# Patient Record
Sex: Female | Born: 1975 | Race: White | Hispanic: No | Marital: Married | Smoking: Never smoker
Health system: Southern US, Community
[De-identification: ages and names within clinical notes are randomized; demographics above are authoritative.]

## PROBLEM LIST (undated history)

## (undated) DIAGNOSIS — C801 Malignant (primary) neoplasm, unspecified: Secondary | ICD-10-CM

## (undated) DIAGNOSIS — E669 Obesity, unspecified: Secondary | ICD-10-CM

## (undated) DIAGNOSIS — Z923 Personal history of irradiation: Secondary | ICD-10-CM

## (undated) DIAGNOSIS — E079 Disorder of thyroid, unspecified: Secondary | ICD-10-CM

## (undated) DIAGNOSIS — C73 Malignant neoplasm of thyroid gland: Secondary | ICD-10-CM

## (undated) DIAGNOSIS — R5383 Other fatigue: Secondary | ICD-10-CM

## (undated) DIAGNOSIS — N841 Polyp of cervix uteri: Secondary | ICD-10-CM

## (undated) DIAGNOSIS — R001 Bradycardia, unspecified: Secondary | ICD-10-CM

## (undated) DIAGNOSIS — E890 Postprocedural hypothyroidism: Secondary | ICD-10-CM

## (undated) HISTORY — DX: Disorder of thyroid, unspecified: E07.9

## (undated) HISTORY — PX: TOTAL THYROIDECTOMY: SHX2547

## (undated) HISTORY — DX: Malignant (primary) neoplasm, unspecified: C80.1

## (undated) HISTORY — DX: Obesity, unspecified: E66.9

## (undated) HISTORY — DX: Malignant neoplasm of thyroid gland: C73

## (undated) HISTORY — DX: Polyp of cervix uteri: N84.1

## (undated) HISTORY — DX: Bradycardia, unspecified: R00.1

## (undated) HISTORY — DX: Personal history of irradiation: Z92.3

## (undated) HISTORY — DX: Postprocedural hypothyroidism: E89.0

## (undated) HISTORY — DX: Other fatigue: R53.83

---

## 1999-12-15 ENCOUNTER — Other Ambulatory Visit: Admission: RE | Admit: 1999-12-15 | Discharge: 1999-12-15 | Payer: Self-pay | Admitting: Obstetrics & Gynecology

## 2001-01-08 ENCOUNTER — Other Ambulatory Visit: Admission: RE | Admit: 2001-01-08 | Discharge: 2001-01-08 | Payer: Self-pay | Admitting: Obstetrics & Gynecology

## 2002-02-20 ENCOUNTER — Other Ambulatory Visit: Admission: RE | Admit: 2002-02-20 | Discharge: 2002-02-20 | Payer: Self-pay | Admitting: Obstetrics & Gynecology

## 2003-02-24 ENCOUNTER — Other Ambulatory Visit: Admission: RE | Admit: 2003-02-24 | Discharge: 2003-02-24 | Payer: Self-pay | Admitting: Obstetrics & Gynecology

## 2008-01-17 ENCOUNTER — Ambulatory Visit: Payer: Self-pay | Admitting: Gastroenterology

## 2008-03-06 ENCOUNTER — Ambulatory Visit (HOSPITAL_COMMUNITY): Admission: RE | Admit: 2008-03-06 | Discharge: 2008-03-06 | Payer: Self-pay | Admitting: Gastroenterology

## 2008-03-06 ENCOUNTER — Ambulatory Visit: Payer: Self-pay | Admitting: Gastroenterology

## 2009-01-25 ENCOUNTER — Ambulatory Visit (HOSPITAL_COMMUNITY): Admission: RE | Admit: 2009-01-25 | Discharge: 2009-01-25 | Payer: Self-pay

## 2009-02-22 ENCOUNTER — Ambulatory Visit (HOSPITAL_COMMUNITY): Admission: RE | Admit: 2009-02-22 | Discharge: 2009-02-22 | Payer: Self-pay | Admitting: General Surgery

## 2009-02-22 ENCOUNTER — Encounter (INDEPENDENT_AMBULATORY_CARE_PROVIDER_SITE_OTHER): Payer: Self-pay | Admitting: Interventional Radiology

## 2010-01-26 ENCOUNTER — Ambulatory Visit (HOSPITAL_COMMUNITY): Admission: RE | Admit: 2010-01-26 | Discharge: 2010-01-26 | Payer: Self-pay | Admitting: General Surgery

## 2010-03-15 ENCOUNTER — Encounter: Admission: RE | Admit: 2010-03-15 | Discharge: 2010-03-15 | Payer: Self-pay | Admitting: General Surgery

## 2010-03-15 ENCOUNTER — Other Ambulatory Visit: Admission: RE | Admit: 2010-03-15 | Discharge: 2010-03-15 | Payer: Self-pay | Admitting: Interventional Radiology

## 2010-05-19 ENCOUNTER — Ambulatory Visit (HOSPITAL_COMMUNITY)
Admission: RE | Admit: 2010-05-19 | Discharge: 2010-05-20 | Payer: Self-pay | Source: Home / Self Care | Attending: General Surgery | Admitting: General Surgery

## 2010-05-19 ENCOUNTER — Encounter (INDEPENDENT_AMBULATORY_CARE_PROVIDER_SITE_OTHER): Payer: Self-pay | Admitting: General Surgery

## 2010-05-27 ENCOUNTER — Ambulatory Visit: Payer: Self-pay | Admitting: "Endocrinology

## 2010-06-03 ENCOUNTER — Ambulatory Visit (HOSPITAL_COMMUNITY)
Admission: RE | Admit: 2010-06-03 | Discharge: 2010-06-03 | Payer: Self-pay | Source: Home / Self Care | Attending: "Endocrinology | Admitting: "Endocrinology

## 2010-06-14 ENCOUNTER — Ambulatory Visit
Admission: RE | Admit: 2010-06-14 | Discharge: 2010-06-14 | Payer: Self-pay | Source: Home / Self Care | Attending: "Endocrinology | Admitting: "Endocrinology

## 2010-06-21 ENCOUNTER — Encounter (HOSPITAL_COMMUNITY)
Admission: RE | Admit: 2010-06-21 | Discharge: 2010-07-12 | Payer: Self-pay | Source: Home / Self Care | Attending: "Endocrinology | Admitting: "Endocrinology

## 2010-06-27 ENCOUNTER — Ambulatory Visit (HOSPITAL_COMMUNITY)
Admission: RE | Admit: 2010-06-27 | Discharge: 2010-06-27 | Payer: Self-pay | Source: Home / Self Care | Attending: "Endocrinology | Admitting: "Endocrinology

## 2010-06-27 LAB — HCG, SERUM, QUALITATIVE: Preg, Serum: NEGATIVE

## 2010-06-29 LAB — HCG, SERUM, QUALITATIVE: Preg, Serum: NEGATIVE

## 2010-07-06 ENCOUNTER — Encounter (HOSPITAL_COMMUNITY)
Admission: RE | Admit: 2010-07-06 | Discharge: 2010-07-12 | Payer: Self-pay | Source: Home / Self Care | Attending: "Endocrinology | Admitting: "Endocrinology

## 2010-07-13 ENCOUNTER — Encounter: Payer: Self-pay | Admitting: "Endocrinology

## 2010-07-20 ENCOUNTER — Ambulatory Visit (INDEPENDENT_AMBULATORY_CARE_PROVIDER_SITE_OTHER): Payer: Commercial Managed Care - PPO | Admitting: "Endocrinology

## 2010-07-20 DIAGNOSIS — R5381 Other malaise: Secondary | ICD-10-CM

## 2010-07-20 DIAGNOSIS — C73 Malignant neoplasm of thyroid gland: Secondary | ICD-10-CM

## 2010-07-20 DIAGNOSIS — I498 Other specified cardiac arrhythmias: Secondary | ICD-10-CM

## 2010-07-20 DIAGNOSIS — E890 Postprocedural hypothyroidism: Secondary | ICD-10-CM

## 2010-08-23 LAB — POCT I-STAT 4, (NA,K, GLUC, HGB,HCT)
Glucose, Bld: 90 mg/dL (ref 70–99)
HCT: 44 % (ref 36.0–46.0)
Hemoglobin: 15 g/dL (ref 12.0–15.0)
Potassium: 3.6 mEq/L (ref 3.5–5.1)
Sodium: 141 mEq/L (ref 135–145)

## 2010-08-23 LAB — SURGICAL PCR SCREEN
MRSA, PCR: NEGATIVE
Staphylococcus aureus: NEGATIVE

## 2010-08-23 LAB — COMPREHENSIVE METABOLIC PANEL
ALT: 12 U/L (ref 0–35)
AST: 18 U/L (ref 0–37)
Albumin: 4 g/dL (ref 3.5–5.2)
Alkaline Phosphatase: 75 U/L (ref 39–117)
BUN: 7 mg/dL (ref 6–23)
CO2: 27 mEq/L (ref 19–32)
Calcium: 9.3 mg/dL (ref 8.4–10.5)
Chloride: 105 mEq/L (ref 96–112)
Creatinine, Ser: 0.85 mg/dL (ref 0.4–1.2)
GFR calc Af Amer: >60 mL/min (ref >60)
GFR calc non Af Amer: >60 mL/min (ref >60)
Glucose, Bld: 80 mg/dL (ref 70–99)
Potassium: 3.9 mEq/L (ref 3.5–5.1)
Sodium: 138 mEq/L (ref 135–145)
Total Bilirubin: 0.7 mg/dL (ref 0.3–1.2)
Total Protein: 7.6 g/dL (ref 6.0–8.3)

## 2010-08-23 LAB — PREGNANCY, URINE: Preg Test, Ur: NEGATIVE

## 2010-08-23 LAB — CALCIUM: Calcium: 9.1 mg/dL (ref 8.4–10.5)

## 2010-10-03 ENCOUNTER — Encounter: Payer: Self-pay | Admitting: *Deleted

## 2010-10-03 ENCOUNTER — Other Ambulatory Visit: Payer: Self-pay | Admitting: *Deleted

## 2010-10-03 DIAGNOSIS — C73 Malignant neoplasm of thyroid gland: Secondary | ICD-10-CM

## 2010-10-03 DIAGNOSIS — E890 Postprocedural hypothyroidism: Secondary | ICD-10-CM

## 2010-10-19 ENCOUNTER — Encounter (INDEPENDENT_AMBULATORY_CARE_PROVIDER_SITE_OTHER): Payer: Self-pay | Admitting: General Surgery

## 2010-10-25 ENCOUNTER — Encounter: Payer: Self-pay | Admitting: "Endocrinology

## 2010-10-25 ENCOUNTER — Ambulatory Visit (INDEPENDENT_AMBULATORY_CARE_PROVIDER_SITE_OTHER): Payer: Commercial Managed Care - PPO | Admitting: "Endocrinology

## 2010-10-25 VITALS — BP 113/74 | HR 60 | Wt 188.1 lb

## 2010-10-25 DIAGNOSIS — R5383 Other fatigue: Secondary | ICD-10-CM | POA: Insufficient documentation

## 2010-10-25 DIAGNOSIS — E890 Postprocedural hypothyroidism: Secondary | ICD-10-CM | POA: Insufficient documentation

## 2010-10-25 DIAGNOSIS — R5381 Other malaise: Secondary | ICD-10-CM

## 2010-10-25 DIAGNOSIS — R001 Bradycardia, unspecified: Secondary | ICD-10-CM | POA: Insufficient documentation

## 2010-10-25 DIAGNOSIS — I498 Other specified cardiac arrhythmias: Secondary | ICD-10-CM

## 2010-10-25 DIAGNOSIS — C73 Malignant neoplasm of thyroid gland: Secondary | ICD-10-CM

## 2010-10-25 DIAGNOSIS — E669 Obesity, unspecified: Secondary | ICD-10-CM

## 2010-10-25 NOTE — Patient Instructions (Signed)
Please have thyroid tests and thyroglobulin panel done about two weeks prior to next visit. Please continue Synthroid at current dose.

## 2010-10-25 NOTE — Progress Notes (Signed)
CC: FU papillary thyroid cancer, post-operative hypothyroidism, iatrogenic hyperthyroidism, fatigue, bradycardia, overweight/obesity  A. HPI: Betty Reyes is a 35 y.o. white woman who is accompanied by her husband. 1. Betty Reyes was diagnosed with hypothyroidism, presumably secondary to Hashimoto's Disease, in the past. She was started on Synthroid, 50 mcg per day and remained on that dose through 2011. 2. In the Summer of 2010 the nurse practioner for her PCP, Dr. Konrad Felix, noted a goiter. A thyroid US showed multiple nodules bilaterally, with the largest being 2.6 cm in diameter. Betty Reyes was referred to Dr. Almond Lint, a general surgeon, for further evaluation and management. Dr. Donell Beers performed a fine needle aspiration, which reportedly showed Hashimoto's Disease. A follow-up thyroid US in the Summer of 2011 showed that the previousl aspirated nodule and two other nodules had enlarged during the year. Dr. Donell Beers performed a total thyroidectomy on 12.18.11. 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 The largest microfocus was in the superior right lobe and did extend through the capsule.   3. 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 I met with her and her husband, reviewed her pathology, Dr. Arita Miss notes, and Betty Reyes' [place] reports and fine needle aspirate reports, and examined her. We also reviewed the latest clinical guidelines from the American Thyroid Association. We discussed the options for therapy, including whether or not to give radioactive iodine therapy (RAI). Although microfocal cancers usually have a low risk of recurrence or death, her case had two factors that would increase her risks.  First, the fact that she had multiple foci in both lobes. Second, the fact that one microfocus did extend through the thyroid capsule and presumably had drectly spread locally into her neck. I asked her to take oral contraceptives from that point until she either decided not to take RAI or until at least one year after receiving RAI 4. I then contacted Dr. Fuller Plan, MD, the chief of Endocrinology at the [facility], in [place], [place]. Dr. Cherly Anderson was my former Chief of Endocrinology at the [facility] and is a Contractor on thyroid cancer.  When I reviewed the case with him, Dr. Cherly Anderson agreed that the two factors mentioned above did increase her risk of recurrence. He stated that while some experts would suggest not doing RAI, other experts would suggest giving RAI at a dose of 50 millicuries.  I then met with Betty Reyes and her husband and we had a full and open discussion about the advantages and disadvantages of using RAI. Since the Hennises do not yet have children, and since neither of them wanted to leave a cancer untreated when a potentially curable treatment such as RAI was available,they wanted to proceed with RAI as soon as possible. Since Betty Reyes had not been on thyroid hormone replacement since surgery, she chose to receive RAI while undergoing thyroid hormone withdrawal and while following a strict low iodine diet. On 01.16.12, Betty Reyes received 50 millicuries of I-131. Her post-RAI scan showed uptake in the thyroid bed, but no spread outside the thyroid bed. As I explained to the Hennises, the I-131 uptake could have been  due to the presence of some residual thyroid cells, of some thyroid cancer cells, or both.  5. Betty Reyes' last clinic visit was on 02.18.12. At that time she had no evidence on physical exam of any residual cancer. In the interim she has continued to take Synthroid at a dose of 175 mcg per day, a dose that was adequately  suppressing her TSH.  6. PROS: Constitutional. The patient feels fine, is healthy overall, and has no significant complaints that pertain to today's visit. Energy: Energy level is good overall. Sleep: The patient usually sleeps well. There are no significant complaints of insomnia, frequent awakening, unusual restlessness, or poor sleep quality.  Body temperature: The patient's body temperature seems to be normal overall.  Weight: Weight has remained stable. There are no significant problems with unusual weight gain or loss. Eyes: The patient's vision is good. She is not aware of any eye problems. Neck: The patient is not aware of any lumps or masses in her anterior neck and thyroid bed. She does note occasional soreness at her surgical scar. There have been no significant other problems with  pressure, discomfort, or difficulty swallowing. Heart: The patient feels the expected increase in heart rate during exercise or other physical activities. There have been no significant problems with palpitations, irregular heart beats, chest pain, or chest pressure. Gastrointestinal: Stomach and intestines seem to be working normally. Bbwel movements are normal. There are no significant complaints of excessive hunger, acid reflux, upset stomach, stomach aches or pains, diarrhea, or constipation. Musculoskeletal: Muscles and extremities appear to be working normally. There are no significant problems with hand tremor, sweaty palms, palmar erythema, or lower leg swelling. Psychological: Mood and psychological responses seem to be normal. There have been no significant problems with sadness, depression, irritability, anger, or inappropriate responses to the actions of others. Mental: The patient's abilities to think, to pay attention, to remember, and to make decisions have normalized since being on Synthroid therapy. GYN: She remains on OCPs. Her LMP was about April 25th.  PMFSH: 1. Has resumed working  full-time as a Orthoptist. 2. Walks her dog regularly and does house work for exercise. 3. Will take a vacation in late may.  3. ROS: Betty Reyes had no significant problems related to any of her other eleven body systems  PHYSICAL EXAM: BP 113/74  Pulse 60  Wt 188 lb 1.6 oz (85.322 kg)  LMP 10/05/2010 Constitutional: The patient looks healthy and appears physically and emotionally well.  Eyes: There is no arcus or proptosis.  Mouth: The oropharynx appears normal. The tongue appears normal. There is normal oral moisture. There is no obvious gingivitis. Neck: There are no bruits present. The thyroid gland is absent. Thee is some induration and scarring in the left side of her thyroid bed over the strap muscle, but no evidence of masses or nodes in the thyroid bed or supraclavicular areas. There is no tenderness to palpation. Lungs: The lungs are clear. Air movement is good. Heart: The heart rhythm and rate appear normal. Heart sounds S1 and S2 are normal. I do not appreciate any pathologic heart murmurs. Abdomen: The abdominal size is enlarged, consistent with obesity. Bowel sounds are normal. The abdomen is soft and non-tender. There is no obviously palpable hepatomegaly, splenomegaly, or other masses.  Arms: Muscle mass appears appropriate for age.  Hands: There is no obvious tremor. Phalangeal and metacarpophalangeal joints appear normal. Palms are normal. Legs: Muscle mass appears appropriate for age. There is no edema.  Neurologic: Muscle strength is normal for age and gender  in both the upper and the lower extremities. Muscle tone appears normal. Sensation to touch is normal in the legs and feet.  Labs 03.19.12: Thyroglobulin (Tg) is < 0.2 (unmeasurable). Thyroglobulin antibody (TgAb) is < 20 (normal). TFTs show that her TSH is appropriately suppressed.  ASSESSMENT: 1. Papillary thyroid cancer: The most recent thyroglobulin concentration is below the detection range for this  sensitive assay. The normal TgAb indicates that there is nothing to interfere with the accuracy of the Tg assay. At this point it appears that Betty Reyes' PTC is in remission, but it is still possible that some PTC cells remain.  She will need continuing follow-up of her PTC for at least the next five years. 2. Hypothyroid: She remains on suppressive doses of Synthroid, which makes her hyperthyroid. Over time, if her physical exam and labs indicate that she continues to be in remission, we will slowly reduce the Synthroid dose. 3. Fatigue: The fatigue that she felt while undergoing thyroid hormone withdrawal was profound. She is essentially back to normal. 4. Bradycardia: This problem was due to profound hypothyroidism and has resolved as the thyroid hormone levels increased. 5. Obesity: With the resolution of her profound hypothyroidism she is beginning to exercise again. She wants to lose weight prior to becoming pregnant, something she wishes to do in 2013.   PLAN: 1. Fllow-up appointment in six months. 2. Obtain Tg panel and TFTs prior to next visit. 3. Continue Synthroid, 175 mcg/day.

## 2010-11-07 ENCOUNTER — Encounter: Payer: Self-pay | Admitting: "Endocrinology

## 2010-11-07 DIAGNOSIS — E669 Obesity, unspecified: Secondary | ICD-10-CM | POA: Insufficient documentation

## 2010-11-21 ENCOUNTER — Other Ambulatory Visit: Payer: Self-pay | Admitting: Obstetrics & Gynecology

## 2010-12-30 ENCOUNTER — Encounter (INDEPENDENT_AMBULATORY_CARE_PROVIDER_SITE_OTHER): Payer: Self-pay | Admitting: General Surgery

## 2011-01-02 ENCOUNTER — Encounter (INDEPENDENT_AMBULATORY_CARE_PROVIDER_SITE_OTHER): Payer: Self-pay | Admitting: General Surgery

## 2011-01-25 ENCOUNTER — Ambulatory Visit (INDEPENDENT_AMBULATORY_CARE_PROVIDER_SITE_OTHER): Payer: Commercial Managed Care - PPO | Admitting: General Surgery

## 2011-01-25 ENCOUNTER — Encounter (INDEPENDENT_AMBULATORY_CARE_PROVIDER_SITE_OTHER): Payer: Self-pay | Admitting: General Surgery

## 2011-01-25 DIAGNOSIS — C73 Malignant neoplasm of thyroid gland: Secondary | ICD-10-CM

## 2011-01-25 NOTE — Assessment & Plan Note (Signed)
Pt to see Dr. Fransico Michael in November and have labs before that. Plans to have uptake scan in Jan 2013. Would like to pursue childbearing in 2013. I will not order labs  I will follow her up in 1 year.

## 2011-01-25 NOTE — Progress Notes (Signed)
Betty Reyes is a 35 y.o. female.    Chief Complaint  Patient presents with  . Other    PO reck thy    HPI HPI Pt is doing well 8 months after total thyroidectomy.  Her voice is good.  She has undergone radioactive ablation.  She is not experiencing symptoms of hyperthyroidism or hypothyroidism.  She is back to work on [employer] at [employer].  She has no complaints about her incision.  She denies other symptoms.    Past Medical History  Diagnosis Date  . Thyroid disease     HYPOTHYROIDISM  . Cancer   . Thyroid cancer   . Hypothyroidism, postop   . Fatigue   . Bradycardia   . Obesity   . History of radioactive iodine thyroid ablation     Past Surgical History  Procedure Date  . Total thyroidectomy     Family History  Problem Relation Age of Onset  . Cancer Mother   . Hypertension Father   . Hyperlipidemia Father   . Heart disease Father     HEART ATTACK  . Cancer Brother   . Diabetes Maternal Grandfather   . Hypothyroidism Paternal Grandmother     Social History History  Substance Use Topics  . Smoking status: Never Smoker   . Smokeless tobacco: Never Used  . Alcohol Use: No    No Known Allergies  Current Outpatient Prescriptions  Medication Sig Dispense Refill  . levothyroxine (SYNTHROID, LEVOTHROID) 175 MCG tablet Take 175 mcg by mouth daily. Brand Name Synthroid Only       . Multiple Vitamin (MULTIVITAMIN) capsule Take 1 capsule by mouth daily.        Lorita Officer Triphasic (ORTHO TRI-CYCLEN LO PO) Take by mouth daily.        . Omega-3 Fatty Acids (FISH OIL PO) Take 1 tablet by mouth daily.          Review of Systems Review of Systems  All other systems reviewed and are negative.    Physical Exam Physical Exam  Constitutional: She is oriented to person, place, and time. She appears well-developed and well-nourished. No distress.  HENT:  Head: Atraumatic.  Mouth/Throat: No oropharyngeal exudate.  Eyes: Conjunctivae are normal. Pupils are  equal, round, and reactive to light. No scleral icterus.  Neck: Normal range of motion. Neck supple. No tracheal deviation present.       Collar incision fading nicely  Cardiovascular: Normal rate and intact distal pulses.   Respiratory: Effort normal. No respiratory distress. She exhibits no tenderness.  GI: Soft. She exhibits no distension and no mass. There is no tenderness. There is no rebound and no guarding.  Musculoskeletal: Normal range of motion. She exhibits no edema and no tenderness.  Lymphadenopathy:    She has no cervical adenopathy.  Neurological: She is alert and oriented to person, place, and time. Coordination normal.  Skin: Skin is warm and dry. No rash noted. She is not diaphoretic. No erythema. No pallor.       Sunburned   Psychiatric: She has a normal mood and affect. Her behavior is normal. Judgment and thought content normal.     There were no vitals taken for this visit.  Assessment/Plan Multifocal papillary thyroid cancer, s/p total thyroidectomy 05/2010 and Radioactive Iodine Ablation 06/2010 Pt to see Dr. Fransico Michael in November and have labs before that. Plans to have uptake scan in Jan 2013. Would like to pursue childbearing in 2013. I will not order labs  I will follow her up in 1 year.        Betty Reyes 01/25/2011, 1:50 PM

## 2011-02-20 ENCOUNTER — Other Ambulatory Visit: Payer: Self-pay | Admitting: "Endocrinology

## 2011-02-23 ENCOUNTER — Other Ambulatory Visit: Payer: Self-pay | Admitting: *Deleted

## 2011-04-05 LAB — TSH: TSH: 0.154 u[IU]/mL — ABNORMAL LOW (ref 0.350–4.500)

## 2011-04-05 LAB — T3, FREE: T3, Free: 3.1 pg/mL (ref 2.3–4.2)

## 2011-04-05 LAB — ANTI-THYROGLOBULIN ANTIBODY: Thyroglobulin Ab: <20 U/mL (ref <40.0)

## 2011-04-05 LAB — T4, FREE: Free T4: 1.57 ng/dL (ref 0.80–1.80)

## 2011-04-20 ENCOUNTER — Ambulatory Visit (INDEPENDENT_AMBULATORY_CARE_PROVIDER_SITE_OTHER): Payer: Commercial Managed Care - PPO | Admitting: "Endocrinology

## 2011-04-20 ENCOUNTER — Encounter: Payer: Self-pay | Admitting: "Endocrinology

## 2011-04-20 VITALS — BP 128/68 | HR 62 | Wt 188.9 lb

## 2011-04-20 DIAGNOSIS — E890 Postprocedural hypothyroidism: Secondary | ICD-10-CM

## 2011-04-20 DIAGNOSIS — C73 Malignant neoplasm of thyroid gland: Secondary | ICD-10-CM

## 2011-04-20 DIAGNOSIS — I498 Other specified cardiac arrhythmias: Secondary | ICD-10-CM

## 2011-04-20 DIAGNOSIS — E0580 Other thyrotoxicosis without thyrotoxic crisis or storm: Secondary | ICD-10-CM

## 2011-04-20 DIAGNOSIS — R001 Bradycardia, unspecified: Secondary | ICD-10-CM

## 2011-04-20 DIAGNOSIS — R5383 Other fatigue: Secondary | ICD-10-CM

## 2011-04-20 NOTE — Progress Notes (Addendum)
CC: FU papillary thyroid cancer, post-operative hypothyroidism, iatrogenic hyperthyroidism, fatigue, bradycardia, overweight/obesity  A. HPI: [name] is a 35 y.o. Caucasian woman who is accompanied by her husband. 1. [name] was diagnosed with hypothyroidism, presumably secondary to Hashimoto's Disease, in the past. She was treated with Synthroid, 50 mcg per day. In the Summer of 2010 a goiter was noted.  A thyroid US showed multiple nodules bilaterally, with the largest being 2.6 cm in diameter. [name] was referred to Dr. Almond Lint, a general surgeon, for further evaluation and management. Dr. Donell Reyes performed a fine needle aspiration, which reportedly showed Hashimoto's Disease. A follow-up thyroid US in the Summer of 2011 showed that the previousl aspirated nodule and two other nodules had enlarged during the year. Dr. Donell Reyes performed a total thyroidectomy on 12.18.11. 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 The largest microfocus was in the superior right lobe and did extend through the capsule.   2. I saw the patient in consultation on 12.16.11. Clinically she showed no evidence of active thyroid cancer. I then reviewed her surgical notes and pathology reports and the latest clinical guidelines on the management of thyroid cancer from the American Thyroid Assoc. with the patient and her husband. We discussed the options for therapy, including whether or not to give radioactive iodine therapy (RAI). Although microfocal cancers usually have a low risk of recurrence or death, her case had two factors that would increase her risks. First, the fact that she had multiple foci in both lobes. Second, the fact that one microfocus did extend through the thyroid capsule and presumably had drectly spread locally into her neck. I asked her to take oral contraceptives from that point until she either decided not to take RAI  or until at least one year after receiving RAI 3.  On 01.03.12 I met with [name] and her husband again and we had another full and open discussion about the advantages and disadvantages of using RAI. Since the Hennises do not yet have children, and since neither of them wanted to leave a cancer untreated when a potentially curable treatment such as RAI was available, they wanted to proceed with RAI as soon as possible. Since [name] had not been on thyroid hormone replacement since surgery, she chose to receive RAI while undergoing thyroid hormone withdrawal and while following a strict low iodine diet. On 01.16.12, [name] received 50 millicuries of I-131. Her post-RAI scan showed uptake in the thyroid bed, but no spread outside the thyroid bed. As I explained to the Betty Reyes, the I-131 uptake could have been due to the presence of some residual thyroid cells, or some thyroid cancer cells, or both.  4. The patient has done well clinically since her I-131 therapy. She has had no evidence of recurrence on physical exam .Her thyroglobulin (Tg) levels have progressively declined. On 12.21.11 the Tg was 20.8. On 01.03.12 the Tg was 2.9. She was hypothyroid for both of these Tg values. On 3.29.12 the Tg was <0.2. At that point [name] was taking Synthroid, 175 mcg/day. [name]' last clinic visit was on 05.15.12. In the interim she has been healthy. She remains on Synthroid, 175 mcg/day and oral contraception. 5. PROS: Constitutional. The patient feels "good". She has occasional headaches. She has no significant complaints that pertain to today's visit. Energy: Energy level is good overall. Sleep: The patient usually sleeps well. There are  no significant complaints of insomnia, frequent awakening, unusual restlessness, or poor sleep quality.  Body temperature: The patient's body temperature seems to be normal overall.  Weight: Weight has remained stable. There are no significant problems with  unusual weight gain or loss. Eyes: The patient's vision is good. She is not aware of any eye problems. Neck: The patient is not aware of any lumps or masses in her anterior neck and thyroid bed.  There have been no significant other problems with  pressure, discomfort, or difficulty swallowing. Heart: The patient feels the expected increase in heart rate during exercise or other physical activities. There have been no significant problems with palpitations, irregular heart beats, chest pain, or chest pressure. Gastrointestinal: Stomach and intestines seem to be working normally. Bbwel movements are normal. There are no significant complaints of excessive hunger, acid reflux, upset stomach, stomach aches or pains, diarrhea, or constipation. Musculoskeletal: Muscles and extremities appear to be working normally. There are no significant problems with hand tremor, sweaty palms, palmar erythema, or lower leg swelling. Psychological: Mood and psychological responses seem to be normal. There have been no significant problems with sadness, depression, irritability, anger, or inappropriate responses to the actions of others. Mental: The patient's abilities to think, to pay attention, to remember, and to make decisions have normalized since being on Synthroid therapy. GYN: She remains on OCPs. Her LMP is now.  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 2. Walks a lot with her husband. Goes to the park a lot. Walks her dog regularly. 3. Will take a vacation later this week..  3. ROS: [name] had no significant problems related to any of her other body systems  PHYSICAL EXAM: BP 128/68  Pulse 62  Wt 188 lb 15 oz (85.7 kg) Constitutional: The patient looks healthy and appears physically and emotionally well.  Eyes: There is no arcus or proptosis.  Mouth: The oropharynx appears normal. The tongue appears normal. There is normal oral moisture. There is no obvious gingivitis. Neck: There are no  bruits present. The thyroid gland is absent. There is some induration and scarring on both sides of her thyroid bed over the strap muscles, but less so than before. There is no evidence of masses or nodes in the thyroid bed or supraclavicular areas. There is no tenderness to palpation. Lungs: The lungs are clear. Air movement is good. Heart: The heart rhythm and rate appear normal. Heart sounds S1 and S2 are normal. I do not appreciate any pathologic heart murmurs. Abdomen: The abdominal size is enlarged, consistent with obesity. Bowel sounds are normal. The abdomen is soft and non-tender. There is no obviously palpable hepatomegaly, splenomegaly, or other masses.  Arms: Muscle mass appears appropriate for age.  Hands: There is no obvious tremor. Phalangeal and metacarpophalangeal joints appear normal. Palms are normal. Legs: Muscle mass appears appropriate for age. There is no edema.  Neurologic: Muscle strength is normal for age and gender  in both the upper and the lower extremities. Muscle tone appears normal. Sensation to touch is normal in the legs and feet.  Labs 10.23.12: No Thyroglobulin (Tg) is reported. Thyroglobulin antibody (TgAb) is < 20 (normal). TSH is 0.154, free T4 is 1.57, and free T3 is 3.1.  ASSESSMENT: 1. Papillary thyroid cancer: There is no clinical evidence of recurrence. Unfortunately, the lab did not do the Thyroglobulin assay that was requested. The most recent thyroglobulin concentration we have is from March. That Tg was below the detection range for this  sensitive assay. The normal TgAb in March and again in October indicates that there is nothing to interfere with the accuracy of the Tg assay. We will need to repeat the Tg assay today. At this point it appears that [name]' PTC is in remission, but it is still possible that some PTC cells remain.  She will need continuing follow-up of her PTC for at least the next five years. 2. Hypothyroid/hyperthyroid: She remains on  suppressive doses of Synthroid, which make her hyperthyroid. Over time, if her physical exam and labs indicate that she continues to be in remission, we will slowly reduce the Synthroid dose. 3. Fatigue: The fatigue that she felt while undergoing thyroid hormone withdrawal was profound. She is essentially back to normal. 4. Bradycardia: This problem was due to profound hypothyroidism and has resolved as the thyroid hormone levels increased. 5. Obesity: With the resolution of her profound hypothyroidism she is beginning to exercise again. She wants to lose weight prior to becoming pregnant, something she wishes to do in 2013.   PLAN: 1. Diagnostic: Repeat Tg today. 2. Therapeutic: Continue current dose of Synthroid. 3. Patient education: Patient and husband want to try for pregnancy in 2013. We need to repeat Tg unstimulated now. If OK, we want to do a thyrogen-stimulated Tg measurement in January.If that is also OK, then I will give patient clearance to stop OCPs and get on with bearing children.  4. Follow-up: Follow-up appointment in six months.  Level of Service: This visit lasted in excess of 40 minutes. More than 50% of the visit was devoted to counseling.  ADDENDUM 11.10.12: Thyroglobulin performed on 04/20/11 was again <0.2 (unmeasuarable). Tg antibody was again <20 (normal). There is no evidence of active thyroid cancer.

## 2011-04-20 NOTE — Patient Instructions (Signed)
Followup visit in 6 months. If you have not heard from [place] reference today's lab tests in 2 weeks, please call. We'll plan for her thyrogen-stimulated Tg measurement in January.

## 2011-04-21 LAB — THYROGLOBULIN LEVEL: Thyroglobulin Ab: <20 U/mL (ref <40.0)

## 2011-08-11 ENCOUNTER — Telehealth: Payer: Self-pay | Admitting: "Endocrinology

## 2011-08-11 DIAGNOSIS — C73 Malignant neoplasm of thyroid gland: Secondary | ICD-10-CM

## 2011-08-11 MED ORDER — THYROTROPIN ALFA 1.1 MG IM SOLR: 0.9000 mg | INTRAMUSCULAR | Status: DC

## 2011-08-11 NOTE — Telephone Encounter (Signed)
The patient contacted her pharmacy benefit manager, [employer] and was told that [employer] could ship Thyrogen to a local [facility]. Her co-pay would only be $80.00, without any other costs.  2.  That sounded too good to be true, so I called [employer], [phone] and spoke with Elnita Maxwell. Elnita Maxwell confirmed that the patient will have an $80.00 co-pay, but no deductible or other out of pocket expense. Elnita Maxwell approved the order of two vials of Thyrogen to be given by IM injections. She changed the dispensing site to the [facility]. 3. I called the [facility] and spoke to the pharmacist, Brett Canales. He confirmed that he could order the Thyrogen and will probably receive it early next week. I put the order into EPIC. I then called the [facility]. They had not yet received the e-prescription. I talked with Brett Canales again and gave him the prescription verbally. He thinks that the Thyrogen might come in as early as Wednesday, but perhaps not until Friday. I asked him to call me on my cell when the medication arrives.  4. I called the patient and gave her the above information.David Stall

## 2011-08-15 ENCOUNTER — Telehealth: Payer: Self-pay | Admitting: "Endocrinology

## 2011-08-15 NOTE — Telephone Encounter (Signed)
Patient sent me an email with several questions. I called her and answered the questions. 1. She should continue to take her Synthroid as usual during the time she is receiving Thyrogen injections and getting her labs drawn. 2. She can have another nurse administer the Thyrogen injections IM on Thursday morning, 3/7 and Friday morning, 3/8, ideally between 7-9 AM.  3. I will call the lab tomorrow to ensure that we order the full Tg panel, since there is some confusion about how to order that in EP[facility].  4. She will call us tomorrow with the fax number for the Peds Ward. We can then fax the lab slip to her.  5. Labs will be drawn Monday morning, 3/11 at [facility] lab at [facility] between 7-9 AM.

## 2011-08-16 ENCOUNTER — Other Ambulatory Visit: Payer: Self-pay | Admitting: *Deleted

## 2011-08-16 DIAGNOSIS — C73 Malignant neoplasm of thyroid gland: Secondary | ICD-10-CM

## 2011-08-22 LAB — T4, FREE: Free T4: 1.73 ng/dL (ref 0.80–1.80)

## 2011-08-22 LAB — T3, FREE: T3, Free: 3.3 pg/mL (ref 2.3–4.2)

## 2011-08-22 LAB — THYROGLOBULIN ANTIBODY: Thyroglobulin Ab: <20 U/mL

## 2011-08-22 LAB — THYROGLOBULIN LEVEL: Thyroglobulin: <0.2 ng/mL (ref 0.0–55.0)

## 2011-08-22 LAB — TSH: TSH: 17.234 u[IU]/mL — ABNORMAL HIGH (ref 0.350–4.500)

## 2011-08-24 ENCOUNTER — Telehealth: Payer: Self-pay | Admitting: "Endocrinology

## 2011-08-24 NOTE — Telephone Encounter (Signed)
I contacted the patient with the results of her  Thyrogen-stimulated thyroglobulin panel and TFTs drawn on 08/21/11. Free T4 and free T3 were normal as expected. The TSH was elevated at 17.234, consistent with a Thyrogen effect. Her thyroglobulin was less than 0.2 ng/mL, which is below the lower limit of detection of the assay. Her thyroglobulin antibody was less than 20 U/mL, which is also below the lower level of detection of that assay. The patient shows no evidence of residual thyroid cancer based on her clinical exams and on this Thyrogen-stimulated thyroglobulin panel. She is cleared to move forward with trying to become pregnant. Her follow-up appointment will be in May as planned. David Stall

## 2011-10-23 ENCOUNTER — Encounter: Payer: Self-pay | Admitting: "Endocrinology

## 2011-10-23 ENCOUNTER — Ambulatory Visit (INDEPENDENT_AMBULATORY_CARE_PROVIDER_SITE_OTHER): Payer: Commercial Managed Care - PPO | Admitting: "Endocrinology

## 2011-10-23 VITALS — BP 123/74 | HR 59 | Wt 189.2 lb

## 2011-10-23 DIAGNOSIS — R5381 Other malaise: Secondary | ICD-10-CM

## 2011-10-23 DIAGNOSIS — R5383 Other fatigue: Secondary | ICD-10-CM

## 2011-10-23 DIAGNOSIS — C73 Malignant neoplasm of thyroid gland: Secondary | ICD-10-CM

## 2011-10-23 DIAGNOSIS — E669 Obesity, unspecified: Secondary | ICD-10-CM

## 2011-10-23 DIAGNOSIS — E0580 Other thyrotoxicosis without thyrotoxic crisis or storm: Secondary | ICD-10-CM

## 2011-10-23 DIAGNOSIS — E890 Postprocedural hypothyroidism: Secondary | ICD-10-CM

## 2011-10-23 LAB — T4, FREE: Free T4: 1.54 ng/dL (ref 0.80–1.80)

## 2011-10-23 NOTE — Progress Notes (Signed)
CC: FU papillary thyroid cancer, post-operative hypothyroidism, iatrogenic hyperthyroidism, fatigue, bradycardia, overweight/obesity  A. HPI: Betty Reyes is a 36 y.o. Caucasian woman who is unaccompanied today. 1. Betty Reyes was diagnosed some years ago with hypothyroidism, presumably secondary to Hashimoto's Disease. She was treated with Synthroid, 50 mcg per day. In the Summer of 2010 a goiter was noted.  A thyroid US showed multiple nodules bilaterally, with the largest being 2.6 cm in diameter. Betty Reyes was referred to Dr. Almond Lint, a general surgeon, for further evaluation and management. Dr. Donell Beers performed a fine needle aspiration, which reportedly showed Hashimoto's Disease. A follow-up thyroid US in the Summer of 2011 showed that the previousl aspirated nodule and two other nodules had enlarged during the year. Dr. Donell Beers performed a total thyroidectomy on 12.18.11. 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 The largest microfocus was in the superior right lobe, measured 4 mm in diameter, invaded the capsule, but did not extend extrathyroidally.    2. I saw the patient and her husband in consultation on 12.16.11. Clinically she showed no evidence of active thyroid cancer. I then reviewed her surgical notes and pathology reports and the latest clinical guidelines on the management of thyroid cancer from the [employer] with the patient and her husband. We discussed the options for therapy, including whether or not to give radioactive iodine therapy (RAI). Although microfocal cancers usually have a low risk of recurrence or death, her case had two factors that would potentially increase her risks. First, the fact that she had multiple foci in both lobes. Second, the fact that one microfocus did invade the thyroid capsule and might have spread extrathyroidally, although the pathologist did not see  evidence of extrathyroidal extension on the slides he reviewed.  I asked the patient to take oral contraceptives from that point until she either decided not to take RAI or until at least one year after receiving RAI. She agreed. 3.  On 01.03.12 I met with Betty Reyes and her husband again and we had another full and open discussion about the advantages and disadvantages of using RAI. Since the Hennises do not yet have children, and since neither of them wanted to leave a cancer untreated when a potentially curable treatment such as RAI was available, they wanted to proceed with RAI as soon as possible. Since [name] had not been on thyroid hormone replacement since surgery, she chose to receive RAI while undergoing thyroid hormone withdrawal and while following a strict low iodine diet. On 01.16.12, [name] received 50 millicuries of I-131. Her post-RAI scan showed uptake in the thyroid bed, but no spread outside the thyroid bed. As I explained to the Betty Reyes, the I-131 uptake could have been due to the presence of some residual thyroid cells, or some thyroid cancer cells, or both.  4. The patient has done well clinically since her I-131 therapy. She has had no evidence of recurrence on physical exam. Her thyroglobulin (Tg) levels have progressively declined. On 12.21.11 the Tg was 20.8. On 01.03.12 the Tg was 2.9. She was hypothyroid for both of these Tg values. On 3.29.12 the Tg was <0.2. At that point Betty Reyes was taking Synthroid, 175 mcg/day.  5. [name] last clinic visit with me was on 04/20/11. In the interim she has been healthy. Her Thyrogen-stimulated Tg level on 08/21/11 was < 0.2, with Tg antibody being normal at <20.  I gave her the medical clearance to move forward with becoming pregnant. She remains on Synthroid, 175 mcg/day. She is also taking a prenatal multivitamin. 5. Pertinent Review of Systems: Constitutional. The patient feels "good". She has headaches only sporadically now. She  has no significant complaints that pertain to today's visit. Energy: Energy level is good overall. Sleep: The patient usually sleeps well. There are no significant complaints of insomnia, frequent awakening, unusual restlessness, or poor sleep quality.  Body temperature: The patient's body temperature seems to be normal overall.  Weight: Weight has remained stable. There are no significant problems with unusual weight gain or loss. Eyes: The patient's vision is good. She is not aware of any eye problems. Neck: The patient is not aware of any lumps or masses in her anterior neck and thyroid bed.  There have been no significant other problems with  pressure, discomfort, or difficulty swallowing. Heart: The patient feels the expected increase in heart rate during exercise or other physical activities. There have been no significant problems with palpitations, irregular heart beats, chest pain, or chest pressure. Gastrointestinal: Stomach and intestines seem to be working normally. Bowel movements are normal. There are no significant complaints of excessive hunger, acid reflux, upset stomach, stomach aches or pains, diarrhea, or constipation. Musculoskeletal: Muscles and extremities appear to be working normally. There are no significant problems with hand tremor, sweaty palms, palmar erythema, or lower leg swelling. Psychological: Mood and psychological responses seem to be normal. There have been no significant problems with sadness, depression, irritability, anger, or inappropriate responses to the actions of others. Mental: The patient's abilities to think, to pay attention, to remember, and to make decisions have normalized since being on Synthroid therapy. GYN: Her LMP was 10/03/11. She is seeing her OB now.   PAST MEDICAL, FAMILY, AND SOCIAL HISTORY: 1. Work and family: She works full-time as a Orthoptist at [employer]. 2. Activities: She walks a lot with her husband, goes to the park a lot, and  walks her dog regularly. 3. Primary care provider: Dr.Brad Thomas in [place] 4. OB/GYN: Dr. Genia Del, [facility] 5. Surgeon: Dr. Almond Lint, [facility]  3. REVIEW OF SYSTEMS: Betty Reyes has no significant problems related to any of her other body systems  PHYSICAL EXAM: BP 123/74  Pulse 59  Wt 189 lb 3.2 oz (85.821 kg) Constitutional: The patient looks healthy and appears physically and emotionally well.  Eyes: There is no arcus or proptosis.  Mouth: The oropharynx appears normal. The tongue appears normal. There is normal oral moisture. There is no obvious gingivitis. Neck: There are no bruits present. The thyroid gland is absent. There is some induration and scarring on both sides of her thyroid bed over the strap muscles. There is no evidence of masses or nodes in the thyroid bed or supraclavicular areas. There is no tenderness to palpation. Lungs: The lungs are clear. Air movement is good. Heart: The heart rhythm and rate appear normal. Heart sounds S1 and S2 are normal. I do not appreciate any pathologic heart murmurs. Abdomen: The abdominal size is enlarged, consistent with obesity. Bowel sounds are normal. The abdomen is soft and non-tender. There is no obviously palpable hepatomegaly, splenomegaly, or other masses.  Arms: Muscle mass appears appropriate for age.  Hands: There is no obvious tremor. Phalangeal and metacarpophalangeal joints appear normal. Palms are normal. Legs: Muscle mass appears appropriate for age. There is no edema.  Neurologic: Muscle strength is normal for age and  gender  in both the upper and the lower extremities. Muscle tone appears normal. Sensation to touch is normal in the legs.  Labs: 08/21/11: TSH 17.234, free T4 1.73, free T3 3.3, Thyrogen-stimulated thyroglobulin (Tg) < 0.2 (undetectable). Anti-thyroglobulin antibody < 20 (normal).          04/20/11: Thyroglobulin (Tg) < 0.2 (undetectable), thyroglobulin  antibody (TgAb) < 20           04/04/11: TSH 0.154, free T4 1.57, free T3 3.1  ASSESSMENT: 1. Papillary thyroid cancer: There is no clinical evidence of recurrence. As noted above,, her Thyrogen-stimulated Tg level in March of this year was < 0.2. She has no chemical evidence for recurrence. At this point it appears that Betty Reyes' PTC is in remission. She may well be cured, but she will need continuing follow-up of her PTC for at least the next five years. 2. Hypothyroid/hyperthyroid: We need to repeat her TFTs now, two months after her Thyrogen dose. Given her very normal Tg level in March, we can safely reduce her Synthroid dose and level of TSH suppression.  Over time, if her physical exam and labs continue to indicate that she remains in remission, we will continue to taper the Synthroid dose. 3. Fatigue: The fatigue that she felt while undergoing thyroid hormone withdrawal was profound. She is essentially back to normal. 4. Bradycardia: This problem was due to profound hypothyroidism and has resolved as the thyroid hormone levels increased. 5. Obesity: With the resolution of her profound hypothyroidism she is beginning to exercise again. She wants to lose weight prior to becoming pregnant, something she wishes to do in 2013.   PLAN: 1. Diagnostic: Repeat TFTs and unstimulated Tg panel today. Repeat TFTs and Tg panel in 6 months. If she becomes pregnant earlier, we will start the bimonthly measurement of TFTs.  2. Therapeutic: Continue current dose of Synthroid, but adjust as needed. 3. Patient education: We discussed the need to progressively increase Synthroid doses during pregnancy to compensate for placental deiodinase activity. Call me immediately when she becomes pregnant.  4. Follow-up: Follow-up appointment in six months.  Level of Service: This visit lasted in excess of 40 minutes. More than 50% of the visit was devoted to counseling.  David Stall

## 2011-10-23 NOTE — Patient Instructions (Signed)
Follow-up visit in 6 months. Please have lab tests repeated 2 weeks prior to next visit. Please call me as soon as you learn that you are pregnant.

## 2011-10-24 LAB — THYROGLOBULIN ANTIBODY: Thyroglobulin Ab: <20 U/mL (ref <40.0)

## 2011-10-24 LAB — THYROGLOBULIN LEVEL: Thyroglobulin: <0.2 ng/mL (ref 0.0–55.0)

## 2011-12-05 ENCOUNTER — Encounter (INDEPENDENT_AMBULATORY_CARE_PROVIDER_SITE_OTHER): Payer: Commercial Managed Care - PPO | Admitting: General Surgery

## 2011-12-08 ENCOUNTER — Encounter (INDEPENDENT_AMBULATORY_CARE_PROVIDER_SITE_OTHER): Payer: Commercial Managed Care - PPO | Admitting: General Surgery

## 2012-04-24 ENCOUNTER — Ambulatory Visit (INDEPENDENT_AMBULATORY_CARE_PROVIDER_SITE_OTHER): Payer: Commercial Managed Care - PPO | Admitting: "Endocrinology

## 2012-04-24 ENCOUNTER — Encounter: Payer: Self-pay | Admitting: "Endocrinology

## 2012-04-24 VITALS — BP 121/76 | HR 65 | Wt 188.5 lb

## 2012-04-24 DIAGNOSIS — R5381 Other malaise: Secondary | ICD-10-CM

## 2012-04-24 DIAGNOSIS — E0580 Other thyrotoxicosis without thyrotoxic crisis or storm: Secondary | ICD-10-CM

## 2012-04-24 DIAGNOSIS — R5383 Other fatigue: Secondary | ICD-10-CM

## 2012-04-24 DIAGNOSIS — E890 Postprocedural hypothyroidism: Secondary | ICD-10-CM

## 2012-04-24 DIAGNOSIS — C73 Malignant neoplasm of thyroid gland: Secondary | ICD-10-CM

## 2012-04-24 DIAGNOSIS — E669 Obesity, unspecified: Secondary | ICD-10-CM

## 2012-04-24 LAB — T4, FREE: Free T4: 1.74 ng/dL (ref 0.80–1.80)

## 2012-04-24 LAB — T3, FREE: T3, Free: 3.4 pg/mL (ref 2.3–4.2)

## 2012-04-24 NOTE — Progress Notes (Signed)
CC: FU papillary thyroid cancer, post-operative hypothyroidism, iatrogenic hyperthyroidism, fatigue, bradycardia, overweight/obesity  A. HPI: Ms. Reif is a 36 y.o. Caucasian woman who is accompanied by her husband, Nida Boatman, today. 1. Ms. Mauss was diagnosed some years ago with hypothyroidism, presumably secondary to Hashimoto's Disease. She was treated with Synthroid, 50 mcg per day. In the Summer of 2010 a goiter was noted.  A thyroid US showed multiple nodules bilaterally, with the largest being 2.6 cm in diameter. Ms. Veasy was referred to Dr. Almond Lint, a local general surgeon, for further evaluation and management. Dr. Donell Beers performed a fine needle aspiration, which reportedly showed Hashimoto's Disease. A follow-up thyroid US in the Summer of 2011 showed that the previously aspirated nodule and two other nodules had enlarged during the year. Dr. Donell Beers performed a total thyroidectomy on 05/29/10. 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 The largest microfocus was in the superior right lobe, measured 4 mm in diameter, invaded the capsule, but did not extend extrathyroidally.    2. I saw the patient and her husband in consultation on 05/27/10. Clinically she showed no evidence of active thyroid cancer. I then reviewed her surgical notes and pathology reports and the latest clinical guidelines on the management of thyroid cancer from the American [employer] with the patient and her husband. We discussed the options for therapy, including whether or not to give radioactive iodine therapy (RAI). Although microfocal cancers usually have a low risk of recurrence or death, her case had two factors that would potentially increase her risks. First, the fact that she had multiple foci in both lobes. Second, the fact that one microfocus did invade the thyroid capsule and might have spread extrathyroidally, although the  pathologist did not see evidence of extrathyroidal extension on the slides he reviewed.  I asked the patient to take oral contraceptives from that point until she either decided not to take RAI or until at least one year after receiving RAI. She agreed. 3.  On 06/14/10 I met with Ms. Puzzo and her husband again and we had another full and open discussion about the advantages and disadvantages of using RAI. Since the Hennises do not yet have children, and since neither of them wanted to leave a cancer untreated when a potentially curable treatment such as RAI was available, they wanted to proceed with RAI as soon as possible. Since Ms. Bolds had not been on thyroid hormone replacement since surgery, she chose to receive RAI while undergoing thyroid hormone withdrawal and while following a strict low iodine diet. On 06/27/10, Ms. Fehring received 50 millicuries of I-131. Her post-RAI scan showed uptake in the thyroid bed, but no spread outside the thyroid bed. As I explained to the Hennises, the I-131 uptake could have been due to the presence of some residual thyroid cells, or some thyroid cancer cells, or both.  4. The patient has done well clinically since her I-131 therapy. She has had no evidence of recurrence on physical exam. Her thyroglobulin (Tg) levels have progressively declined. On 06/01/10 the post-operative Tg level was 20.8. On 06/310 the pre-I-131 Tg level as 2.9. She was hypothyroid for both of these Tg values. On 09/08/10 the post-I-131 Tg level was <0.2. At that point Ms. Salvatierra was taking Synthroid, 175 mcg/day.  5. Ms Stacy' last clinic visit with me was on 15/13/13. In the interim she has been healthy. She occasionally has allergy-mediated left  ear fullness and mild vertigo. She and her husband have been trying to get pregnant, thus far without success. He is being followed in urology and is undergoing treatment with Clomid. She remains on Synthroid, 175 mcg/day. She is also taking a  prenatal multivitamin. 5. Pertinent Review of Systems: Constitutional. The patient feels "good". She has headaches only infrequently now. She has no significant complaints that pertain to today's visit. Energy: Energy level is good overall. Sleep: The patient usually sleeps well. There are no significant complaints of insomnia, frequent awakening, unusual restlessness, or poor sleep quality.  Body temperature: The patient's body temperature seems to be normal overall.  Weight: Weight has remained stable. There are no significant problems with unusual weight gain or loss. Eyes: The patient's vision is good. She is not aware of any eye problems. Neck: The patient is not aware of any lumps or masses in her anterior neck and thyroid bed.  There have been no significant other problems with  pressure, discomfort, or difficulty swallowing. Heart: The patient feels the expected increase in heart rate during exercise or other physical activities. There have been no significant problems with palpitations, irregular heart beats, chest pain, or chest pressure. Gastrointestinal: Stomach and intestines seem to be working normally. Bowel movements are normal. There are no significant complaints of excessive hunger, acid reflux, upset stomach, stomach aches or pains, diarrhea, or constipation. Musculoskeletal: Muscles and extremities appear to be working normally. There are no significant problems with hand tremor, sweaty palms, palmar erythema, or lower leg swelling. Psychological: Mood and psychological responses seem to be normal. There have been no significant problems with sadness, depression, irritability, anger, or inappropriate responses to the actions of others. Mental: The patient's abilities to think, to pay attention, to remember, and to make decisions have normalized since being on Synthroid therapy. GYN: Her LMP was 04/02/12. Menses have been regular. She is seeing her OB now.   PAST MEDICAL, FAMILY,  AND SOCIAL HISTORY: 1. Work and family: She works full-time as a Orthoptist at [employer]. 2. Activities: She walks a lot with her husband, goes to the park a lot, and walks her dog regularly. 3. Primary care provider: Dr.Brad Thomas in [place] 4. OB/GYN: Dr. Genia Del, [facility] 5. Surgeon: Dr. Almond Lint, [facility]  3. REVIEW OF SYSTEMS: Ms. Boutelle has no significant problems related to any of her other body systems  PHYSICAL EXAM: BP 121/76  Pulse 65  Wt 188 lb 8 oz (85.503 kg) She has lost one pound in the past six months.  Constitutional: The patient looks healthy and appears physically and emotionally well.  Eyes: There is no arcus or proptosis.  Mouth: The oropharynx appears normal. The tongue appears normal. There is normal oral moisture. There is no obvious gingivitis. Neck: There are no bruits present. The thyroid gland is absent. There is some induration and scarring on both sides of her thyroid bed over the strap muscles. There is no evidence of masses or nodes in the thyroid bed or supraclavicular areas. There is no tenderness to palpation. Lungs: The lungs are clear. Air movement is good. Heart: The heart rhythm and rate appear normal. Heart sounds S1 and S2 are normal. I do not appreciate any pathologic heart murmurs. Abdomen: The abdominal size is enlarged, consistent with obesity. Bowel sounds are normal. The abdomen is soft and non-tender. There is no obviously palpable hepatomegaly, splenomegaly, or other masses.  Arms: Muscle mass appears appropriate for age.  Hands:  There is no obvious tremor. Phalangeal and metacarpophalangeal joints appear normal. Palms are normal. Legs: Muscle mass appears appropriate for age. There is no edema.  Neurologic: Muscle strength is normal for age and gender  in both the upper and the lower extremities. Muscle tone appears normal. Sensation to touch is normal in the legs.  Labs:  10/23/11: TSH  0.214, free T4 1.54, free T3 3.2, thyroglobulin < 0.2, anti-thyroglobulin antibody < 20 08/21/11: TSH 17.234, free T4 1.73, free T3 3.3, Thyrogen-stimulated thyroglobulin (Tg) < 0.2 (undetectable). Anti-thyroglobulin antibody < 20 (normal).          04/20/11: Thyroglobulin (Tg) < 0.2 (undetectable), anti-thyroglobulin antibody (Tg Ab) < 20           04/04/11: TSH 0.154, free T4 1.57, free T3 3.1  ASSESSMENT: 1. Papillary thyroid cancer: There is no clinical evidence of recurrence. As noted above, her Thyrogen-stimulated Tg level in March of this year was < 0.2. Her non-stimulated Tg level in May was again undetectable. She has no chemical evidence for recurrence. At this point it appears that Ms. Morozov' PTC is in remission. She may well be cured, but she will need continuing follow-up of her PTC for at least the next five years. 2. Hypothyroid/hyperthyroid: We need to repeat her TFTs serially so that we can safely reduce her Synthroid dose and level of TSH suppression.  Over time, if her physical exam and labs continue to indicate that she remains in remission, we will continue to taper the Synthroid dose. On the other hand, however, once she becomes pregnant we will need to re-check her TFTs every 2 months and increase her Synthroid doses in order to overcome the increasing amounts of placental deiodinase enzyme that will occur as pregnancy progresses. Once the pregnancy ends and the placenta is delivered, she should be able to resume her pre-pregnancy doses of Synthroid.  3. Fatigue: The fatigue that she felt while undergoing thyroid hormone withdrawal was profound. She is essentially back to normal. 4. Bradycardia: This problem was due to profound hypothyroidism and has resolved as the thyroid hormone levels increased. 5. Obesity: With the resolution of her profound hypothyroidism she is beginning to exercise again. She wants to lose weight prior to becoming pregnant, something she wishes to do in  2013-2014.   PLAN: 1. Diagnostic: Repeat TFTs and unstimulated Tg panel today. Repeat TFTs and unstimulated Tg panel in 6 months. If she becomes pregnant earlier, we will start the bimonthly measurement of TFTs.  2. Therapeutic: Continue current dose of Synthroid, but adjust as needed. 3. Patient education: We discussed the need to progressively increase Synthroid doses during pregnancy to compensate for placental deiodinase activity. Call me immediately when she becomes pregnant.  4. Follow-up: Follow-up appointment in six months.  Level of Service: This visit lasted in excess of 40 minutes. More than 50% of the visit was devoted to counseling.  David Stall

## 2012-04-24 NOTE — Patient Instructions (Signed)
Follow up visit in 6 months. 

## 2012-04-25 LAB — THYROGLOBULIN LEVEL: Thyroglobulin: <0.2 ng/mL (ref 0.0–55.0)

## 2012-04-25 LAB — THYROGLOBULIN ANTIBODY: Thyroglobulin Ab: <20 U/mL (ref <40.0)

## 2012-04-26 ENCOUNTER — Other Ambulatory Visit: Payer: Self-pay | Admitting: *Deleted

## 2012-04-26 DIAGNOSIS — E038 Other specified hypothyroidism: Secondary | ICD-10-CM

## 2012-04-26 MED ORDER — SYNTHROID 175 MCG PO TABS: 175.0000 ug | ORAL_TABLET | Freq: Every day | ORAL | Status: DC

## 2012-05-17 ENCOUNTER — Other Ambulatory Visit: Payer: Self-pay | Admitting: *Deleted

## 2012-05-17 DIAGNOSIS — C73 Malignant neoplasm of thyroid gland: Secondary | ICD-10-CM

## 2012-06-27 LAB — TSH: TSH: 0.138 u[IU]/mL — ABNORMAL LOW (ref 0.350–4.500)

## 2012-06-27 LAB — T3, FREE: T3, Free: 3.7 pg/mL (ref 2.3–4.2)

## 2012-10-01 ENCOUNTER — Other Ambulatory Visit: Payer: Self-pay | Admitting: *Deleted

## 2012-10-01 DIAGNOSIS — C73 Malignant neoplasm of thyroid gland: Secondary | ICD-10-CM

## 2012-10-09 ENCOUNTER — Other Ambulatory Visit (HOSPITAL_COMMUNITY): Payer: Self-pay | Admitting: Obstetrics & Gynecology

## 2012-10-09 DIAGNOSIS — N970 Female infertility associated with anovulation: Secondary | ICD-10-CM

## 2012-10-15 ENCOUNTER — Ambulatory Visit (HOSPITAL_COMMUNITY)
Admission: RE | Admit: 2012-10-15 | Discharge: 2012-10-15 | Disposition: A | Discharge status: Home / Self Care | Payer: 59 | Source: Ambulatory Visit | Attending: Obstetrics & Gynecology | Admitting: Obstetrics & Gynecology

## 2012-10-15 DIAGNOSIS — N970 Female infertility associated with anovulation: Secondary | ICD-10-CM

## 2012-10-15 DIAGNOSIS — N979 Female infertility, unspecified: Secondary | ICD-10-CM | POA: Insufficient documentation

## 2012-10-15 LAB — TSH: TSH: 0.312 u[IU]/mL — ABNORMAL LOW (ref 0.350–4.500)

## 2012-10-15 MED ORDER — IOHEXOL 300 MG/ML  SOLN
20.0000 mL | Freq: Once | INTRAMUSCULAR | Status: AC | PRN
  Administered 2012-10-15: 20 mL

## 2012-10-16 LAB — THYROGLOBULIN ANTIBODY PANEL: Thyroglobulin: <0.2 ng/mL (ref 0.0–55.0)

## 2012-10-17 ENCOUNTER — Telehealth: Payer: Self-pay | Admitting: "Endocrinology

## 2012-10-17 NOTE — Telephone Encounter (Signed)
I called the patient at work to notify her of her recent thyroid lab results. Her thyroglobulin and thyroglobulin antibody remain unmeasurable. Her TSH is still suppressed, but not as suppressed. Her TFTs overall are mildly hyperthyroid, just where they should be.  David Stall

## 2012-10-17 NOTE — Telephone Encounter (Signed)
I notified the patient tht her lab results from 10/15/12 look good. Her thyroglobulin remaind

## 2012-10-22 ENCOUNTER — Ambulatory Visit: Payer: Commercial Managed Care - PPO | Admitting: "Endocrinology

## 2012-10-31 ENCOUNTER — Encounter: Payer: Self-pay | Admitting: "Endocrinology

## 2012-10-31 ENCOUNTER — Ambulatory Visit (INDEPENDENT_AMBULATORY_CARE_PROVIDER_SITE_OTHER): Payer: Commercial Managed Care - PPO | Admitting: "Endocrinology

## 2012-10-31 VITALS — BP 127/79 | HR 73 | Wt 192.8 lb

## 2012-10-31 DIAGNOSIS — E890 Postprocedural hypothyroidism: Secondary | ICD-10-CM

## 2012-10-31 DIAGNOSIS — C73 Malignant neoplasm of thyroid gland: Secondary | ICD-10-CM

## 2012-10-31 DIAGNOSIS — E669 Obesity, unspecified: Secondary | ICD-10-CM | POA: Insufficient documentation

## 2012-10-31 DIAGNOSIS — E0580 Other thyrotoxicosis without thyrotoxic crisis or storm: Secondary | ICD-10-CM

## 2012-10-31 NOTE — Patient Instructions (Signed)
Follow up visit in 6 months. Call Dr. Fransico Yonael Tulloch when you become pregnant.

## 2012-10-31 NOTE — Progress Notes (Signed)
CC: FU papillary thyroid cancer, post-operative hypothyroidism, iatrogenic hyperthyroidism, fatigue, bradycardia, overweight/obesity  A. HPI: [name] is a 37 y.o. Caucasian woman who is unaccompanied today.  1. [name] was diagnosed some years ago with hypothyroidism, presumably secondary to Hashimoto's Disease. She was treated with Synthroid, 50 mcg per day. In the Summer of 2010 a goiter was noted.  A thyroid US showed multiple nodules bilaterally, with the largest being 2.6 cm in diameter. [name] was referred to Dr. Almond Lint, a local general surgeon, for further evaluation and management. Dr. Donell Beers performed a fine needle aspiration, which reportedly showed Hashimoto's Disease. A follow-up thyroid US in the Summer of 2011 showed that the previously aspirated nodule and two other nodules had enlarged during the year. Dr. Donell Beers performed a total thyroidectomy on 05/29/10. 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 The largest microfocus was in the superior right lobe, measured 4 mm in diameter, invaded the capsule, but did not extend extrathyroidally.     2. I saw the patient and her husband in consultation on 05/27/10. Clinically she showed no evidence of active thyroid cancer. I then reviewed her surgical notes and pathology reports and the latest clinical guidelines on the management of thyroid cancer from the [employer] Association with the patient and her husband. We discussed the options for therapy, including whether or not to give radioactive iodine therapy (RAI). Although microfocal cancers usually have a low risk of recurrence or death, her case had two factors that would potentially increase her risks. First, the fact that she had multiple foci in both lobes. Second, the fact that one microfocus did invade the thyroid capsule and might have spread extrathyroidally, although the pathologist did  not see evidence of extrathyroidal extension on the slides he reviewed.  I asked the patient to take oral contraceptives from that point until she either decided not to take RAI or until at least one year after receiving RAI. She agreed.  3.  On 06/14/10 I met with [name] and her husband again and we had another full and open discussion about the advantages and disadvantages of using RAI. Since the Hennises do not yet have children, and since neither of them wanted to leave a cancer untreated when a potentially curable treatment such as RAI was available, they wanted to proceed with RAI as soon as possible. Since [name] had not been on thyroid hormone replacement since surgery, she chose to receive RAI while undergoing thyroid hormone withdrawal and while following a strict low iodine diet. On 06/27/10, [name] received 50 millicuries of I-131. Her post-RAI scan showed uptake in the thyroid bed, but no spread outside the thyroid bed. As I explained to the Betty Reyes, the I-131 uptake could have been due to the presence of some residual thyroid cells, or some thyroid cancer cells, or both.   4. The patient has done well clinically since her I-131 therapy. She has had no evidence of recurrence on physical exam. Her thyroglobulin (Tg) levels have progressively declined. On 06/01/10 the post-operative Tg level was 20.8. On 06/310 the pre-I-131 Tg level as 2.9. She was hypothyroid for both of these Tg values. On 09/08/10 the post-I-131 Tg level was <0.2. At that point [name] was taking Synthroid, 175 mcg/day.   5. [name] last clinic visit with me was on 04/24/12. In the interim she has been healthy. Her allergies have not  been bothering her much this season. She and her husband have been trying to get pregnant, thus far without success. He is being followed in urology and is undergoing treatment with Clomid. She will begin Clomid therapy soon as well. She remains on Synthroid, 175 mcg/day for 5  days of the week and on 1.5 of the 175 mcg pills on Wednesdays and Sundays. She is also taking a prenatal multivitamin.  6. Pertinent Review of Systems: Constitutional. The patient feels "good". She has headaches very infrequently now, less than once per month. She has no significant complaints that pertain to today's visit. Energy: Energy level is good overall. Sleep: The patient usually sleeps well. There are no significant complaints of insomnia, frequent awakening, unusual restlessness, or poor sleep quality.  Body temperature: The patient's body temperature seems to be normal overall.  Weight: Weight has increased 4 pounds in the past six months.  Eyes: The patient's vision is good. She is not aware of any eye problems. Neck: The patient is not aware of any lumps or masses in her anterior neck and thyroid bed.  There have been no significant other problems with  pressure, discomfort, or difficulty swallowing. Heart: The patient feels the expected increase in heart rate during exercise or other physical activities. There have been no significant problems with palpitations, irregular heart beats, chest pain, or chest pressure. Gastrointestinal: Stomach and intestines seem to be working normally. Bowel movements are normal. There are no significant complaints of excessive hunger, acid reflux, upset stomach, stomach aches or pains, diarrhea, or constipation. Musculoskeletal: Muscles and extremities appear to be working normally. There are no significant problems with hand tremor, sweaty palms, palmar erythema, or lower leg swelling. Psychological: Mood and psychological responses seem to be normal. There have been no significant problems with sadness, depression, irritability, anger, or inappropriate responses to the actions of others. Mental: The patient's abilities to think, to pay attention, to remember, and to make decisions have normalized since being on Synthroid therapy. GYN: Her LMP was  10/09/12. Menses have been regular. She is seeing her OB now.   PAST MEDICAL, FAMILY, AND SOCIAL HISTORY: 1. Work and family: She works full-time as a Orthoptist at [employer]. 2. Activities: She walks a lot with her husband, goes to the park a lot, and walks her dog regularly. 3. Primary care provider: Dr.Brad Thomas in [place] 4. OB/GYN: Dr. Genia Del, [facility] 5. Surgeon: Dr. Almond Lint, [facility]  3. REVIEW OF SYSTEMS: Ms. Sitton has no significant problems related to any of her other body systems  PHYSICAL EXAM: BP 127/79  Pulse 73  Wt 192 lb 12.8 oz (87.454 kg)  BMI 31.13 kg/m2  LMP 10/09/2012 She has gained four pounds in the past six months.  Constitutional: The patient looks healthy and appears physically and emotionally well.  Eyes: There is no arcus or proptosis.  Mouth: The oropharynx appears normal. The tongue appears normal. There is normal oral moisture. There is no obvious gingivitis. Neck: There are no bruits present. The thyroid gland is absent. There is some induration and scarring on the left side of her thyroid bed over the strap muscles. The previous induration over the right strap muscles has resolved. There is no evidence of masses or nodes in the thyroid bed or supraclavicular areas. There is no tenderness to palpation. Lungs: The lungs are clear. Air movement is good. Heart: The heart rhythm and rate appear normal. Heart sounds S1 and S2 are normal.  I do not appreciate any pathologic heart murmurs. Abdomen: The abdominal size is enlarged, consistent with obesity. Bowel sounds are normal. The abdomen is soft and non-tender. There is no obviously palpable hepatomegaly, splenomegaly, or other masses.  Arms: Muscle mass appears appropriate for age.  Hands: There is no obvious tremor. Phalangeal and metacarpophalangeal joints appear normal. Palms are normal. Legs: Muscle mass appears appropriate for age. There is no edema.   Neurologic: Muscle strength is normal for age and gender  in both the upper and the lower extremities. Muscle tone appears normal. Sensation to touch is normal in the legs.  Labs:  10/01/12: TSH 0.312, free T4 1.66, free T3 3.0, TPO antibody 44.2, thyroglobulin < 0.2, anti-thyroglobulin antibody < 20. 10/23/11: TSH 0.214, free T4 1.54, free T3 3.2, thyroglobulin < 0.2, anti-thyroglobulin antibody < 20 08/21/11: TSH 17.234, free T4 1.73, free T3 3.3, Thyrogen-stimulated thyroglobulin (Tg) < 0.2 (undetectable). Anti-thyroglobulin antibody < 20 (normal).          04/20/11: Thyroglobulin (Tg) < 0.2 (undetectable), anti-thyroglobulin antibody (Tg Ab) < 20           04/04/11: TSH 0.154, free T4 1.57, free T3 3.1  ASSESSMENT: 1. Papillary thyroid cancer: There is no clinical evidence of recurrence. As noted above, her Thyrogen-stimulated thyroglobulin (Tg) level in March of 2013 was < 0.2. Her non-stimulated Tg level in May and November 2013 and again this April was again undetectable. She has no chemical evidence for recurrence. At this point it appears that Ms. Matuska' PTC is in remission. She may well be cured, but she will need continuing follow-up of her PTC for at least the next five years. 2. Hypothyroid/hyperthyroid: She is mildly hyperthyroid. Given her unmeasurable thyroglobulin levels, her current level of TSH suppression is good. She should remain on her current doses of Synthroid.  We need to repeat her TFTs serially so that we can safely reduce her Synthroid dose and level of TSH suppression.  Over time, if her physical exam and labs continue to indicate that she remains in remission, we will continue to taper the Synthroid dose. On the other hand, however, once she becomes pregnant we will need to re-check her TFTs every 2 months and increase her Synthroid doses in order to overcome the increasing amounts of placental deiodinase enzyme that will occur as pregnancy progresses. Once the pregnancy  ends and the placenta is delivered, she should be able to resume her pre-pregnancy doses of Synthroid.  3. Fatigue: The fatigue that she felt while undergoing thyroid hormone withdrawal was profound. She is essentially back to normal. 4. Bradycardia: This problem was due to profound hypothyroidism and has resolved as the thyroid hormone levels increased. 5. Obesity: With the resolution of her profound hypothyroidism she is exercising again, but perhaps not enough. She wants to lose weight prior to becoming pregnant, something she wishes to do in 2014.   PLAN: 1. Diagnostic: Repeat TFTs and unstimulated Tg panel in 6 months. If she becomes pregnant earlier, we will start the bimonthly measurement of TFTs.  2. Therapeutic: Continue current doses of Synthroid, but adjust as needed. 3. Patient education: We discussed the need to progressively increase Synthroid doses during pregnancy to compensate for placental deiodinase activity. Call me immediately when she becomes pregnant.  4. Follow-up: Follow-up appointment in six months.  Level of Service: This visit lasted in excess of 40 minutes. More than 50% of the visit was devoted to counseling.  David Stall

## 2012-11-06 ENCOUNTER — Ambulatory Visit: Payer: Commercial Managed Care - PPO | Admitting: "Endocrinology

## 2013-01-15 ENCOUNTER — Encounter: Payer: Self-pay | Admitting: Gastroenterology

## 2013-01-29 ENCOUNTER — Telehealth: Payer: Self-pay | Admitting: Gastroenterology

## 2013-01-29 NOTE — Telephone Encounter (Signed)
Pt will call when she is ready to schedule her colon she was advised to call the office if she has any problems or concerns before that time  FYI Dr Christella Hartigan pt has family history of colon cancer (brother).  Last colon 2009.

## 2013-01-30 NOTE — Telephone Encounter (Signed)
i agree, thanks 

## 2013-02-13 ENCOUNTER — Other Ambulatory Visit: Payer: Self-pay | Admitting: *Deleted

## 2013-02-13 DIAGNOSIS — C73 Malignant neoplasm of thyroid gland: Secondary | ICD-10-CM

## 2013-02-19 LAB — T4, FREE: Free T4: 1.35 ng/dL (ref 0.80–1.80)

## 2013-02-20 LAB — THYROGLOBULIN ANTIBODY PANEL
Thyroglobulin Ab: <20 U/mL (ref <40.0)
Thyroperoxidase Ab SerPl-aCnc: 40.5 IU/mL — ABNORMAL HIGH (ref <35.0)

## 2013-03-19 ENCOUNTER — Telehealth: Payer: Self-pay | Admitting: "Endocrinology

## 2013-03-19 NOTE — Telephone Encounter (Signed)
1. I contacted patient with her recent lab results. Her thyroglobulin and thyroglobulin antibody are still below the level of detection. Her TFTs are normal, however, no longer suppressed since after reducing her dose of Synthroid to 175 mcg/day. She needs to continue to take one 175 mcg pill per day for six days each week, but resume taking 1.5 of the 175 mcg pills one day of each week. Will re-check her TFTs and Tg in 2 months. 2. Thus far she and her husband have been unsuccessful in achieving pregnancy. She is ovulating well. 3. She will contact me as soon as she becomes pregnant. David Stall

## 2013-04-11 ENCOUNTER — Other Ambulatory Visit: Payer: Self-pay | Admitting: *Deleted

## 2013-04-11 DIAGNOSIS — C73 Malignant neoplasm of thyroid gland: Secondary | ICD-10-CM

## 2013-05-05 ENCOUNTER — Ambulatory Visit (INDEPENDENT_AMBULATORY_CARE_PROVIDER_SITE_OTHER): Payer: 59 | Admitting: "Endocrinology

## 2013-05-05 ENCOUNTER — Encounter: Payer: Self-pay | Admitting: "Endocrinology

## 2013-05-05 VITALS — BP 123/77 | HR 63 | Wt 190.0 lb

## 2013-05-05 DIAGNOSIS — C73 Malignant neoplasm of thyroid gland: Secondary | ICD-10-CM

## 2013-05-05 DIAGNOSIS — E890 Postprocedural hypothyroidism: Secondary | ICD-10-CM

## 2013-05-05 DIAGNOSIS — E669 Obesity, unspecified: Secondary | ICD-10-CM

## 2013-05-05 DIAGNOSIS — E0580 Other thyrotoxicosis without thyrotoxic crisis or storm: Secondary | ICD-10-CM

## 2013-05-05 NOTE — Progress Notes (Signed)
CC: FU papillary thyroid cancer, post-operative hypothyroidism, iatrogenic hyperthyroidism, fatigue, bradycardia, overweight/obesity  A. HPI: Betty Reyes is a 37 y.o. Caucasian woman who is accompanied today by her husband, Nida Boatman.  1. Betty Reyes was diagnosed some time prior to 2010 with hypothyroidism, presumably secondary to Hashimoto's Disease. She was treated with Synthroid, 50 mcg per day. In the Summer of 2010 a goiter was noted.  A thyroid US showed multiple nodules bilaterally, with the largest being 2.6 cm in diameter. Betty Reyes was referred to Dr. Almond Lint, a local general surgeon, for further evaluation and management. Dr. Donell Beers performed a fine needle aspiration, which reportedly showed Hashimoto's Disease. A follow-up thyroid US in the Summer of 2011 showed that the previously aspirated nodule and two other nodules had enlarged during the year. Dr. Donell Beers performed a total thyroidectomy on 05/19/10. 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 The largest microfocus was in the superior right lobe, measured 4 mm in diameter, invaded the capsule, but did not extend extrathyroidally.     2. I saw the patient and her husband in consultation on 05/27/10. Clinically she showed no evidence of active thyroid cancer. I then reviewed her surgical notes and pathology reports and the latest clinical guidelines on the management of thyroid cancer from the American Thyroid Association with the patient and her husband. We discussed the options for therapy, including whether or not to give radioactive iodine therapy (RAI). Although microfocal cancers usually have a low risk of recurrence or death, her case had two factors that would potentially increase her risks. First, the fact that she had multiple foci in both lobes. Second, the fact that one microfocus did invade the thyroid capsule and might have spread extrathyroidally,  although the pathologist did not see evidence of extrathyroidal extension on the slides he reviewed.  I asked the patient to take oral contraceptives from that point until she either decided not to take RAI or until at least one year after receiving RAI. She agreed.  3.  On 06/14/10 I met with Betty Reyes and her husband again and we had another full and open discussion about the advantages and disadvantages of using RAI. Since the Hennises do not yet have children, and since neither of them wanted to leave a cancer untreated when a potentially curable treatment such as RAI was available, they wanted to proceed with RAI as soon as possible. Since Ms. Duty had not been on thyroid hormone replacement since surgery, she chose to receive RAI while undergoing thyroid hormone withdrawal and while following a strict low iodine diet. On 06/27/10, Betty Reyes received 50 millicuries of I-131. Her post-RAI scan showed uptake in the thyroid bed, but no spread outside the thyroid bed. As I explained to the Hennises, the I-131 uptake could have been due to the presence of some residual thyroid cells, or some thyroid cancer cells, or both.   4. The patient has done well clinically since her I-131 therapy. She has had no evidence of recurrence on physical exam. Her thyroglobulin (Tg) levels have progressively declined. On 06/01/10 the post-operative Tg level was 20.8. On 06/310 the pre-I-131 Tg level as 2.9. She was hypothyroid for both of these Tg values. On 09/08/10 the post-I-131 Tg level was <0.2. At that point Ms. Bueche was taking Synthroid, 175 mcg/day. Her unstimulated thyroglobulin levels have remained < 0.2 ever since.  5. Ms Reyes' last clinic visit with  me was on 10/31/12. In the interim she has been healthy. Her allergies have not been bothering her much this season. She and her husband have been trying to get pregnant, thus far without success. She has had 4 intrauterine inseminations. She will find out this  week if the most recent insemination was successful. Her husband is being followed in urology and is undergoing treatment with Clomid. She remains on Synthroid, 175 mcg/day for 6 days of the week and on 1.5 of the 175 mcg pills on Sundays. She is also taking a prenatal multivitamin.  6. Pertinent Review of Systems: Constitutional. The patient feels "fine". She has headaches very infrequently now, less than once per month. She has no significant complaints that pertain to today's visit. Energy: Energy level is good overall. Sleep: The patient usually sleeps well. There are no significant complaints of insomnia, frequent awakening, unusual restlessness, or poor sleep quality.  Body temperature: The patient's body temperature seems to be normal overall, or perhaps a bit colder.  Weight: Weight has decreased 2 pounds in the past six months.  Eyes: The patient's vision is good. She is not aware of any eye problems. Neck: The patient is not aware of any lumps or masses in her anterior neck and thyroid bed.  There have been no significant other problems with  pressure, discomfort, or difficulty swallowing. Heart: The patient feels the expected increase in heart rate during exercise or other physical activities. There have been no significant problems with palpitations, irregular heart beats, chest pain, or chest pressure. Gastrointestinal: Stomach and intestines seem to be working normally. Bowel movements are normal. There are no significant complaints of excessive hunger, acid reflux, upset stomach, stomach aches or pains, diarrhea, or constipation. Musculoskeletal: Muscles and extremities appear to be working normally. There are no significant problems with hand tremor, sweaty palms, palmar erythema, or lower leg swelling. Psychological: Mood and psychological responses seem to be normal. There have been no significant problems with sadness, depression, irritability, anger, or inappropriate responses to the  actions of others. Mental: The patient's abilities to think, to pay attention, to remember, and to make decisions have normalized since being on Synthroid therapy. GYN: Her LMP was 04/12/13. Menses have been regular. She is seeing her OB now.   PAST MEDICAL, FAMILY, AND SOCIAL HISTORY: 1. Work and family: She works full-time as a Orthoptist at [employer]. 2. Activities: She walks a lot with her husband, goes to the park a lot, and walks her dog regularly. 3. Primary care provider: Dr.Brad Thomas in [place] 4. OB/GYN: Dr. Genia Del, [facility] 5. Surgeon: Dr. Almond Lint, [facility]  3. REVIEW OF SYSTEMS: Ms. Zachman has no significant problems related to any of her other body systems  PHYSICAL EXAM: BP 123/77  Pulse 63  Wt 190 lb (86.183 kg) She has lost 2 pounds in the past six months.  Constitutional: The patient looks healthy and appears physically and emotionally well.  Eyes: There is no arcus or proptosis.  Mouth: The oropharynx appears normal. The tongue appears normal. There is normal oral moisture. There is no obvious gingivitis. Neck: There are no bruits present. The thyroid gland is absent. There is some induration and scarring on the left side of her thyroid bed over the strap muscles. The previous induration over the right strap muscles has resolved. There is no evidence of masses or nodes in the thyroid bed or supraclavicular areas. There is no tenderness to palpation. Lungs: The lungs  are clear. Air movement is good. Heart: The heart rhythm and rate appear normal. Heart sounds S1 and S2 are normal. I do not appreciate any pathologic heart murmurs. Abdomen: The abdominal size is enlarged, consistent with obesity. Bowel sounds are normal. The abdomen is soft and non-tender. There is no obviously palpable hepatomegaly, splenomegaly, or other masses.  Arms: Muscle mass appears appropriate for age.  Hands: There is no obvious tremor.  Phalangeal and metacarpophalangeal joints appear normal. Palms are normal. Legs: Muscle mass appears appropriate for age. There is no edema.  Neurologic: Muscle strength is normal for age and gender  in both the upper and the lower extremities. Muscle tone appears normal. Sensation to touch is normal in the legs.  Labs:  02/19/13: TSH 1.734, free T4 1.35, free T3 3.2, TPO antibody 40.5, Tg antibody < 20, thyroglobulin < 0.2 10/01/12: TSH 0.312, free T4 1.66, free T3 3.0, TPO antibody 44.2, thyroglobulin < 0.2, anti-thyroglobulin antibody < 20. 10/23/11: TSH 0.214, free T4 1.54, free T3 3.2, thyroglobulin < 0.2, anti-thyroglobulin antibody < 20 08/21/11: TSH 17.234, free T4 1.73, free T3 3.3, Thyrogen-stimulated thyroglobulin (Tg) < 0.2 (undetectable). Anti-thyroglobulin antibody < 20 (normal). 04/20/11: Thyroglobulin (Tg) < 0.2 (undetectable), anti-thyroglobulin antibody (Tg Ab) < 20  04/04/11: TSH 0.154, free T4 1.57, free T3 3.1  ASSESSMENT: 1. Papillary thyroid cancer: There is no clinical evidence of recurrence. As noted above, her Thyrogen-stimulated thyroglobulin (Tg) level in March of 2013 was < 0.2. Her non-stimulated Tg level in May and November 2013 and again this April and September was again undetectable. She has no chemical evidence for recurrence. At this point it appears that Ms. Kawano' PTC is in remission. She may well be cured, but she will need continuing follow-up of her PTC for at least the next five years. 2. Hypothyroid/hyperthyroid: She was euthyroid. For some reason, perhaps because she was taking her MVI at the same time as her Synthroid, her TSH rose to the mid-normal range in September and her TSH suppression was lost. On 03/19/13 I called her, discussed the lab results from September, and asked her to take an extra 1/2 of a Synthroid 175 mcg pill per week. She is due to have her TFTs repeated in two weeks. We need to repeat her TFTs serially so that we can safely reduce her  Synthroid dose and level of TSH suppression.  Over time, if her physical exam and labs continue to indicate that she remains in remission, we will taper the Synthroid dose. On the other hand, however, once she becomes pregnant we will need to re-check her TFTs every 2 months and increase her Synthroid doses in order to overcome the increasing amounts of placental deiodinase enzyme that will occur as pregnancy progresses. Once the pregnancy ends and the placenta is delivered, she should be able to resume her pre-pregnancy doses of Synthroid.  3. Fatigue: The fatigue that she felt while undergoing thyroid hormone withdrawal was profound. She is essentially back to normal. 4. Bradycardia: This problem was due to profound hypothyroidism and has resolved as the thyroid hormone levels increased. 5. Obesity: With the resolution of her profound hypothyroidism she is exercising again. She has lost 2 pounds in the past 6 months. She wants to lose more weight prior to becoming pregnant, something she wishes to do in 2014-2015   PLAN: 1. Diagnostic: Repeat TFTs and unstimulated Tg panel in 6 months. If she becomes pregnant earlier, we will start the bimonthly measurement of TFTs.  2. Therapeutic:  Continue current doses of Synthroid, but adjust as needed. 3. Patient education: We discussed the need to progressively increase Synthroid doses during pregnancy to compensate for placental deiodinase activity. Call me immediately when she becomes pregnant.  4. Follow-up: Follow-up appointment in six months.  Level of Service: This visit lasted in excess of 30 minutes. More than 50% of the visit was devoted to counseling.  David Stall

## 2013-05-05 NOTE — Patient Instructions (Signed)
Follow up visit in 6 months. 

## 2013-05-20 LAB — T4, FREE: Free T4: 1.51 ng/dL (ref 0.80–1.80)

## 2013-05-20 LAB — T3, FREE: T3, Free: 3.1 pg/mL (ref 2.3–4.2)

## 2013-05-21 LAB — THYROGLOBULIN ANTIBODY PANEL: Thyroglobulin: <0.2 ng/mL (ref 0.0–55.0)

## 2013-06-17 ENCOUNTER — Other Ambulatory Visit: Payer: Self-pay | Admitting: "Endocrinology

## 2013-06-30 ENCOUNTER — Telehealth: Payer: Self-pay | Admitting: "Endocrinology

## 2013-06-30 ENCOUNTER — Telehealth: Payer: Self-pay | Admitting: *Deleted

## 2013-06-30 NOTE — Telephone Encounter (Signed)
1. Patient called earlier. Her infertility doctor, Dr. Marzetta Board,  checked her TSH on 06/27/13. The value was 0.085. She called to ask what she should do. 2. In retrospect, her TFTs on 05/30/13 were TSH 1.422, free T4 1.51, and free T3 3.8. Dr. Baldo Ash then increased her Synthroid from 1.5 of the 175 mcg pills one day per week and one pill per day on the other 6 days per week to 1.5 pills 3 days per week and one pill 4 days per week. Betty Reyes has also split her MVI and Synthroid, taking MVI in the AM and the Synthroid in PM. 3. Assessment: The TSH is too low now. The drop in TSH was probably partly due to splitting the timing of the MVI and Synthroid and partly due to increasing the Synthroid dose. We'd like to see a TSH of about 0.4-0.5.  4. Plan: For next 7 days, take only one 175 mcg Synthroid pill per day. After that week, take 1.5 of the 175 mcg pills one day per week and take one of the 175 mcg pills per day on the other 6 days of the week. Repeat TFTs in 2 months.  Sherrlyn Hock

## 2013-06-30 NOTE — Telephone Encounter (Signed)
Betty Reyes called and stated she saw fertility specialist who did a TSH and the level was 0.0085, she advises she has changed the way she takes her synthroid. She now takes it at night and her vitamin am. Please call her at [place], then at home. KW

## 2013-09-15 ENCOUNTER — Other Ambulatory Visit: Payer: Self-pay | Admitting: *Deleted

## 2013-09-15 DIAGNOSIS — E890 Postprocedural hypothyroidism: Secondary | ICD-10-CM

## 2013-09-19 LAB — TSH: TSH: 0.137 u[IU]/mL — ABNORMAL LOW (ref 0.350–4.500)

## 2013-09-19 LAB — T4, FREE: FREE T4: 1.7 ng/dL (ref 0.80–1.80)

## 2013-09-19 LAB — T3, FREE: T3 FREE: 3.4 pg/mL (ref 2.3–4.2)

## 2013-09-24 ENCOUNTER — Telehealth: Payer: Self-pay | Admitting: "Endocrinology

## 2013-09-24 NOTE — Telephone Encounter (Signed)
I called the patient to discuss her recent TFTs.

## 2013-10-01 ENCOUNTER — Telehealth: Payer: Self-pay | Admitting: "Endocrinology

## 2013-10-07 ENCOUNTER — Other Ambulatory Visit: Payer: Self-pay | Admitting: *Deleted

## 2013-10-07 DIAGNOSIS — E038 Other specified hypothyroidism: Secondary | ICD-10-CM

## 2013-10-13 NOTE — Telephone Encounter (Signed)
Handled by nurse. Emily M Hull °

## 2013-11-04 ENCOUNTER — Ambulatory Visit: Payer: 59 | Admitting: "Endocrinology

## 2013-11-09 ENCOUNTER — Encounter: Payer: Self-pay | Admitting: Gastroenterology

## 2013-11-12 ENCOUNTER — Encounter: Payer: Self-pay | Admitting: *Deleted

## 2013-11-19 LAB — TSH: TSH: 0.217 u[IU]/mL — ABNORMAL LOW (ref 0.350–4.500)

## 2013-11-19 LAB — T4, FREE: FREE T4: 1.73 ng/dL (ref 0.80–1.80)

## 2013-11-19 LAB — T3, FREE: T3 FREE: 3.4 pg/mL (ref 2.3–4.2)

## 2013-11-20 LAB — THYROGLOBULIN ANTIBODY PANEL: Thyroperoxidase Ab SerPl-aCnc: 31.9 IU/mL (ref <35.0)

## 2013-12-25 ENCOUNTER — Ambulatory Visit: Payer: 59 | Admitting: "Endocrinology

## 2014-02-04 LAB — HM PAP SMEAR: HM Pap smear: NEGATIVE

## 2014-03-10 ENCOUNTER — Other Ambulatory Visit: Payer: Self-pay | Admitting: *Deleted

## 2014-03-10 ENCOUNTER — Ambulatory Visit (INDEPENDENT_AMBULATORY_CARE_PROVIDER_SITE_OTHER): Payer: 59 | Admitting: "Endocrinology

## 2014-03-10 ENCOUNTER — Encounter: Payer: Self-pay | Admitting: "Endocrinology

## 2014-03-10 VITALS — BP 107/70 | HR 66 | Wt 195.0 lb

## 2014-03-10 DIAGNOSIS — E669 Obesity, unspecified: Secondary | ICD-10-CM

## 2014-03-10 DIAGNOSIS — C73 Malignant neoplasm of thyroid gland: Secondary | ICD-10-CM

## 2014-03-10 DIAGNOSIS — E0580 Other thyrotoxicosis without thyrotoxic crisis or storm: Secondary | ICD-10-CM

## 2014-03-10 DIAGNOSIS — R5381 Other malaise: Secondary | ICD-10-CM

## 2014-03-10 DIAGNOSIS — N979 Female infertility, unspecified: Secondary | ICD-10-CM

## 2014-03-10 DIAGNOSIS — R5383 Other fatigue: Secondary | ICD-10-CM

## 2014-03-10 DIAGNOSIS — E890 Postprocedural hypothyroidism: Secondary | ICD-10-CM

## 2014-03-10 DIAGNOSIS — L680 Hirsutism: Secondary | ICD-10-CM

## 2014-03-10 NOTE — Progress Notes (Signed)
CC: FU papillary thyroid cancer, post-operative hypothyroidism, iatrogenic hyperthyroidism, fatigue, bradycardia, overweight/obesity  A. HPI: Betty Reyes is a 38 y.o. Caucasian woman who is accompanied today by her husband, Leroy Sea.  1. Betty Reyes was diagnosed some time prior to 2010 with hypothyroidism, presumably secondary to Hashimoto's Disease. She was treated with Synthroid, 50 mcg per day. In the Summer of 2010 a goiter was noted.  A thyroid US showed multiple nodules bilaterally, with the largest being 2.6 cm in diameter. Ms. Muldrow was referred to Dr. Stark Klein, a local general surgeon, for further evaluation and management. Dr. Barry Dienes performed a fine needle aspiration, which reportedly showed Hashimoto's Disease. A follow-up thyroid US in the Summer of 2011 showed that the previously aspirated nodule and two other nodules had enlarged during the year. Dr. Barry Dienes performed a total thyroidectomy on 05/19/10. 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 The largest microfocus was in the superior right lobe, measured 4 mm in diameter, invaded the capsule, but did not extend extrathyroidally.     2. I saw the patient and her husband in consultation on 05/27/10. Clinically she showed no evidence of active thyroid cancer. I then reviewed her surgical notes and pathology reports and the latest clinical guidelines on the management of thyroid cancer from the [employer] with the patient and her husband. We discussed the options for therapy, including whether or not to give radioactive iodine therapy (RAI). Although microfocal cancers usually have a low risk of recurrence or death, her case had two factors that would potentially increase her risks. First, the fact that she had multiple foci in both lobes. Second, the fact that one microfocus did invade the thyroid capsule and might have spread extrathyroidally,  although the pathologist did not see evidence of extrathyroidal extension on the slides he reviewed.  I asked the patient to take oral contraceptives from that point until she either decided not to take RAI or until at least one year after receiving RAI. She agreed.  3.  On 06/14/10 I met with Betty Reyes and her husband again and we had another full and open discussion about the advantages and disadvantages of using RAI. Since the Hennises do not yet have children, and since neither of them wanted to leave a cancer untreated when a potentially curable treatment such as RAI was available, they wanted to proceed with RAI as soon as possible. Since Betty Reyes had not been on thyroid hormone replacement since surgery, she chose to receive RAI while undergoing thyroid hormone withdrawal and while following a strict low iodine diet. On 06/27/10, Betty Reyes received 50 millicuries of V-400. Her post-RAI scan showed uptake in the thyroid bed, but no spread outside the thyroid bed. As I explained to the Hennises, the I-131 uptake could have been due to the presence of some residual thyroid cells, or some thyroid cancer cells, or both.   4. The patient has done well clinically since her I-131 therapy. She has had no evidence of recurrence on physical exam. Her thyroglobulin (Tg) levels have progressively declined. On 06/01/10 the post-operative Tg level was 20.8. On 06/310 the pre-I-131 Tg level was 2.9. She was hypothyroid for both of these Tg values. On 09/08/10 the post-I-131 Tg level was <0.2. At that point Betty Reyes was taking Synthroid, 175 mcg/day. Her unstimulated thyroglobulin levels have remained < 0.2 ever since.  5. Ms Reyes' last clinic visit with  me was on 05/05/13. In the interim she has been healthy. Her allergies have not been bothering her much this season. She and her husband have been trying to get pregnant, thus far without success. She has had 4 intrauterine inseminations. Her last attempt at IVF  was unsuccessful. Ironically that attempt was performed with 2 embryos that formed when her husband's semen was mixed with her ova. Unfortunately, neither of these embryos successfully implanted in her uterus. She has two remaining embyros that are being cryogenically preserved. A repeat IVF procedure will be performed in October. Her husband has been followed in urology and is undergoing treatment with Clomid. She remains on Synthroid, 175 mcg/day, except for 1.5 of the 175 mcg pills every other Sunday. She is also taking a prenatal multivitamin.  6. Pertinent Review of Systems: Constitutional. The patient feels "good". Her headaches are now infrequent. She is emotionally stressed pending the outcome of her next IVF procedure. She has no significant complaints that pertain to today's visit. Energy: Energy level is good overall. Sleep: The patient usually sleeps well. There are no significant complaints of insomnia, frequent awakening, unusual restlessness, or poor sleep quality.  Body temperature: The patient's body temperature seems to be normal overall.  Weight: Weight has decreased 2 pounds in the past six months.  Eyes: The patient's vision is good. She is not aware of any eye problems. Neck: The patient is not aware of any lumps or masses in her anterior neck and thyroid bed.  There have been no significant other problems with  pressure, discomfort, or difficulty swallowing. Heart: The patient feels the expected increase in heart rate during exercise or other physical activities. There have been no significant problems with palpitations, irregular heart beats, chest pain, or chest pressure. Gastrointestinal: Stomach and intestines seem to be working normally. Bowel movements are normal. There are no significant complaints of excessive hunger, acid reflux, upset stomach, stomach aches or pains, diarrhea, or constipation. Musculoskeletal: Muscles and extremities appear to be working normally. There  are no significant problems with hand tremor, sweaty palms, palmar erythema, or lower leg swelling. Psychological: Mood and psychological responses seem to be otherwise normal.  Mental: The patient's abilities to think, to pay attention, to remember, and to make decisions have normalized since being on Synthroid therapy. GYN: Her LMP was 02/16/14. Menses have been regular.    PAST MEDICAL, FAMILY, AND SOCIAL HISTORY: 1. 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 2. Activities: She walks a lot with her husband, goes to the park a lot, and walks her dog regularly. 3. Primary care provider: Dr. Brad Thomas in [place] 4. OB/GYN: Dr. Marie-Lyne Lavoie, [facility] 5. Surgeon: Dr. Faera Byerly, [facility]  3. REVIEW OF SYSTEMS: Ms. Vahey has no significant problems related to any of her other body systems  PHYSICAL EXAM: BP 107/70  Pulse 66  Wt 195 lb (88.451 kg) She has gained 5 pounds in the past eight months.  Constitutional: The patient looks healthy and appears physically and emotionally well, but did get teary-eyed when we discussed the issue of embryo implantation. .  Eyes: There is no arcus or proptosis.  Mouth: The oropharynx appears normal. The tongue appears normal. There is normal oral moisture. There is no obvious gingivitis. She has a 1-2+ blond mustache of the upper lip. Neck: There are no bruits present. The thyroid gland is absent. There is some residual induration on the left side of her thyroid   bed over the strap muscles. The previous induration over the right strap muscles has resolved. There is no evidence of masses or nodes in the thyroid bed or supraclavicular areas. There is no tenderness to palpation. Lungs: The lungs are clear. Air movement is good. Heart: The heart rhythm and rate appear normal. Heart sounds S1 and S2 are normal. I do not appreciate any pathologic heart murmurs. Abdomen: The abdominal size is  enlarged, consistent with obesity. Bowel sounds are normal. The abdomen is soft and non-tender. There is no obviously palpable hepatomegaly, splenomegaly, or other masses.  Arms: Muscle mass appears appropriate for age.  Hands: There is no obvious tremor. Phalangeal and metacarpophalangeal joints appear normal. Palms are normal. Legs: Muscle mass appears appropriate for age. There is no edema.  Neurologic: Muscle strength is normal for age and gender  in both the upper and the lower extremities. Muscle tone appears normal. Sensation to touch is normal in the legs.  Labs:   11/19/13: TSH 0.217, free T4 1.73, free T3 3.4, thyroglobulin <0.2, thyroglobulin antibody < 20 09/18/13: TSH 0.137, free T4 1.70, free T3 3.4 05/30/13: TSH 1.422, free T4 1.51, free T3 3.1, Thyroglobulin <0.2, thyroglobulin antibody <20 02/19/13: TSH 1.734, free T4 1.35, free T3 3.2, TPO antibody 40.5, Tg antibody < 20, thyroglobulin < 0.2 10/01/12: TSH 0.312, free T4 1.66, free T3 3.0, TPO antibody 44.2, thyroglobulin < 0.2, anti-thyroglobulin antibody < 20. 10/23/11: TSH 0.214, free T4 1.54, free T3 3.2, thyroglobulin < 0.2, anti-thyroglobulin antibody < 20 08/21/11: TSH 17.234, free T4 1.73, free T3 3.3, Thyrogen-stimulated thyroglobulin (Tg) < 0.2 (undetectable). Anti-thyroglobulin antibody < 20 (normal). 04/20/11: Thyroglobulin (Tg) < 0.2 (undetectable), anti-thyroglobulin antibody (Tg Ab) < 20  04/04/11: TSH 0.154, free T4 1.57, free T3 3.1  ASSESSMENT: 1. Papillary thyroid cancer: There is no clinical evidence of recurrence. As noted above, her Thyrogen-stimulated thyroglobulin (Tg) levels have been unmeasurable since March 2012. She has no chemical evidence for recurrence. At this point it appears that Ms. Scherger' PTC is in remission. She may well be cured, but she will need continuing follow-up of her PTC for at least the next five years. 2. Hypothyroid/hyperthyroid: Her TSH hs been suppressed iatrogenically since April  2015. Over time, if her physical exam and labs continue to indicate that she remains in remission, we will taper the Synthroid dose. On the other hand, however, once she becomes pregnant we will need to re-check her TFTs every 2 months and increase her Synthroid doses in order to overcome the increasing amounts of placental deiodinase enzyme that will occur as pregnancy progresses. Once the pregnancy ends and the placenta is delivered, she should be able to resume her pre-pregnancy doses of Synthroid.  3. Fatigue: The fatigue that she felt while undergoing thyroid hormone withdrawal was profound. She is essentially back to normal. 4. Bradycardia: This problem was due to profound hypothyroidism and has resolved as the thyroid hormone levels increased. 5. Obesity: With the resolution of her profound hypothyroidism she was exercising more, but has since reduced her level of physical activity. As a result her weight has increased again. She has gained 5 pounds in the past 8 months. She wants to lose more weight prior to becoming pregnant, something she wishes to do in 2014-2015  6. Hirsutism: Her increase in fat cell weight has caused some increase in insulin resistance and a compensatory increase in insulin production. The hyperinsulinemia, in turn, has caused more production of testosterone from her ovaries and androstenedione from her   adrenal glands, resulting in more upper lip hair than before.  7. Infertility: I do not know of any reason that her embryos have not implanted. She will have her next IVF attempts in October.   PLAN: 1. Diagnostic: Repeat TFTs and unstimulated Tg panel now and in 6 months. If she becomes pregnant earlier, we will start the bimonthly measurement of TFTs.  2. Therapeutic: Continue current doses of Synthroid, but adjust as needed. 3. Patient education: We discussed the need to progressively increase Synthroid doses during pregnancy to compensate for placental deiodinase activity.  Call me immediately when she becomes pregnant.  4. Follow-up: Follow-up appointment in six months.  Level of Service: This visit lasted in excess of 30 minutes. More than 50% of the visit was devoted to counseling.  Sherrlyn Hock

## 2014-03-10 NOTE — Patient Instructions (Signed)
Follow up visit in 6 months. Please repeat lab tests one week prior to the next appointment.

## 2014-04-04 ENCOUNTER — Telehealth: Payer: Self-pay | Admitting: "Endocrinology

## 2014-04-04 NOTE — Telephone Encounter (Addendum)
1. Patient called last week with a question about lab tests.  2. Before we could complete the call we lost the connection between the server and my laptop. I had to re-boot and initiate another telephone call. Betty Reyes

## 2014-04-04 NOTE — Telephone Encounter (Signed)
1. The patient called earlier this week to find out the results of her labs drawn on 03/10/14. 2. I returned her call and tried to find the results. The EPIC system shows that we wrote lab orders that day, but no results are available. She says that she did have the labs drawn that day.  I told her that we will contact [facility] on Monday to try to find the results.  Sherrlyn Hock

## 2014-05-05 ENCOUNTER — Other Ambulatory Visit: Payer: Self-pay | Admitting: *Deleted

## 2014-05-05 DIAGNOSIS — C73 Malignant neoplasm of thyroid gland: Secondary | ICD-10-CM

## 2014-05-29 ENCOUNTER — Other Ambulatory Visit (HOSPITAL_BASED_OUTPATIENT_CLINIC_OR_DEPARTMENT_OTHER): Payer: Self-pay | Admitting: Otolaryngology

## 2014-05-29 DIAGNOSIS — H8103 Meniere's disease, bilateral: Secondary | ICD-10-CM

## 2014-05-29 DIAGNOSIS — H9042 Sensorineural hearing loss, unilateral, left ear, with unrestricted hearing on the contralateral side: Secondary | ICD-10-CM

## 2014-05-30 ENCOUNTER — Ambulatory Visit (HOSPITAL_BASED_OUTPATIENT_CLINIC_OR_DEPARTMENT_OTHER)
Admission: RE | Admit: 2014-05-30 | Discharge: 2014-05-30 | Disposition: A | Discharge status: Home / Self Care | Payer: 59 | Source: Ambulatory Visit | Attending: Otolaryngology | Admitting: Otolaryngology

## 2014-05-30 ENCOUNTER — Other Ambulatory Visit: Payer: Self-pay | Admitting: "Endocrinology

## 2014-05-30 DIAGNOSIS — H9312 Tinnitus, left ear: Secondary | ICD-10-CM | POA: Insufficient documentation

## 2014-05-30 DIAGNOSIS — H8103 Meniere's disease, bilateral: Secondary | ICD-10-CM

## 2014-05-30 DIAGNOSIS — R110 Nausea: Secondary | ICD-10-CM | POA: Diagnosis not present

## 2014-05-30 DIAGNOSIS — R51 Headache: Secondary | ICD-10-CM | POA: Insufficient documentation

## 2014-05-30 DIAGNOSIS — R42 Dizziness and giddiness: Secondary | ICD-10-CM | POA: Insufficient documentation

## 2014-05-30 DIAGNOSIS — H9042 Sensorineural hearing loss, unilateral, left ear, with unrestricted hearing on the contralateral side: Secondary | ICD-10-CM

## 2014-05-30 DIAGNOSIS — H9192 Unspecified hearing loss, left ear: Secondary | ICD-10-CM | POA: Diagnosis not present

## 2014-05-30 MED ORDER — GADOBENATE DIMEGLUMINE 529 MG/ML IV SOLN: 18.0000 mL | Freq: Once | INTRAVENOUS | Status: AC | PRN

## 2014-05-31 LAB — TSH: TSH: 0.209 u[IU]/mL — AB (ref 0.350–4.500)

## 2014-05-31 LAB — T4, FREE: Free T4: 1.29 ng/dL (ref 0.80–1.80)

## 2014-05-31 LAB — T3, FREE: T3, Free: 3.5 pg/mL (ref 2.3–4.2)

## 2014-06-02 LAB — THYROGLOBULIN ANTIBODY PANEL
THYROID PEROXIDASE ANTIBODY: 37 [IU]/mL — AB (ref <9)
Thyroglobulin Ab: <1 IU/mL (ref <2)

## 2014-06-08 ENCOUNTER — Telehealth: Payer: Self-pay | Admitting: "Endocrinology

## 2014-06-09 ENCOUNTER — Encounter: Payer: Self-pay | Admitting: *Deleted

## 2014-06-09 NOTE — Telephone Encounter (Signed)
Spoke to patient this am and relayed the lab info to her and advised we had also sent a letter with the results. KW

## 2014-06-12 NOTE — L&D Delivery Note (Signed)
Delivery Note At 12:56 AM a viable female was delivered via Vaginal, Spontaneous Delivery (Presentation: Left Occiput Anterior).  APGAR: 8, 9; weight  .   Placenta status: Intact, Spontaneous.  Cord: 3 vessels with the following complications: tight nuchal, clamped and cut on perineum after delivery of head.  Cord pH: not indicated  Anesthesia: Epidural  Episiotomy: None Lacerations: 2nd degree;Perineal Suture Repair: 3.0 vicryl rapide Est. Blood Loss (mL): 250  Mom to postpartum.  Baby to Couplet care / Skin to Skin.  Kamiyah Kindel A. 02/16/2015, 1:57 AM

## 2014-06-22 ENCOUNTER — Encounter: Payer: Self-pay | Admitting: "Endocrinology

## 2014-06-22 ENCOUNTER — Ambulatory Visit (INDEPENDENT_AMBULATORY_CARE_PROVIDER_SITE_OTHER): Payer: 59 | Admitting: "Endocrinology

## 2014-06-22 VITALS — BP 116/72 | HR 66 | Wt 195.0 lb

## 2014-06-22 DIAGNOSIS — E669 Obesity, unspecified: Secondary | ICD-10-CM

## 2014-06-22 DIAGNOSIS — E890 Postprocedural hypothyroidism: Secondary | ICD-10-CM

## 2014-06-22 DIAGNOSIS — E0580 Other thyrotoxicosis without thyrotoxic crisis or storm: Secondary | ICD-10-CM

## 2014-06-22 DIAGNOSIS — L680 Hirsutism: Secondary | ICD-10-CM

## 2014-06-22 DIAGNOSIS — Z3401 Encounter for supervision of normal first pregnancy, first trimester: Secondary | ICD-10-CM

## 2014-06-22 DIAGNOSIS — C73 Malignant neoplasm of thyroid gland: Secondary | ICD-10-CM

## 2014-06-22 DIAGNOSIS — Z3480 Encounter for supervision of other normal pregnancy, unspecified trimester: Secondary | ICD-10-CM | POA: Insufficient documentation

## 2014-06-22 NOTE — Progress Notes (Signed)
CC: FU papillary thyroid cancer, post-operative hypothyroidism, iatrogenic hyperthyroidism, fatigue, bradycardia, overweight/obesity,and infertility  A. HPI: [name] is a 39 y.o. Caucasian woman who is accompanied today by her husband, Betty Reyes.  1. [name] was diagnosed some time prior to 2010 with hypothyroidism, presumably secondary to Hashimoto's Disease. She was treated with Synthroid, 50 mcg per day. In the Summer of 2010 a goiter was noted.  A thyroid US showed multiple nodules bilaterally, with the largest being 2.6 cm in diameter. [name] was referred to Dr. Stark Betty Reyes, a local general surgeon, for further evaluation and management. Dr. Barry Betty Reyes performed a fine needle aspiration, which reportedly showed Hashimoto's Disease. A follow-up thyroid US in the Summer of 2011 showed that the previously aspirated nodule and two other nodules had enlarged during the year. Dr. Barry Betty Reyes performed a total thyroidectomy on 05/19/10. 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 The largest microfocus was in the superior right lobe, measured 4 mm in diameter, invaded the capsule, but did not extend extrathyroidally.     2. I saw the patient and her husband in consultation on 05/27/10. Clinically she showed no evidence of active thyroid cancer. I then reviewed her surgical notes and pathology reports and the latest clinical guidelines on the management of thyroid cancer from the [employer] with the patient and her husband. We discussed the options for therapy, including whether or not to give radioactive iodine therapy (RAI). Although microfocal cancers usually have a low risk of recurrence or death, her case had two factors that would potentially increase her risks. First, the fact that she had multiple foci in both lobes. Second, the fact that one microfocus did invade the thyroid capsule and might have spread  extrathyroidally, although the pathologist did not see evidence of extrathyroidal extension on the slides he reviewed.  I asked the patient to take oral contraceptives from that point until she either decided not to take RAI or until at least one year after receiving RAI. She agreed.  3.  On 06/14/10 I met with [name] and her husband again and we had another full and open discussion about the advantages and disadvantages of using RAI. Since the Hennises do not yet have children, and since neither of them wanted to leave a cancer untreated when a potentially curable treatment such as RAI was available, they wanted to proceed with RAI as soon as possible. Since [name] had not been on thyroid hormone replacement since surgery, she chose to receive RAI while undergoing thyroid hormone withdrawal and while following a strict low iodine diet. On 06/27/10, [name] received 50 millicuries of D-782. Her post-RAI scan showed uptake in the thyroid bed, but no spread outside the thyroid bed. As I explained to the Hennises, the I-131 uptake could have been due to the presence of some residual thyroid cells, or some thyroid cancer cells, or both.   4. The patient has done well clinically since her I-131 therapy. She has had no evidence of recurrence on physical exam. Her thyroglobulin (Tg) levels have progressively declined. On 06/01/10 the post-operative Tg level was 20.8. On 06/310 the pre-I-131 Tg level was 2.9. She was hypothyroid for both of these Tg values. On 09/08/10 the post-I-131 Tg level was <0.2. At that point [name] was taking Synthroid, 175 mcg/day. Her unstimulated thyroglobulin levels have remained < 0.2 ever since.  5. [name] last clinic visit  with me was on 03/10/14. In the interim she has been healthy.   A. Her allergies bother her at times. At times she has vertigo coming from her left ear. She has seen ENT where the hearing was somewhat less on her left side. She has been diagnoses  with Menieres' disease.   B. She is now pregnant after natural conception. She missed a period this month. A home pregnancy test and a beta HCG were positive for conception. She is probably about 5 weeks into gestation. She will see OB soon. Her husband has been followed in urology and is undergoing treatment with Clomid. She remains on Synthroid, 175 mcg/day, except for 1.5 of the 175 mcg pills every other Sunday. She is also taking a prenatal multivitamin. She no longer takes fish oil.   6. Pertinent Review of Systems: Constitutional. The patient feels "fine and happy". Her headaches are quite infrequent. She has no significant complaints that pertain to today's visit. Energy level is good overall. Sleep: The patient usually sleeps well. There are no significant complaints of insomnia, frequent awakening, unusual restlessness, or poor sleep quality.  Body temperature: The patient's body temperature seems to be normal overall. She is always colder than her husband. Weight: Weight has remained stable at 195 pounds.   Eyes: The patient's vision is good. She is not aware of any eye problems. Neck: The patient is not aware of any lumps or masses in her anterior neck and thyroid bed.  There have been no significant other problems with  pressure, discomfort, or difficulty swallowing. Heart: The patient feels the expected increase in heart rate during exercise or other physical activities. There have been no significant problems with palpitations, irregular heart beats, chest pain, or chest pressure. Gastrointestinal: Stomach and intestines seem to be working normally. Bowel movements are normal. There are no significant complaints of excessive hunger, acid reflux, upset stomach, stomach aches or pains, diarrhea, or constipation. Musculoskeletal: Muscles and extremities appear to be working normally. There are no significant problems with hand tremor, sweaty palms, palmar erythema, or lower leg  swelling. Psychological: Mood and psychological responses seem to be otherwise normal.  Mental: The patient's abilities to think, to pay attention, to remember, and to make decisions have normalized since being on Synthroid therapy. GYN: Her LMP was 05/11/14.     PAST MEDICAL, FAMILY, AND SOCIAL HISTORY: 1. Work and family: She works full-time as a Writer at [employer]. 2. Activities: She walks a lot with her husband, goes to the park a lot, and walks her dog regularly. 3. Primary care provider: Dr. Charletta Cousin in [place] 4. OB/GYN: Dr. Princess Bruins, [facility] 5. Surgeon: Dr. Stark Betty Reyes, [facility]  3. REVIEW OF SYSTEMS: [name] has no significant problems related to any of her other body systems  PHYSICAL EXAM: BP 116/72 mmHg  Pulse 66  Wt 195 lb (88.451 kg)  LMP 05/11/2014 She has not had any weight change since last visit  Constitutional: The patient looks healthy and appears physically and emotionally well. Thi is the happiest I have seen her in many months.   Eyes: There is no arcus or proptosis.  Mouth: The oropharynx appears normal. The tongue appears normal. There is normal oral moisture. There is no obvious gingivitis. She does not have her mild mustache today.  Neck: There are no bruits present. The thyroid gland is absent. There is some residual induration of the thyroid bed over the strap muscles. There is no  evidence of masses or nodes in the thyroid bed or supraclavicular areas. There is no tenderness to palpation. Lungs: The lungs are clear. Air movement is good. Heart: The heart rhythm and rate appear normal. Heart sounds S1 and S2 are normal. I do not appreciate any pathologic heart murmurs. Abdomen: The abdomen is enlarged, consistent with obesity. Bowel sounds are normal. The abdomen is soft and non-tender. There is no obviously palpable hepatomegaly, splenomegaly, or other masses.  Arms: Muscle mass appears appropriate  for age.  Hands: There is no obvious tremor. Phalangeal and metacarpophalangeal joints appear normal. Palms are normal. Legs: Muscle mass appears appropriate for age. There is no edema.  Neurologic: Muscle strength is normal for age and gender  in both the upper and the lower extremities. Muscle tone appears normal. Sensation to touch is normal in the legs.  Labs:   Labs 05/30/14: Thyroglobulin < 0.1, anti-thyroglobulin antibody <1, TPO antibody 37, TSH 0.209, free T4 1.29, free T3 3.5  Labs 11/19/13: TSH 0.217, free T4 1.73, free T3 3.4, thyroglobulin <0.2, thyroglobulin antibody < 20  Labs 09/18/13: TSH 0.137, free T4 1.70, free T3 3.4  Labs 05/30/13: TSH 1.422, free T4 1.51, free T3 3.1, Thyroglobulin <0.2, thyroglobulin antibody <20  Labs 02/19/13: TSH 1.734, free T4 1.35, free T3 3.2, TPO antibody 40.5, Tg antibody < 20, thyroglobulin < 0.2  Labs 10/01/12: TSH 0.312, free T4 1.66, free T3 3.0, TPO antibody 44.2, thyroglobulin < 0.2, anti-thyroglobulin antibody < 20.  Labs 10/23/11: TSH 0.214, free T4 1.54, free T3 3.2, thyroglobulin < 0.2, anti-thyroglobulin antibody < 20  Labs 08/21/11: TSH 17.234, free T4 1.73, free T3 3.3, Thyrogen-stimulated thyroglobulin (Tg) < 0.2 (undetectable). Anti-thyroglobulin antibody < 20 (normal).  Labs 04/20/11: Thyroglobulin (Tg) < 0.2 (undetectable), anti-thyroglobulin antibody (Tg Ab) < 20   Labs 04/04/11: TSH 0.154, free T4 1.57, free T3 3.1  ASSESSMENT: 1. Papillary thyroid cancer: There is still no clinical evidence of recurrence. As noted above, her Thyrogen-stimulated thyroglobulin (Tg) levels and unstimulated Tg levels have been unmeasurable since March 2012. She has no chemical evidence for recurrence. At this point it appears that [name]' PTC is in remission. She may well be cured, but she will need fairly intensive continuing follow-up of her PTC for at least the next two years. 2. Hypothyroid/hyperthyroid: Her TSH has been suppressed  iatrogenically since April 2015. Since she is now pregnant we will reduce the degree of TSH suppression mildly. During the pregnancy, her placenta will synthesize more deiodinase enzyme, so she will probably need more Synthroid in the 2nd and 3rd trimesters. We will need to re-check her TFTs every 2 months and increase her Synthroid doses as needed. Once the pregnancy ends successfully and the placenta is delivered, she should be able to resume her pre-pregnancy doses of Synthroid.  3. Fatigue: The fatigue that she felt while undergoing thyroid hormone withdrawal was profound. She is essentially back to normal. 4. Bradycardia: This problem has resolved.  5. Obesity: During the pregnancy her OB will follow her weight and the fetus' size to ensure that mom and fetus remain healthy. It's possible that she may develop gestational diabetes mellitus. Continuing daily physical activity will be important for mom and baby. 6. Hirsutism: Her increase in fat cell weight has caused some increase in insulin resistance and a compensatory increase in insulin production. The hyperinsulinemia, in turn, has caused more production of testosterone from her ovaries and androstenedione from her adrenal glands. Now, however, she will make more estrogen  that will overcome the female hormone she makes.   PLAN: 1. Diagnostic: I ordered repeat TFTs in two months. We will repeat TFTs every two months and see the patient every two months.   2. Therapeutic: Reduce the Synthroid to 175 mcg/day, skipping the extra half-doses on every other Sunday. Adjust the Synthroid doses as needed.  3. Patient education: We discussed the physiology of thyroid hormone balance during gestation.  We will need to progressively increase Synthroid doses during pregnancy to compensate for placental deiodinase activity.   4. Follow-up: Follow-up appointment in two months.  Level of Service: This visit lasted in excess of 50 minutes. More than 50% of the visit  was devoted to counseling.  Sherrlyn Hock

## 2014-06-22 NOTE — Patient Instructions (Signed)
Follow up visit in two months. Please have TFTs repeated one week prior to next appointment.

## 2014-06-29 ENCOUNTER — Other Ambulatory Visit: Payer: Self-pay | Admitting: *Deleted

## 2014-06-29 DIAGNOSIS — C73 Malignant neoplasm of thyroid gland: Secondary | ICD-10-CM

## 2014-07-02 ENCOUNTER — Telehealth: Payer: Self-pay | Admitting: "Endocrinology

## 2014-07-02 NOTE — Telephone Encounter (Signed)
Routed to provider. KW 

## 2014-07-06 ENCOUNTER — Other Ambulatory Visit: Payer: Self-pay | Admitting: "Endocrinology

## 2014-07-07 ENCOUNTER — Telehealth: Payer: Self-pay | Admitting: "Endocrinology

## 2014-07-07 DIAGNOSIS — E890 Postprocedural hypothyroidism: Secondary | ICD-10-CM

## 2014-07-07 LAB — OB RESULTS CONSOLE ANTIBODY SCREEN: Antibody Screen: NEGATIVE

## 2014-07-07 LAB — OB RESULTS CONSOLE RUBELLA ANTIBODY, IGM: Rubella: IMMUNE

## 2014-07-07 LAB — OB RESULTS CONSOLE RPR: RPR: NONREACTIVE

## 2014-07-07 LAB — OB RESULTS CONSOLE HEPATITIS B SURFACE ANTIGEN: Hepatitis B Surface Ag: NEGATIVE

## 2014-07-07 LAB — OB RESULTS CONSOLE ABO/RH: RH Type: NEGATIVE

## 2014-07-07 LAB — OB RESULTS CONSOLE HIV ANTIBODY (ROUTINE TESTING): HIV: NONREACTIVE

## 2014-07-07 NOTE — Telephone Encounter (Signed)
Dr Tobe Sos called and spoke to pt.  Nothing further needed. Betty Reyes

## 2014-07-07 NOTE — Telephone Encounter (Signed)
1. She called earlier. She is more fatigued and has lower energy. She had an appointment with her OB NP today. The NP told Betty Reyes that it is very normal for women at her stage in the first trimester to feel tired and fatigued. Betty Reyes feels better now.  2. Since we had just reduced her Synthroid dose to 175 mcg/day on 06/22/14, it is very unlikely that she could be hypothyroid now. We head planned to re-check her TFTs after two months. However, in order to put Betty Reyes's mind at ease, we will repeat the TFTs on/about 07/23/14.  3. I ordered a set of TFTs to be done. Betty Reyes

## 2014-07-13 LAB — OB RESULTS CONSOLE GC/CHLAMYDIA
Chlamydia: NEGATIVE
Gonorrhea: NEGATIVE

## 2014-08-08 LAB — T4, FREE: Free T4: 1.08 ng/dL (ref 0.80–1.80)

## 2014-08-08 LAB — TSH: TSH: 2.325 u[IU]/mL (ref 0.350–4.500)

## 2014-08-08 LAB — T3, FREE: T3, Free: 2.9 pg/mL (ref 2.3–4.2)

## 2014-08-17 ENCOUNTER — Telehealth: Payer: Self-pay | Admitting: *Deleted

## 2014-08-17 NOTE — Telephone Encounter (Signed)
LVM, advised that per Dr. Tobe Sos: TFTs are normal, but on the lower end of the normal range. Please resume taking 1.5 of the 175 mcg Synthroid tablets every other Sunday. Please continue the 175 mcg dose of Synthroid daily on the other days of the month. Call if any questions.

## 2014-08-27 ENCOUNTER — Ambulatory Visit (INDEPENDENT_AMBULATORY_CARE_PROVIDER_SITE_OTHER): Payer: 59 | Admitting: "Endocrinology

## 2014-08-27 ENCOUNTER — Encounter: Payer: Self-pay | Admitting: "Endocrinology

## 2014-08-27 VITALS — BP 123/82 | HR 69 | Wt 195.0 lb

## 2014-08-27 DIAGNOSIS — C73 Malignant neoplasm of thyroid gland: Secondary | ICD-10-CM | POA: Diagnosis not present

## 2014-08-27 DIAGNOSIS — R5382 Chronic fatigue, unspecified: Secondary | ICD-10-CM | POA: Diagnosis not present

## 2014-08-27 DIAGNOSIS — E669 Obesity, unspecified: Secondary | ICD-10-CM | POA: Diagnosis not present

## 2014-08-27 DIAGNOSIS — O2692 Pregnancy related conditions, unspecified, second trimester: Secondary | ICD-10-CM

## 2014-08-27 DIAGNOSIS — L680 Hirsutism: Secondary | ICD-10-CM

## 2014-08-27 DIAGNOSIS — E890 Postprocedural hypothyroidism: Secondary | ICD-10-CM

## 2014-08-27 NOTE — Progress Notes (Signed)
CC: FU papillary thyroid cancer, post-operative hypothyroidism, iatrogenic hyperthyroidism, fatigue, bradycardia, overweight/obesity,and infertility  A. HPI: Betty Reyes is a 39 y.o. Caucasian woman who is accompanied today by her husband, Betty Reyes.  1. Betty Reyes was diagnosed some time prior to 2010 with hypothyroidism, presumably secondary to Hashimoto's Disease. She was treated with Synthroid, 50 mcg per day. In the Summer of 2010 a goiter was noted.  A thyroid US showed multiple nodules bilaterally, with the largest being 2.6 cm in diameter. Betty Reyes was referred to Dr. Stark Klein, a local general surgeon, for further evaluation and management. Dr. Barry Dienes performed a fine needle aspiration, which reportedly showed Hashimoto's Disease. A follow-up thyroid US in the Summer of 2011 showed that the previously aspirated nodule and two other nodules had enlarged during the year. Dr. Barry Dienes performed a total thyroidectomy on 05/19/10. 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 The largest microfocus was in the superior right lobe, measured 4 mm in diameter, invaded the capsule, but did not extend extrathyroidally.     2. I saw the patient and her husband in consultation on 05/27/10. Clinically she showed no evidence of active thyroid cancer. I then reviewed her surgical notes and pathology reports and the latest clinical guidelines on the management of thyroid cancer from the [employer] with the patient and her husband. We discussed the options for therapy, including whether or not to give radioactive iodine therapy (RAI). Although microfocal cancers usually have a low risk of recurrence or death, her case had two factors that would potentially increase her risks. First, the fact that she had multiple foci in both lobes. Second, the fact that one microfocus did invade the thyroid capsule and might have spread  extrathyroidally, although the pathologist did not see evidence of extrathyroidal extension on the slides he reviewed.  I asked the patient to take oral contraceptives from that point until she either decided not to take RAI or until at least one year after receiving RAI. She agreed.  3.  On 06/14/10 I met with Betty Reyes and her husband again and we had another full and open discussion about the advantages and disadvantages of using RAI. Since the Hennises do not yet have children, and since neither of them wanted to leave a cancer untreated when a potentially curable treatment such as RAI was available, they wanted to proceed with RAI as soon as possible. Since Betty Reyes had not been on thyroid hormone replacement since surgery, she chose to receive RAI while undergoing thyroid hormone withdrawal and while following a strict low iodine diet. On 06/27/10, Betty Reyes received 50 millicuries of D-782. Her post-RAI scan showed uptake in the thyroid bed, but no spread outside the thyroid bed. As I explained to the Hennises, the I-131 uptake could have been due to the presence of some residual thyroid cells, or some thyroid cancer cells, or both.   4. The patient has done well clinically since her I-131 therapy. She has had no evidence of recurrence on physical exam. Her thyroglobulin (Tg) levels have progressively declined. On 06/01/10 the post-operative Tg level was 20.8. On 06/310 the pre-I-131 Tg level was 2.9. She was hypothyroid for both of these Tg values. On 09/08/10 the post-I-131 Tg level was <0.2. At that point Betty Reyes was taking Synthroid, 175 mcg/day. Her unstimulated thyroglobulin levels have remained < 0.2 ever since.  5. Betty Reyes' last clinic visit  with me was on 06/22/14. In the interim she has been healthy. She did have some stomach upset a week ago, but that has resolved. Her fatigue is also improving.   A. Her allergies bother her at times. Fortunately, however, she has not had any vertigo  for several months. She has been diagnoses with Menieres' disease.   B. She is now pregnant after natural conception. Her LMP was November 30th. Her EDC is 02/15/15.  Her husband has been followed in urology and is undergoing treatment with Clomid. She remains on Synthroid, 175 mcg/day, except for 1.5 of the 175 mcg pills every other Sunday. She takes her synthroid in the mornings and her prenatal multivitamins in the evenings. She no longer takes fish oil.   6. Pertinent Review of Systems: Constitutional. The patient feels "good". Her headaches have not bothered her recently. She has no significant complaints that pertain to today's visit. Energy level is good overall. Sleep: The patient usually sleeps well. There are no significant complaints of insomnia, frequent awakening, unusual restlessness, or poor sleep quality.  Body temperature: The patient's body temperature seems to be normal overall. She is always colder than her husband. Weight: Weight has remained stable at 195 pounds.   Eyes: The patient's vision is good. She is not aware of any eye problems. Neck: The patient is not aware of any lumps or masses in her anterior neck and thyroid bed.  There have been no significant other problems with  pressure, discomfort, or difficulty swallowing. Heart: The patient feels the expected increase in heart rate during exercise or other physical activities. There have been no significant problems with palpitations, irregular heart beats, chest pain, or chest pressure. Gastrointestinal: Stomach and intestines seem to be working normally now. Bowel movements are normal. There are no significant complaints of excessive hunger, acid reflux, upset stomach, stomach aches or pains, diarrhea, or constipation. Musculoskeletal: Muscles and extremities appear to be working normally. There are no significant problems with hand tremor, sweaty palms, palmar erythema, or lower leg swelling. Psychological: Mood and  psychological responses seem to be otherwise normal.  Mental: The patient's abilities to think, to pay attention, to remember, and to make decisions have normalized since being on Synthroid therapy. GYN: Her LMP was 05/11/14.     PAST MEDICAL, FAMILY, AND SOCIAL HISTORY: 1. Work and family: She works full-time as a Writer at [employer]. 2. Activities: She walks a lot with her husband, goes to the park a lot, and walks her dog regularly. 3. Primary care provider: Dr. Charletta Cousin in [place] 4. OB/GYN: Dr. Princess Bruins, [facility] 5. Surgeon: Dr. Stark Klein, [facility]  3. REVIEW OF SYSTEMS: Betty Reyes has no significant problems related to any of her other body systems  PHYSICAL EXAM: BP 123/82 mmHg  Pulse 69  Wt 195 lb (88.451 kg) She has not had any weight change since last visit  Constitutional: The patient looks healthy and appears physically and emotionally well. She looks abit tired, but otherwise guite good.    Eyes: There is no arcus or proptosis.  Mouth: The oropharynx appears normal. The tongue appears normal. There is normal oral moisture. There is no obvious gingivitis. She does not have her mild mustache today.  Neck: There are no bruits present. The thyroid gland is absent. There is some residual induration of the thyroid bed over the strap muscles. There is no evidence of masses or nodes in the thyroid bed or supraclavicular areas. There  is no tenderness to palpation. Lungs: The lungs are clear. Air movement is good. Heart: The heart rhythm and rate appear normal. Heart sounds S1 and S2 are normal. I do not appreciate any pathologic heart murmurs. Abdomen: The abdomen is enlarged, consistent with obesity. Bowel sounds are normal. The abdomen is soft and non-tender. There is no obviously palpable hepatomegaly, splenomegaly, or other masses.  Arms: Muscle mass appears appropriate for age.  Hands: There is no obvious tremor. Phalangeal  and metacarpophalangeal joints appear normal. Palms are normal. Legs: Muscle mass appears appropriate for age. There is no edema.  Neurologic: Muscle strength is normal for age and gender  in both the upper and the lower extremities. Muscle tone appears normal. Sensation to touch is normal in the legs.  Labs:   Labs 08/08/13: TSH 2.325, free T4 1.08, free T3 2.9  Labs 05/30/14: Thyroglobulin < 0.1, anti-thyroglobulin antibody <1, TPO antibody 37, TSH 0.209, free T4 1.29, free T3 3.5  Labs 11/19/13: TSH 0.217, free T4 1.73, free T3 3.4, thyroglobulin <0.2, thyroglobulin antibody < 20  Labs 09/18/13: TSH 0.137, free T4 1.70, free T3 3.4  Labs 05/30/13: TSH 1.422, free T4 1.51, free T3 3.1, Thyroglobulin <0.2, thyroglobulin antibody <20  Labs 02/19/13: TSH 1.734, free T4 1.35, free T3 3.2, TPO antibody 40.5, Tg antibody < 20, thyroglobulin < 0.2  Labs 10/01/12: TSH 0.312, free T4 1.66, free T3 3.0, TPO antibody 44.2, thyroglobulin < 0.2, anti-thyroglobulin antibody < 20.  Labs 10/23/11: TSH 0.214, free T4 1.54, free T3 3.2, thyroglobulin < 0.2, anti-thyroglobulin antibody < 20  Labs 08/21/11: TSH 17.234, free T4 1.73, free T3 3.3, Thyrogen-stimulated thyroglobulin (Tg) < 0.2 (undetectable). Anti-thyroglobulin antibody < 20 (normal).  Labs 04/20/11: Thyroglobulin (Tg) < 0.2 (undetectable), anti-thyroglobulin antibody (Tg Ab) < 20   Labs 04/04/11: TSH 0.154, free T4 1.57, free T3 3.1  ASSESSMENT: 1. Papillary thyroid cancer: There is still no clinical evidence of recurrence. As noted above, her Thyrogen-stimulated thyroglobulin (Tg) levels and unstimulated Tg levels have been unmeasurable since March 2012. She has no chemical evidence for recurrence. At this point it appears that Betty Reyes' PTC is in remission. She may well be cured, but she will need fairly intensive continuing follow-up of her PTC for at least the next two years. 2. Hypothyroid/hyperthyroid: Her TSH has been suppressed  iatrogenically since April 2015. Since she is now pregnant we will reduce the degree of TSH suppression mildly. During the pregnancy, her placenta will synthesize more deiodinase enzyme, so she will probably need more Synthroid in the 2nd and 3rd trimesters. We will need to re-check her TFTs every 2 months and increase her Synthroid doses as needed. Once the pregnancy ends successfully and the placenta is delivered, she should be able to resume her pre-pregnancy doses of Synthroid.  3. Fatigue: The fatigue that she felt while undergoing thyroid hormone withdrawal was profound. She is essentially back to normal. 4. Bradycardia: This problem has resolved.  5. Obesity: During the pregnancy her OB will follow her weight and the fetus' size to ensure that mom and fetus remain healthy. It's possible that she may develop gestational diabetes mellitus. Continuing daily physical activity will be important for mom and baby. 6. Hirsutism: Her increase in fat cell weight has caused some increase in insulin resistance and a compensatory increase in insulin production. The hyperinsulinemia, in turn, has caused more production of testosterone from her ovaries and androstenedione from her adrenal glands. Now, however, she will make more estrogen that will  overcome the female hormone she makes. 7. Pregnancy complicated, second trimester:  We will need to repeat her TFTs every two months and adjust Synthroid doses accordingly.   PLAN: 1. Diagnostic: I ordered repeat TFTs in two months. We will repeat TFTs every two months and see the patient every two months.   2. Therapeutic: Increase the Synthroid to 175 mcg daily, except 1.5 of the 175 mcg pills on alternating Sundays.  Adjust the Synthroid doses as needed.  3. Patient education: We discussed the physiology of thyroid hormone balance during gestation.  We will need to progressively increase Synthroid doses during pregnancy to compensate for placental deiodinase activity.    4. Follow-up: Follow-up appointment in two months.  Level of Service: This visit lasted in excess of 50 minutes. More than 50% of the visit was devoted to counseling.  Sherrlyn Hock

## 2014-08-27 NOTE — Patient Instructions (Signed)
Follow up visit in 2 months. Please repeat lab tests one week prior to next appointment.

## 2014-11-06 LAB — TSH: TSH: 0.207 u[IU]/mL — ABNORMAL LOW (ref 0.350–4.500)

## 2014-11-06 LAB — T3, FREE: T3 FREE: 2.7 pg/mL (ref 2.3–4.2)

## 2014-11-06 LAB — T4, FREE: FREE T4: 1.17 ng/dL (ref 0.80–1.80)

## 2014-11-17 ENCOUNTER — Ambulatory Visit (INDEPENDENT_AMBULATORY_CARE_PROVIDER_SITE_OTHER): Payer: 59 | Admitting: "Endocrinology

## 2014-11-17 ENCOUNTER — Encounter: Payer: Self-pay | Admitting: "Endocrinology

## 2014-11-17 VITALS — BP 130/68 | HR 74 | Wt 203.0 lb

## 2014-11-17 DIAGNOSIS — E669 Obesity, unspecified: Secondary | ICD-10-CM | POA: Diagnosis not present

## 2014-11-17 DIAGNOSIS — L680 Hirsutism: Secondary | ICD-10-CM

## 2014-11-17 DIAGNOSIS — R5383 Other fatigue: Secondary | ICD-10-CM

## 2014-11-17 DIAGNOSIS — E0580 Other thyrotoxicosis without thyrotoxic crisis or storm: Secondary | ICD-10-CM | POA: Diagnosis not present

## 2014-11-17 DIAGNOSIS — C73 Malignant neoplasm of thyroid gland: Secondary | ICD-10-CM | POA: Diagnosis not present

## 2014-11-17 DIAGNOSIS — O09293 Supervision of pregnancy with other poor reproductive or obstetric history, third trimester: Secondary | ICD-10-CM

## 2014-11-17 NOTE — Progress Notes (Signed)
CC: FU papillary thyroid cancer, post-operative hypothyroidism, iatrogenic hyperthyroidism, fatigue, bradycardia, overweight/obesity, previous infertility, and current pregnancy  A. HPI: Ms. Mckimmy is a 39 y.o. Caucasian woman who is unaccompanied today.  1. Ms. Godino was diagnosed some time prior to 2010 with hypothyroidism, presumably secondary to Hashimoto's Disease. She was treated with Synthroid, 50 mcg per day. In the Summer of 2010 a goiter was noted.  A thyroid US showed multiple nodules bilaterally, with the largest being 2.6 cm in diameter. Ms. Varin was referred to Dr. Stark Klein, a local general surgeon, for further evaluation and management. Dr. Barry Dienes performed a fine needle aspiration, which reportedly showed Hashimoto's Disease. A follow-up thyroid US in the Summer of 2011 showed that the previously aspirated nodule and two other nodules had enlarged during the year. Dr. Barry Dienes performed a total thyroidectomy on 05/19/10. 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 The largest microfocus was in the superior right lobe, measured 4 mm in diameter, invaded the capsule, but did not extend extrathyroidally.     2. I saw the patient and her husband in consultation on 05/27/10. Clinically she showed no evidence of active thyroid cancer. I then reviewed her surgical notes and pathology reports and the latest clinical guidelines on the management of thyroid cancer from the [employer] with the patient and her husband. We discussed the options for therapy, including whether or not to give radioactive iodine therapy (RAI). Although microfocal cancers usually have a low risk of recurrence or death, her case had two factors that would potentially increase her risks. First, the fact that she had multiple foci in both lobes. Second, the fact that one microfocus did invade the thyroid capsule and might have  spread extrathyroidally, although the pathologist did not see evidence of extrathyroidal extension on the slides he reviewed.  I asked the patient to take oral contraceptives from that point until she either decided not to take RAI or until at least one year after receiving RAI. She agreed.  3.  On 06/14/10 I met with Ms. Breaker and her husband again and we had another full and open discussion about the advantages and disadvantages of using RAI. Since the Hennises do not yet have children, and since neither of them wanted to leave a cancer untreated when a potentially curable treatment such as RAI was available, they wanted to proceed with RAI as soon as possible. Since Ms. Hargreaves had not been on thyroid hormone replacement since surgery, she chose to receive RAI while undergoing thyroid hormone withdrawal and while following a strict low iodine diet. On 06/27/10, Ms. Betton received 50 millicuries of Z-610. Her post-RAI scan showed uptake in the thyroid bed, but no spread outside the thyroid bed. As I explained to the Hennises, the I-131 uptake could have been due to the presence of some residual thyroid cells, or some thyroid cancer cells, or both.   4. The patient has done well clinically since her I-131 therapy. She has had no evidence of recurrence on serial physical exams. Her thyroglobulin (Tg) levels have progressively declined. On 06/01/10 the post-operative Tg level was 20.8. On 06/310 the pre-I-131 Tg level was 2.9. She was hypothyroid for both of these Tg values. On 09/08/10 the post-I-131 Tg level was <0.2. At that point Ms. Abed was taking Synthroid, 175 mcg/day. Her unstimulated thyroglobulin levels have remained < 0.2 ever since.  5. Ms Majette' last clinic  visit with me was on 08/27/14. In the interim she has been healthy. She is not having much problem with fatigue at this point.    A. Her pregnancy is progressing without any problems. Her LMP was November 30th. Her EDC is 02/15/15.  Her  husband has been followed in urology and is undergoing treatment with Clomid.   B. She remains on Synthroid, 175 mcg/day, except for 1.5 of the 175 mcg pills every other Sunday. She takes her Synthroid in the mornings and her prenatal multivitamins in the evenings. She no longer takes fish oil.   C. Her allergies have been quiescent. She has not had any vertigo. 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   6. Pertinent Review of Systems: Constitutional. The patient feels "good". Her headaches have not bothered her recently. She has no significant complaints that pertain to today's visit. Energy level is good overall. Sleep: The patient usually sleeps well. There are no significant complaints of insomnia, frequent awakening, unusual restlessness, or poor sleep quality. However, she has nocturia 2-3 times each night.   Body temperature: The patient's body temperature seems to be normal overall. She is about the same body temperature as her husband now. Weight: Weight has increased from 195 pounds to 203 pounds.   Eyes: The patient's vision is good. She is not aware of any eye problems. Neck: The patient is not aware of any lumps or masses in her anterior neck and thyroid bed.  There have been no significant other problems with  pressure, discomfort, or difficulty swallowing. Heart: The patient feels the expected increase in heart rate during exercise or other physical activities. There have been no significant problems with palpitations, irregular heart beats, chest pain, or chest pressure. Gastrointestinal: Stomach and intestines seem to be working normally now. Bowel movements are normal. There are no significant complaints of excessive hunger, acid reflux, upset stomach, stomach aches or pains, diarrhea, or constipation. Musculoskeletal: Muscles and extremities appear to be working normally. There are no significant problems with hand tremor, sweaty palms, palmar erythema, or lower leg  swelling. Psychological: Mood and psychological responses seem to be otherwise normal.  Mental: The patient's abilities to think, to pay attention, to remember, and to make decisions have normalized since being on Synthroid therapy. GYN: Her LMP was 05/11/14.     PAST MEDICAL, FAMILY, AND SOCIAL HISTORY: 1. Work and family: She works full-time as a Writer at [employer]. 2. Activities: She walks a lot with her husband, goes to the park a lot, and walks her dog regularly. 3. Primary care provider: Dr. Charletta Cousin in [place] 4. OB/GYN: Dr. Princess Bruins, [facility] 5. Surgeon: Dr. Stark Klein, [facility]  3. REVIEW OF SYSTEMS: Ms. Nevils has no significant problems related to any of her other body systems  PHYSICAL EXAM: BP 130/68 mmHg  Pulse 74  Wt 203 lb (92.08 kg) She has gained 8 pounds since her last visit.   Constitutional: The patient looks healthy and appears physically and emotionally well. She looks quite good.  She is definitely pregnant.  Eyes: There is no arcus or proptosis.  Mouth: The oropharynx appears normal. The tongue appears normal. There is normal oral moisture. There is no obvious gingivitis. She does have her mild grade I mustache today.  Neck: There are no bruits present. The thyroid gland is absent. There is some residual induration of the thyroid bed over the strap muscles, more on the left side than  on the right. There is no evidence of masses or nodes in the thyroid bed or supraclavicular areas. There is no tenderness to palpation. Lungs: The lungs are clear. Air movement is good. Heart: The heart rhythm and rate appear normal. Heart sounds S1 and S2 are normal. I do not appreciate any pathologic heart murmurs. Abdomen: The abdomen is enlarged, consistent with pregnancy. Bowel sounds are normal. The abdomen is soft and non-tender. There is no obviously palpable hepatomegaly, splenomegaly, or other masses.  Arms: Muscle mass  appears appropriate for age.  Hands: There is no obvious tremor. Phalangeal and metacarpophalangeal joints appear normal. Palms are normal. Legs: Muscle mass appears appropriate for age. There is no edema.  Neurologic: Muscle strength is normal for age and gender  in both the upper and the lower extremities. Muscle tone appears normal. Sensation to touch is normal in the legs.  Labs:   Labs 11/05/14: TSH 0.207, free T4 1.17, free T3 2.7  Labs 08/08/13: TSH 2.325, free T4 1.08, free T3 2.9  Labs 05/30/14: Thyroglobulin < 0.1, anti-thyroglobulin antibody <1, TPO antibody 37, TSH 0.209, free T4 1.29, free T3 3.5  Labs 11/19/13: TSH 0.217, free T4 1.73, free T3 3.4, thyroglobulin <0.2, thyroglobulin antibody < 20  Labs 09/18/13: TSH 0.137, free T4 1.70, free T3 3.4  Labs 05/30/13: TSH 1.422, free T4 1.51, free T3 3.1, Thyroglobulin <0.2, thyroglobulin antibody <20  Labs 02/19/13: TSH 1.734, free T4 1.35, free T3 3.2, TPO antibody 40.5, Tg antibody < 20, thyroglobulin < 0.2  Labs 10/01/12: TSH 0.312, free T4 1.66, free T3 3.0, TPO antibody 44.2, thyroglobulin < 0.2, anti-thyroglobulin antibody < 20.  Labs 10/23/11: TSH 0.214, free T4 1.54, free T3 3.2, thyroglobulin < 0.2, anti-thyroglobulin antibody < 20  Labs 08/21/11: TSH 17.234, free T4 1.73, free T3 3.3, Thyrogen-stimulated thyroglobulin (Tg) < 0.2 (undetectable). Anti-thyroglobulin antibody < 20 (normal).  Labs 04/20/11: Thyroglobulin (Tg) < 0.2 (undetectable), anti-thyroglobulin antibody (Tg Ab) < 20   Labs 04/04/11: TSH 0.154, free T4 1.57, free T3 3.1  ASSESSMENT: 1. Papillary thyroid cancer:   A. Her exam is normal today. There is still no clinical evidence of recurrence.   B. As noted above, her Thyrogen-stimulated thyroglobulin (Tg) levels and unstimulated Tg levels have been unmeasurable since March 2012. She has no chemical evidence for recurrence.   C. At this point it appears that Ms. Spoelstra' PTC is in remission. She may well be  cured, but she will need continuing follow-up of her PTC for at least the next two years. 2. Hypothyroid/hyperthyroid:   A. Her TSH had been suppressed iatrogenically since 2012. With the onset of this pregnancy, I reduced her Synthroid dose to keep her in the euthyroid range. In March 2016 she was euthyroid.   B. Her recent set of TFTs is confusing. By TSH she is hyperthyroid, by free T4 she is euthyroid, and by free T3 her hormone levels are lower, not higher. Given these inconsistencies, I would like to reduce her Synthroid dose slightly to keep all three TFTs in the normal range.   C. During the pregnancy, we expect her placenta to synthesize more deiodinase enzyme, so she will probably need more Synthroid in the 3rd trimester, but everyone is an individual.  We will need to re-check her TFTs every 2 months and adjust her Synthroid doses as needed. Once the pregnancy ends successfully and the placenta is delivered, she should be able to resume her pre-pregnancy doses of Synthroid.  3. Fatigue: She does  not seem to be any more fatigued than the average woman who is six-months pregnant. 4. Bradycardia: This problem has resolved.  5. Obesity: During the pregnancy her OB will follow her weight and the fetus' size to ensure that mom and fetus remain healthy. It's possible that she may develop gestational diabetes mellitus. Continuing daily physical activity will be important for mom and baby. She will have her GDM screening next week.  6. Hirsutism: Her increase in fat cell weight has caused some increase in insulin resistance and a compensatory increase in insulin production. The hyperinsulinemia, in turn, has caused more production of testosterone from her ovaries and androstenedione from her adrenal glands. Now, however, she will make more estrogen that will overcome the female hormone she makes. 7. Pregnancy complicated, third trimester:  We will need to repeat her TFTs every two months and adjust  Synthroid doses accordingly.   PLAN: 1. Diagnostic: I ordered repeat TFTs in one month and in two months. We will repeat TFTs every 1-2 months and see the patient every two months.   2. Therapeutic: Take Synthroid 175 mcg daily. Stop the extra half-pills on every other Sunday.  Adjust the Synthroid doses as needed.  3. Patient education: We discussed the physiology of thyroid hormone balance during gestation.  We will probably need to progressively increase Synthroid doses during pregnancy to compensate for placental deiodinase activity.   4. Follow-up: Follow-up appointment in two months.  Level of Service: This visit lasted in excess of 50 minutes. More than 50% of the visit was devoted to counseling.  Sherrlyn Hock

## 2014-11-17 NOTE — Patient Instructions (Signed)
Follow up visit in 2 months. Please have lab tests done one week prior.

## 2014-11-24 LAB — OB RESULTS CONSOLE RPR: RPR: NONREACTIVE

## 2014-12-16 LAB — T4, FREE: FREE T4: 1 ng/dL (ref 0.80–1.80)

## 2014-12-16 LAB — TSH: TSH: 0.811 u[IU]/mL (ref 0.350–4.500)

## 2014-12-16 LAB — T3, FREE: T3, Free: 2.4 pg/mL (ref 2.3–4.2)

## 2014-12-21 ENCOUNTER — Telehealth: Payer: Self-pay | Admitting: "Endocrinology

## 2014-12-21 DIAGNOSIS — E890 Postprocedural hypothyroidism: Secondary | ICD-10-CM

## 2014-12-21 NOTE — Telephone Encounter (Signed)
1. I talked with the patient about her recent TFTs from 12/16/14. [name] Her TFTS are at about the 80% of the normal range. I asked her to continue to take her current dose of Synthroid.  2. She will deliver her baby on or about 01/15/15. She will repeat her TFTS in mid-August. I will see her in follow up on 02/02/15.  Betty Reyes

## 2015-01-22 LAB — OB RESULTS CONSOLE GBS: GBS: NEGATIVE

## 2015-01-23 LAB — TSH: TSH: 0.529 u[IU]/mL (ref 0.350–4.500)

## 2015-01-23 LAB — T3, FREE: T3, Free: 2.8 pg/mL (ref 2.3–4.2)

## 2015-01-23 LAB — T4, FREE: Free T4: 1.29 ng/dL (ref 0.80–1.80)

## 2015-02-02 ENCOUNTER — Ambulatory Visit (INDEPENDENT_AMBULATORY_CARE_PROVIDER_SITE_OTHER): Payer: 59 | Admitting: "Endocrinology

## 2015-02-02 ENCOUNTER — Encounter: Payer: Self-pay | Admitting: "Endocrinology

## 2015-02-02 VITALS — BP 120/69 | HR 73 | Wt 214.2 lb

## 2015-02-02 DIAGNOSIS — Z3403 Encounter for supervision of normal first pregnancy, third trimester: Secondary | ICD-10-CM

## 2015-02-02 DIAGNOSIS — C73 Malignant neoplasm of thyroid gland: Secondary | ICD-10-CM

## 2015-02-02 DIAGNOSIS — E0580 Other thyrotoxicosis without thyrotoxic crisis or storm: Secondary | ICD-10-CM | POA: Diagnosis not present

## 2015-02-02 DIAGNOSIS — E890 Postprocedural hypothyroidism: Secondary | ICD-10-CM | POA: Diagnosis not present

## 2015-02-02 DIAGNOSIS — E669 Obesity, unspecified: Secondary | ICD-10-CM

## 2015-02-02 NOTE — Patient Instructions (Addendum)
Follow up visit in 3 months. Please repeat thyroid blood tests 1-2 weeks prior to next visit.

## 2015-02-02 NOTE — Progress Notes (Signed)
CC: FU papillary thyroid cancer, post-operative hypothyroidism, iatrogenic hyperthyroidism, fatigue, bradycardia, overweight/obesity, previous infertility, and current pregnancy  A. HPI: Betty Reyes is a 39 y.o. Caucasian woman who is unaccompanied today.  1. Betty Reyes was diagnosed some time prior to 2010 with hypothyroidism, presumably secondary to Hashimoto's Disease. She was treated with Synthroid, 50 mcg per day. In the Summer of 2010 a goiter was noted.  A thyroid US showed multiple nodules bilaterally, with the largest being 2.6 cm in diameter. Betty Reyes was referred to Dr. Stark Klein, a local general surgeon, for further evaluation and management. Dr. Barry Dienes performed a fine needle aspiration, which reportedly showed Hashimoto's Disease. A follow-up thyroid US in the Summer of 2011 showed that the previously aspirated nodule and two other nodules had enlarged during the year. Dr. Barry Dienes performed a total thyroidectomy on 05/19/10. 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 The largest microfocus was in the superior right lobe, measured 4 mm in diameter, invaded the capsule, but did not extend extrathyroidally.     2. I saw the patient and her husband in consultation on 05/27/10. Clinically she showed no evidence of active thyroid cancer. I then reviewed her surgical notes and pathology reports and the latest clinical guidelines on the management of thyroid cancer from the [employer] with the patient and her husband. We discussed the options for therapy, including whether or not to give radioactive iodine therapy (RAI). Although microfocal cancers usually have a low risk of recurrence or death, her case had two factors that would potentially increase her risks. First, the fact that she had multiple foci in both lobes. Second, the fact that one microfocus did invade the thyroid capsule and might have  spread extrathyroidally, although the pathologist did not see evidence of extrathyroidal extension on the slides he reviewed.  I asked the patient to take oral contraceptives from that point until she either decided not to take RAI or until at least one year after receiving RAI. She agreed.  3.  On 06/14/10 I met with Betty Reyes and her husband again and we had another full and open discussion about the advantages and disadvantages of using RAI. Since the Hennises do not yet have children, and since neither of them wanted to leave a cancer untreated when a potentially curative treatment such as RAI was available, they wanted to proceed with RAI as soon as possible. Since Betty Reyes had not been on thyroid hormone replacement since surgery, she chose to receive RAI while undergoing thyroid hormone withdrawal and while following a strict low iodine diet. On 06/27/10, Betty Reyes received 50 millicuries of L-373. Her post-RAI scan showed uptake in the thyroid bed, but no spread outside the thyroid bed. As I explained to the Hennises, the I-131 uptake could have been due to the presence of some residual thyroid cells, or some thyroid cancer cells, or both.   4. The patient has done well clinically since her I-131 therapy. She has had no evidence of recurrence on serial physical exams. Her thyroglobulin (Tg) levels have progressively declined. On 06/01/10 the post-operative Tg level was 20.8. On 06/14/10 the pre-I-131 Tg level was 2.9. She was hypothyroid for both of these Tg values. On 09/08/10 the post-I-131 Tg level was <0.2. At that point Betty Reyes was taking Synthroid, 175 mcg/day. Her unstimulated thyroglobulin levels have remained < 0.2 ever since.  5. Betty Reyes' last clinic  visit with me was on 11/17/14. In the interim she has been healthy. She is not having much problem with fatigue at this point.    A. Her pregnancy is progressing without any problems. Her LMP was November 30th. Her EDC is 02/15/15.  Her  husband has been followed in urology and is undergoing treatment with Clomid.   B. She remains on Synthroid, 175 mcg/day. She takes her Synthroid in the mornings and her prenatal multivitamins in the evenings. She no longer takes fish oil.   C. Her allergies have been quiescent. She has not had any vertigo. 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   6. Pertinent Review of Systems: Constitutional. The patient feels "good". Her headaches have not bothered her recently. She has no significant complaints that pertain to today's visit. Energy level is good overall. Sleep: The patient usually sleeps well for being almost 9 months pregnant. There are no significant complaints of insomnia, frequent awakening, unusual restlessness, or poor sleep quality. However, she has nocturia 2-3 times each night.   Body temperature: The patient's body temperature seems to be normal overall. She is about the same body temperature as her husband now. Weight: Weight has increased from 195 pounds to 214 pounds.   Eyes: The patient's vision is good. She is not aware of any eye problems. Neck: The patient is not aware of any lumps or masses in her anterior neck and thyroid bed.  There have been no significant other problems with  pressure, discomfort, or difficulty swallowing. Heart: The patient feels the expected increase in heart rate during exercise or other physical activities. There have been no significant problems with palpitations, irregular heart beats, chest pain, or chest pressure. Gastrointestinal: She has a little heartburn. Stomach and intestines seem to be working normally now. Bowel movements are normal. There are no significant complaints of excessive hunger, upset stomach, stomach aches or pains, diarrhea, or constipation. Musculoskeletal: Muscles and extremities appear to be working normally. There are no significant problems with hand tremor, sweaty palms, palmar erythema, or lower leg  swelling. Psychological: Mood and psychological responses seem to be normal for a woman in her 9th month of pregnancy.  Mental: The patient's abilities to think, to pay attention, to remember, and to make decisions have normalized since being on Synthroid therapy. GYN: Her LMP was 05/11/14.     PAST MEDICAL, FAMILY, AND SOCIAL HISTORY: 1. Work and family: She works full-time as a Writer at [employer]. 2. Activities: She walks a lot with her husband, goes to the park a lot, and walks her dog regularly. 3. Primary care provider: Dr. Charletta Cousin in [place] 4. OB/GYN: Dr. Princess Bruins, [facility] 5. Surgeon: Dr. Stark Klein, [facility]  3. REVIEW OF SYSTEMS: Betty Reyes has no significant problems related to any of her other body systems  PHYSICAL EXAM: BP 120/69 mmHg  Pulse 73  Wt 214 lb 3.2 oz (97.16 kg) She has gained 11 pounds since her last visit.   Constitutional: The patient looks healthy and appears physically and emotionally well. She looks quite good.  She is definitely pregnant.  Eyes: There is no arcus or proptosis.  Mouth: The oropharynx appears normal. The tongue appears normal. There is normal oral moisture. There is no obvious gingivitis. She does have her mild grade I mustache today.  Neck: There are no bruits present. The thyroid gland is absent. There continues to be some residual induration of the thyroid bed  over the strap muscles, more on the left side than on the right. There is no evidence of masses or nodes in the thyroid bed or supraclavicular areas. There is no tenderness to palpation. Lungs: The lungs are clear. Air movement is good. Heart: The heart rhythm and rate appear normal. Heart sounds S1 and S2 are normal. I do not appreciate any pathologic heart murmurs. Abdomen: The abdomen is enlarged, consistent with her stage of pregnancy. Bowel sounds are normal. The abdomen is soft and non-tender. There is no obviously  palpable hepatomegaly, splenomegaly, or other masses.  Arms: Muscle mass appears appropriate for age.  Hands: There is no obvious tremor. Phalangeal and metacarpophalangeal joints appear normal. Palms are normal. Legs: Muscle mass appears appropriate for age. There is no edema.  Neurologic: Muscle strength is normal for age and gender  in both the upper and the lower extremities. Muscle tone appears normal. Sensation to touch is normal in the legs.  Labs:   Labs 01/22/15: TSH 0.529, free T4 1.29, free T3 2.8  Labs 12/16/14: TSH 0.811, free T4 1.00, free T3 2.4  Labs 11/05/14: TSH 0.207, free T4 1.17, free T3 2.7  Labs 08/08/13: TSH 2.325, free T4 1.08, free T3 2.9  Labs 05/30/14: Thyroglobulin < 0.1, anti-thyroglobulin antibody <1, TPO antibody 37, TSH 0.209, free T4 1.29, free T3 3.5  Labs 11/19/13: TSH 0.217, free T4 1.73, free T3 3.4, thyroglobulin <0.2, thyroglobulin antibody < 20  Labs 09/18/13: TSH 0.137, free T4 1.70, free T3 3.4  Labs 05/30/13: TSH 1.422, free T4 1.51, free T3 3.1, Thyroglobulin <0.2, thyroglobulin antibody <20  Labs 02/19/13: TSH 1.734, free T4 1.35, free T3 3.2, TPO antibody 40.5, Tg antibody < 20, thyroglobulin < 0.2  Labs 10/01/12: TSH 0.312, free T4 1.66, free T3 3.0, TPO antibody 44.2, thyroglobulin < 0.2, anti-thyroglobulin antibody < 20.  Labs 10/23/11: TSH 0.214, free T4 1.54, free T3 3.2, thyroglobulin < 0.2, anti-thyroglobulin antibody < 20  Labs 08/21/11: TSH 17.234, free T4 1.73, free T3 3.3, Thyrogen-stimulated thyroglobulin (Tg) < 0.2 (undetectable). Anti-thyroglobulin antibody < 20 (normal).  Labs 04/20/11: Thyroglobulin (Tg) < 0.2 (undetectable), anti-thyroglobulin antibody (Tg Ab) < 20   Labs 04/04/11: TSH 0.154, free T4 1.57, free T3 3.1  ASSESSMENT: 1. Papillary thyroid cancer:   A. Her exam is normal today. There is still no clinical evidence of recurrence.   B. As noted above, her Thyrogen-stimulated thyroglobulin (Tg) levels and  unstimulated Tg levels have been unmeasurable since March 2012. She has no chemical evidence for recurrence.   C. At this point it appears that Betty Reyes' PTC is in remission. She may well be cured, but she will need continuing follow-up of her PTC for at least the next two years. 2. Hypothyroid/hyperthyroid:   A. Her TSH had been suppressed iatrogenically since 2012.   B. Her current TFTs indicate that her thyroid hormones are at the upper end of the normal range. Once the pregnancy ends successfully and the placenta is delivered, she should be able to continue her current Synthroid dose, but might need to reduce the dose to 150 mcg/day. Time will tell.  3. Fatigue: She does not seem to be any more fatigued than the average woman who is nine-months pregnant. 4. Bradycardia: This problem has resolved.  5. Obesity: She has become more obese during the pregnancy. After delivery she will need to work harder to reduce her excess weight.  6. Hirsutism: Her increase in fat cell weight has caused some increase in  insulin resistance and a compensatory increase in insulin production. The hyperinsulinemia, in turn, has caused more production of testosterone from her ovaries and androstenedione from her adrenal glands. Reducing her fat volume should result in reducing her androgen levels.  7. Pregnancy complicated, third trimester:  She has only 1-2 weeks to go.     PLAN: 1. Diagnostic: I ordered repeat TFTs, Tg, and anti-Tg antibody two weeks prior to next visit.  2. Therapeutic: Take Synthroid 175 mcg daily. Adjust the Synthroid doses as needed.  3. Patient education: We discussed the physiology of thyroid hormone balance during gestation and in the post-partum period.   4. Follow-up: Follow-up appointment in three months.  Level of Service: This visit lasted in excess of 50 minutes. More than 50% of the visit was devoted to counseling.  Sherrlyn Hock

## 2015-02-15 ENCOUNTER — Encounter (HOSPITAL_COMMUNITY): Payer: Self-pay | Admitting: *Deleted

## 2015-02-15 ENCOUNTER — Inpatient Hospital Stay (HOSPITAL_COMMUNITY)
Admission: AD | Admit: 2015-02-15 | Discharge: 2015-02-18 | DRG: 775 | Disposition: A | Discharge status: Home / Self Care | Payer: 59 | Source: Ambulatory Visit | Attending: Obstetrics | Admitting: Obstetrics

## 2015-02-15 ENCOUNTER — Inpatient Hospital Stay (HOSPITAL_COMMUNITY): Payer: 59 | Admitting: Anesthesiology

## 2015-02-15 DIAGNOSIS — E890 Postprocedural hypothyroidism: Secondary | ICD-10-CM | POA: Diagnosis present

## 2015-02-15 DIAGNOSIS — O4290 Premature rupture of membranes, unspecified as to length of time between rupture and onset of labor, unspecified weeks of gestation: Secondary | ICD-10-CM | POA: Diagnosis present

## 2015-02-15 DIAGNOSIS — Z833 Family history of diabetes mellitus: Secondary | ICD-10-CM

## 2015-02-15 DIAGNOSIS — Z809 Family history of malignant neoplasm, unspecified: Secondary | ICD-10-CM

## 2015-02-15 DIAGNOSIS — E039 Hypothyroidism, unspecified: Secondary | ICD-10-CM | POA: Diagnosis present

## 2015-02-15 DIAGNOSIS — Z923 Personal history of irradiation: Secondary | ICD-10-CM

## 2015-02-15 DIAGNOSIS — C73 Malignant neoplasm of thyroid gland: Secondary | ICD-10-CM | POA: Diagnosis present

## 2015-02-15 DIAGNOSIS — R5383 Other fatigue: Secondary | ICD-10-CM | POA: Diagnosis present

## 2015-02-15 DIAGNOSIS — O4292 Full-term premature rupture of membranes, unspecified as to length of time between rupture and onset of labor: Secondary | ICD-10-CM | POA: Diagnosis present

## 2015-02-15 DIAGNOSIS — O480 Post-term pregnancy: Principal | ICD-10-CM | POA: Diagnosis present

## 2015-02-15 DIAGNOSIS — Z3A40 40 weeks gestation of pregnancy: Secondary | ICD-10-CM | POA: Diagnosis present

## 2015-02-15 DIAGNOSIS — O09519 Supervision of elderly primigravida, unspecified trimester: Secondary | ICD-10-CM

## 2015-02-15 DIAGNOSIS — E669 Obesity, unspecified: Secondary | ICD-10-CM | POA: Diagnosis present

## 2015-02-15 DIAGNOSIS — O99284 Endocrine, nutritional and metabolic diseases complicating childbirth: Secondary | ICD-10-CM | POA: Diagnosis present

## 2015-02-15 DIAGNOSIS — O26899 Other specified pregnancy related conditions, unspecified trimester: Secondary | ICD-10-CM | POA: Diagnosis present

## 2015-02-15 DIAGNOSIS — O9A12 Malignant neoplasm complicating childbirth: Secondary | ICD-10-CM | POA: Diagnosis present

## 2015-02-15 DIAGNOSIS — Z6832 Body mass index (BMI) 32.0-32.9, adult: Secondary | ICD-10-CM | POA: Diagnosis not present

## 2015-02-15 DIAGNOSIS — O99214 Obesity complicating childbirth: Secondary | ICD-10-CM | POA: Diagnosis present

## 2015-02-15 DIAGNOSIS — R001 Bradycardia, unspecified: Secondary | ICD-10-CM | POA: Diagnosis present

## 2015-02-15 DIAGNOSIS — O43013 Fetomaternal placental transfusion syndrome, third trimester: Secondary | ICD-10-CM | POA: Diagnosis present

## 2015-02-15 DIAGNOSIS — Z8249 Family history of ischemic heart disease and other diseases of the circulatory system: Secondary | ICD-10-CM

## 2015-02-15 DIAGNOSIS — Z6791 Unspecified blood type, Rh negative: Secondary | ICD-10-CM

## 2015-02-15 LAB — CBC
HCT: 38.4 % (ref 36.0–46.0)
HEMOGLOBIN: 13.1 g/dL (ref 12.0–15.0)
MCH: 29.7 pg (ref 26.0–34.0)
MCHC: 34.1 g/dL (ref 30.0–36.0)
MCV: 87.1 fL (ref 78.0–100.0)
Platelets: 185 10*3/uL (ref 150–400)
RBC: 4.41 MIL/uL (ref 3.87–5.11)
RDW: 15.2 % (ref 11.5–15.5)
WBC: 11.1 10*3/uL — ABNORMAL HIGH (ref 4.0–10.5)

## 2015-02-15 LAB — AMNISURE RUPTURE OF MEMBRANE (ROM) NOT AT ARMC: Amnisure ROM: POSITIVE

## 2015-02-15 MED ORDER — FENTANYL 2.5 MCG/ML BUPIVACAINE 1/10 % EPIDURAL INFUSION (WH - ANES)
14.0000 mL/h | INTRAMUSCULAR | Status: DC | PRN
Start: 2015-02-15 — End: 2015-02-16
  Administered 2015-02-15 (×2): 14 mL/h via EPIDURAL
  Filled 2015-02-15: qty 125

## 2015-02-15 MED ORDER — LIDOCAINE HCL (PF) 1 % IJ SOLN
30.0000 mL | INTRAMUSCULAR | Status: DC | PRN
  Filled 2015-02-15: qty 30

## 2015-02-15 MED ORDER — OXYTOCIN 40 UNITS IN LACTATED RINGERS INFUSION - SIMPLE MED: 62.5000 mL/h | INTRAVENOUS | Status: DC

## 2015-02-15 MED ORDER — CITRIC ACID-SODIUM CITRATE 334-500 MG/5ML PO SOLN: 30.0000 mL | ORAL | Status: DC | PRN

## 2015-02-15 MED ORDER — OXYCODONE-ACETAMINOPHEN 5-325 MG PO TABS: 1.0000 | ORAL_TABLET | ORAL | Status: DC | PRN

## 2015-02-15 MED ORDER — LIDOCAINE HCL (PF) 1 % IJ SOLN
INTRAMUSCULAR | Status: DC | PRN
  Administered 2015-02-15 (×2): 9 mL via EPIDURAL

## 2015-02-15 MED ORDER — TERBUTALINE SULFATE 1 MG/ML IJ SOLN
0.2500 mg | Freq: Once | INTRAMUSCULAR | Status: DC | PRN
  Filled 2015-02-15: qty 1

## 2015-02-15 MED ORDER — LACTATED RINGERS IV SOLN: 500.0000 mL | INTRAVENOUS | Status: DC | PRN

## 2015-02-15 MED ORDER — OXYTOCIN BOLUS FROM INFUSION: 500.0000 mL | INTRAVENOUS | Status: DC

## 2015-02-15 MED ORDER — PHENYLEPHRINE 40 MCG/ML (10ML) SYRINGE FOR IV PUSH (FOR BLOOD PRESSURE SUPPORT)
80.0000 ug | PREFILLED_SYRINGE | INTRAVENOUS | Status: DC | PRN
  Filled 2015-02-15: qty 20
  Filled 2015-02-15: qty 2

## 2015-02-15 MED ORDER — LACTATED RINGERS IV SOLN
INTRAVENOUS | Status: DC
  Administered 2015-02-15 (×2): via INTRAVENOUS

## 2015-02-15 MED ORDER — OXYTOCIN 40 UNITS IN LACTATED RINGERS INFUSION - SIMPLE MED
1.0000 m[IU]/min | INTRAVENOUS | Status: DC
  Administered 2015-02-15: 2 m[IU]/min via INTRAVENOUS
  Filled 2015-02-15: qty 1000

## 2015-02-15 MED ORDER — FLEET ENEMA 7-19 GM/118ML RE ENEM: 1.0000 | ENEMA | RECTAL | Status: DC | PRN

## 2015-02-15 MED ORDER — ONDANSETRON HCL 4 MG/2ML IJ SOLN: 4.0000 mg | Freq: Four times a day (QID) | INTRAMUSCULAR | Status: DC | PRN

## 2015-02-15 MED ORDER — SODIUM BICARBONATE 8.4 % IV SOLN
INTRAVENOUS | Status: DC | PRN
  Administered 2015-02-15 (×2): 5 mL via EPIDURAL

## 2015-02-15 MED ORDER — ACETAMINOPHEN 325 MG PO TABS: 650.0000 mg | ORAL_TABLET | ORAL | Status: DC | PRN

## 2015-02-15 MED ORDER — MISOPROSTOL 25 MCG QUARTER TABLET
25.0000 ug | ORAL_TABLET | ORAL | Status: AC
  Administered 2015-02-15 (×2): 25 ug via ORAL
  Filled 2015-02-15 (×2): qty 0.25

## 2015-02-15 MED ORDER — DIPHENHYDRAMINE HCL 50 MG/ML IJ SOLN: 12.5000 mg | INTRAMUSCULAR | Status: DC | PRN

## 2015-02-15 MED ORDER — EPHEDRINE 5 MG/ML INJ
10.0000 mg | INTRAVENOUS | Status: DC | PRN
  Filled 2015-02-15: qty 2

## 2015-02-15 MED ORDER — OXYCODONE-ACETAMINOPHEN 5-325 MG PO TABS: 2.0000 | ORAL_TABLET | ORAL | Status: DC | PRN

## 2015-02-15 NOTE — Progress Notes (Signed)
S: Doing well, no complaints, pain well controlled, feeling mild cramping after 1st cytotec. Still leaking small amounts blood tinged fluid. Good FM  O: BP 128/66 mmHg  Pulse 66  Temp(Src) 98.3 F (36.8 C) (Oral)  Resp 20  Ht 5\' 8"  (1.727 m)  Wt 97.523 kg (215 lb)  BMI 32.70 kg/m2  LMP 05/11/2014   FHT:  FHR: 140s bpm, variability: moderate,  accelerations:  Present,  decelerations:  Absent UC:   Mild irritibility q 5-8 min SVE:   Dilation: 1.5 Effacement (%): 30 Station: -3 Exam by:: Dr. Pamala Hurry   A / P:  39 y.o.  Obstetric History   G1   P0   T0   P0   A0   TAB0   SAB0   E0   M0   L0    at [redacted]w[redacted]d IOL due to PROM. cytotec, pitocin planned after cytotec x 2  Fetal Wellbeing:  Category I Pain Control:  Labor support without medications  Anticipated MOD:  NSVD  Rayen Dafoe A. 02/15/2015, 1:10 PM

## 2015-02-15 NOTE — Anesthesia Procedure Notes (Signed)
Epidural Patient location during procedure: OB Start time: 02/15/2015 9:02 PM End time: 02/15/2015 9:06 PM  Staffing Anesthesiologist: Lyn Hollingshead Performed by: anesthesiologist   Preanesthetic Checklist Completed: patient identified, surgical consent, pre-op evaluation, timeout performed, IV checked, risks and benefits discussed and monitors and equipment checked  Epidural Patient position: sitting Prep: site prepped and draped and DuraPrep Patient monitoring: continuous pulse ox and blood pressure Approach: midline Location: L3-L4 Injection technique: LOR air  Needle:  Needle type: Tuohy  Needle gauge: 17 G Needle length: 9 cm and 9 Needle insertion depth: 5 cm cm Catheter type: closed end flexible Catheter size: 19 Gauge Catheter at skin depth: 10 cm Test dose: negative and Other  Assessment Sensory level: T9 Events: blood not aspirated, injection not painful, no injection resistance, negative IV test and no paresthesia  Additional Notes Reason for block:procedure for pain

## 2015-02-15 NOTE — H&P (Signed)
Betty Reyes is a 39 y.o. G1P0 at [redacted]w[redacted]d presenting for ROM. Pt notes no contractions. Good fetal movement. Started leaking fluid after rising to void at 4 am. Notes slight blood tinge to fluid.[name]  [facility] [place] since 8 wks - Dated by LMP c/w 8 wk u/s - B neg, plan Rhogam PP - AMA, nl Informaseq, XY, isolated  CPC - low lying placenta, resolved - hypothyroid after thyroidectomy for thyroid CA, on synthroid, followed by endocrine - fetal growth- 77% at 28 wks, increased AFI at 20 or 38 wks - GBS neg - Obesity, 15# wt gain.    Prenatal Transfer Tool  Maternal Diabetes: No Genetic Screening: Normal Maternal Ultrasounds/Referrals: Normal Fetal Ultrasounds or other Referrals:  None Maternal Substance Abuse:  No Significant Maternal Medications:  None Significant Maternal Lab Results: None     OB History    Gravida Para Term Preterm AB TAB SAB Ectopic Multiple Living   1              Past Medical History  Diagnosis Date  . Thyroid disease     HYPOTHYROIDISM  . Cancer   . Thyroid cancer   . Hypothyroidism, postop   . Fatigue   . Bradycardia   . Obesity   . History of radioactive iodine thyroid ablation    Past Surgical History  Procedure Laterality Date  . Total thyroidectomy     Family History: family history includes Cancer in her brother and mother; Diabetes in her maternal grandfather; Heart disease in her father; Hyperlipidemia in her father; Hypertension in her father; Hypothyroidism in her paternal grandmother. Social History:  reports that she has never smoked. She has never used smokeless tobacco. She reports that she does not drink alcohol or use illicit drugs.  Review of Systems - Negative except leaking blood tinged fluid   Dilation: 1.5 Effacement (%): 30 Station: -3 Exam by:: Dr. Pamala Hurry Blood pressure 140/74, pulse 69, temperature 97.6 F (36.4 C), temperature source Oral, resp. rate 20, height 5\' 8"  (1.727 m), weight 97.523 kg (215 lb),  last menstrual period 05/11/2014.  Physical Exam:  Gen: well appearing, no distress Abd: gravid, NT, no RUQ pain LE: no edema, equal bilaterally, non-tender Toco: rare FH: baseline 140s, accelerations present, no deceleratons, 10 beat variability SSE: scant, blood tinged mucous, membranes palpate intact, fern neg, pool neg.   Prenatal labs: ABO, Rh:  B neg Antibody:  neg Rubella:  immune RPR:   neg HBsAg:   neg HIV:   neg GBS:   neg 1 hr Glucola 118  Genetic screening nl Infomaseq Anatomy US normal  Amniosure- positive for ROM   Assessment/Plan: 39 y.o. G1P0 at [redacted]w[redacted]d ROM. Recc move to delivery, as cervix not ripe, plan cytotec then pitocin Reactive fetal testing   Betty Reyes A. 02/15/2015, 9:13 AM

## 2015-02-15 NOTE — Anesthesia Preprocedure Evaluation (Signed)
Anesthesia Evaluation  Patient identified by MRN, date of birth, ID band Patient awake    Reviewed: Allergy & Precautions, H&P , NPO status , Patient's Chart, lab work & pertinent test results  Airway Mallampati: I  TM Distance: >3 FB Neck ROM: full    Dental no notable dental hx.    Pulmonary neg pulmonary ROS,    Pulmonary exam normal       Cardiovascular negative cardio ROS Normal cardiovascular exam    Neuro/Psych negative neurological ROS  negative psych ROS   GI/Hepatic negative GI ROS, Neg liver ROS,   Endo/Other    Renal/GU negative Renal ROS     Musculoskeletal   Abdominal (+) + obese,   Peds  Hematology negative hematology ROS (+)   Anesthesia Other Findings   Reproductive/Obstetrics (+) Pregnancy                             Anesthesia Physical Anesthesia Plan  ASA: II  Anesthesia Plan: Epidural   Post-op Pain Management:    Induction:   Airway Management Planned:   Additional Equipment:   Intra-op Plan:   Post-operative Plan:   Informed Consent: I have reviewed the patients History and Physical, chart, labs and discussed the procedure including the risks, benefits and alternatives for the proposed anesthesia with the patient or authorized representative who has indicated his/her understanding and acceptance.     Plan Discussed with:   Anesthesia Plan Comments:         Anesthesia Quick Evaluation

## 2015-02-15 NOTE — H&P (Signed)
CC: LOF since 4 am. Pt notes small leaking of fluid, slight blood tinge since awaking to void at 4 am. Pt notes good FM, did kick count and no contractions.   PMH: thyroid CA s/p thyroidectomy, on synthroid, AMA,   All: NKDA  Meds: PNV, Synthroid  PE: Filed Vitals:   02/15/15 0637 02/15/15 0650 02/15/15 0655  BP:  130/79   Pulse: 72 71   Temp: 98.7 F (37.1 C) 98 F (36.7 C)   TempSrc: Oral Oral   Resp: 16 20   Height:   5\' 8"  (1.727 m)  Weight:   97.523 kg (215 lb)   Gen: well appearing, no distress Abd: soft, NT, ND LE: NT, no edema GU: blood tinged mucous from cvx, pool neg, fern neg. cvx 1-2cm/ 30%/ vtx -2. Membranes palpate intact  NST: rare ctx FH: 130s, + accels, no decels, 10 beat var  Wet prep: RBC present, nl lactobacilli, no clue, fern neg  A/P: 39 yo G1 at 40'0 with concern for ROM. Exam neg for ROM, neg for labor. Check amniosure and if neg d/c home. Reactive fetal status.  Betty Maxim A. 02/15/2015 7:25 AM

## 2015-02-15 NOTE — MAU Note (Signed)
Pt reports leaking fluid since 0345, fluid is pink. Also reports some lower back pain.

## 2015-02-15 NOTE — Progress Notes (Signed)
S: Doing well, no complaints, pain increasing since pitocin started, ready epidural. Continues to leak small amount red tinged fluid.   O: BP 127/73 mmHg  Pulse 67  Temp(Src) 97.9 F (36.6 C) (Oral)  Resp 18  Ht 5\' 8"  (1.727 m)  Wt 97.523 kg (215 lb)  BMI 32.70 kg/m2  LMP 05/11/2014   FHT:  FHR: 140s bpm, variability: moderate,  accelerations:  Present,  decelerations:  Present 2 variables with contractions since AROM forebag UC:   regular, every 2 minutes SVE:   Dilation: 3.5 Effacement (%): 90 Station: -2 Exam by:: Dr.Lajuane Leatham   A / P:  39 y.o.  Obstetric History   G1   P0   T0   P0   A0   TAB0   SAB0   E0   M0   L0    at [redacted]w[redacted]d IOL after SROM, cytotec x 2, now on pitocin  Fetal Wellbeing:  Category I Pain Control:  Epidural  Anticipated MOD:  NSVD  Betty Reyes A. 02/15/2015, 8:29 PM

## 2015-02-16 ENCOUNTER — Encounter (HOSPITAL_COMMUNITY): Payer: Self-pay | Admitting: Obstetrics and Gynecology

## 2015-02-16 LAB — RPR: RPR Ser Ql: NONREACTIVE

## 2015-02-16 MED ORDER — OXYCODONE-ACETAMINOPHEN 5-325 MG PO TABS: 2.0000 | ORAL_TABLET | ORAL | Status: DC | PRN

## 2015-02-16 MED ORDER — IBUPROFEN 600 MG PO TABS
600.0000 mg | ORAL_TABLET | Freq: Four times a day (QID) | ORAL | Status: DC
  Administered 2015-02-16 – 2015-02-18 (×10): 600 mg via ORAL
  Filled 2015-02-16 (×10): qty 1

## 2015-02-16 MED ORDER — SENNOSIDES-DOCUSATE SODIUM 8.6-50 MG PO TABS
2.0000 | ORAL_TABLET | ORAL | Status: DC
  Administered 2015-02-16 – 2015-02-18 (×2): 2 via ORAL
  Filled 2015-02-16 (×2): qty 2

## 2015-02-16 MED ORDER — ONDANSETRON HCL 4 MG PO TABS: 4.0000 mg | ORAL_TABLET | ORAL | Status: DC | PRN

## 2015-02-16 MED ORDER — OXYCODONE-ACETAMINOPHEN 5-325 MG PO TABS: 1.0000 | ORAL_TABLET | ORAL | Status: DC | PRN

## 2015-02-16 MED ORDER — DIPHENHYDRAMINE HCL 25 MG PO CAPS: 25.0000 mg | ORAL_CAPSULE | Freq: Four times a day (QID) | ORAL | Status: DC | PRN

## 2015-02-16 MED ORDER — LEVOTHYROXINE SODIUM 175 MCG PO TABS
262.5000 ug | ORAL_TABLET | Freq: Every day | ORAL | Status: DC
  Administered 2015-02-16 – 2015-02-17 (×2): 175 ug via ORAL
  Filled 2015-02-16 (×2): qty 1.5

## 2015-02-16 MED ORDER — ONDANSETRON HCL 4 MG/2ML IJ SOLN: 4.0000 mg | INTRAMUSCULAR | Status: DC | PRN

## 2015-02-16 MED ORDER — WITCH HAZEL-GLYCERIN EX PADS
1.0000 "application " | MEDICATED_PAD | CUTANEOUS | Status: DC | PRN
  Administered 2015-02-16: 1 via TOPICAL

## 2015-02-16 MED ORDER — RHO D IMMUNE GLOBULIN 1500 UNIT/2ML IJ SOSY
300.0000 ug | PREFILLED_SYRINGE | Freq: Once | INTRAMUSCULAR | Status: AC
  Administered 2015-02-16: 300 ug via INTRAVENOUS
  Filled 2015-02-16: qty 2

## 2015-02-16 MED ORDER — ZOLPIDEM TARTRATE 5 MG PO TABS: 5.0000 mg | ORAL_TABLET | Freq: Every evening | ORAL | Status: DC | PRN

## 2015-02-16 MED ORDER — SIMETHICONE 80 MG PO CHEW: 80.0000 mg | CHEWABLE_TABLET | ORAL | Status: DC | PRN

## 2015-02-16 MED ORDER — TETANUS-DIPHTH-ACELL PERTUSSIS 5-2.5-18.5 LF-MCG/0.5 IM SUSP: 0.5000 mL | Freq: Once | INTRAMUSCULAR | Status: DC

## 2015-02-16 MED ORDER — ACETAMINOPHEN 325 MG PO TABS: 650.0000 mg | ORAL_TABLET | ORAL | Status: DC | PRN

## 2015-02-16 MED ORDER — PRENATAL MULTIVITAMIN CH
1.0000 | ORAL_TABLET | Freq: Every day | ORAL | Status: DC
  Administered 2015-02-16 – 2015-02-18 (×3): 1 via ORAL
  Filled 2015-02-16 (×3): qty 1

## 2015-02-16 MED ORDER — BENZOCAINE-MENTHOL 20-0.5 % EX AERO
1.0000 "application " | INHALATION_SPRAY | CUTANEOUS | Status: DC | PRN
  Administered 2015-02-16: 1 via TOPICAL
  Filled 2015-02-16: qty 56

## 2015-02-16 MED ORDER — LANOLIN HYDROUS EX OINT: TOPICAL_OINTMENT | CUTANEOUS | Status: DC | PRN

## 2015-02-16 MED ORDER — DIBUCAINE 1 % RE OINT
1.0000 "application " | TOPICAL_OINTMENT | RECTAL | Status: DC | PRN
  Administered 2015-02-17: 1 via RECTAL
  Filled 2015-02-16: qty 28

## 2015-02-16 NOTE — Anesthesia Postprocedure Evaluation (Signed)
  Anesthesia Post-op Note  Patient: Betty Reyes  Procedure(s) Performed: * No procedures listed *  Patient Location: Mother/Baby  Anesthesia Type:Epidural  Level of Consciousness: awake, alert  and oriented  Airway and Oxygen Therapy: Patient Spontanous Breathing  Post-op Pain: none  Post-op Assessment: Post-op Vital signs reviewed and Patient's Cardiovascular Status Stable              Post-op Vital Signs: Reviewed and stable  Last Vitals:  Filed Vitals:   02/16/15 0435  BP: 118/59  Pulse: 77  Temp: 37.5 C  Resp: 18    Complications: No apparent anesthesia complications

## 2015-02-16 NOTE — Lactation Note (Signed)
This note was copied from the chart of [place]. Lactation Consultation Note Mom reports baby has just finished feeding Assisted by RN Roselyn Reef- LS 9 Baby asleep in bassinet at present,. Reports no pain with latch. Reviewed cluster feeding and encouraged to take a nap this afternoon. BF brochure given with resources for support after DC. Reviewed BFSG and OP appointments. Mom is [employer] employee and will get pump before DC. No questions at present,.To call for assist prn  Patient Name: Betty Reyes RCVEL'F Date: 02/16/2015 Reason for consult: Initial assessment   Maternal Data Formula Feeding for Exclusion: No Does the patient have breastfeeding experience prior to this delivery?: No  Feeding Feeding Type: Breast Fed  LATCH Score/Interventions Latch: Grasps breast easily, tongue down, lips flanged, rhythmical sucking.  Audible Swallowing: A few with stimulation  Type of Nipple: Everted at rest and after stimulation  Comfort (Breast/Nipple): Soft / non-tender     Hold (Positioning): No assistance needed to correctly position infant at breast.  LATCH Score: 9  Lactation Tools Discussed/Used     Consult Status Consult Status: Follow-up Date: 02/17/15 Follow-up type: In-patient    Truddie Crumble 02/16/2015, 11:53 AM

## 2015-02-17 LAB — RH IG WORKUP (INCLUDES ABO/RH)
ABO/RH(D): B NEG
FETAL SCREEN: NEGATIVE
Gestational Age(Wks): 40
UNIT DIVISION: 0

## 2015-02-17 MED ORDER — LEVOTHYROXINE SODIUM 175 MCG PO TABS
175.0000 ug | ORAL_TABLET | Freq: Every day | ORAL | Status: DC
  Administered 2015-02-18: 175 ug via ORAL
  Filled 2015-02-17: qty 1

## 2015-02-17 NOTE — Progress Notes (Signed)
Patient ID: KHAYA THEISSEN, female   DOB: 04/04/1976, 39 y.o.   MRN: [id number] PPD # 1 SVD  S:  Reports feeling well             Tolerating po/ No nausea or vomiting             Bleeding is moderate             Pain controlled with ibuprofen (OTC)             Up ad lib / ambulatory / voiding without difficulties    Newborn  Information for the patient's newborn:  Kyrianna, Barletta Boy Santrice [[id number]]  female  breast feeding with no difficulties/ Circumcision planning later today   O:  A & O x 3, in no apparent distress              VS:  Filed Vitals:   02/16/15 0435 02/16/15 0850 02/16/15 1724 02/17/15 0621  BP: 118/59 125/60 116/63 110/58  Pulse: 77 99 75 75  Temp: 99.5 F (37.5 C) 97.9 F (36.6 C) 97 F (36.1 C) 97.9 F (36.6 C)  TempSrc: Oral Oral Axillary Axillary  Resp: 18 18 18 18   Height:      Weight:      SpO2: 98% 99%      LABS:  Recent Labs  02/15/15 0855  WBC 11.1*  HGB 13.1  HCT 38.4  PLT 185    Blood type: --/--/B NEG (09/06 0650) / Infant Rh POS / Rhophylac indicated  Rubella: Immune (01/26 0000)   I&O: I/O last 3 completed shifts: In: -  Out: 850 [Urine:600; Blood:250]             Lungs: Clear and unlabored  Heart: regular rate and rhythm / no murmurs  Abdomen: soft, non-tender, non-distended              Fundus: firm, non-tender, U-1  Perineum: 2nd degree repair healing well  Lochia: moderate  Extremities: no edema, no calf pain or tenderness, no Homans    A/P: PPD # 1  39 y.o., G1P1001   Principal Problem:    Postpartum care following vaginal delivery (9/6)  Active Problems:    PROM (premature rupture of membranes)   Doing well - stable status  Routine post partum orders  Anticipate discharge tomorrow    Laury Deep, M, MSN, CNM 02/17/2015, 10:32 AM

## 2015-02-18 DIAGNOSIS — Z6791 Unspecified blood type, Rh negative: Secondary | ICD-10-CM | POA: Diagnosis present

## 2015-02-18 DIAGNOSIS — E039 Hypothyroidism, unspecified: Secondary | ICD-10-CM | POA: Diagnosis present

## 2015-02-18 DIAGNOSIS — O26899 Other specified pregnancy related conditions, unspecified trimester: Secondary | ICD-10-CM | POA: Diagnosis present

## 2015-02-18 MED ORDER — IBUPROFEN 600 MG PO TABS: 600.0000 mg | ORAL_TABLET | Freq: Four times a day (QID) | ORAL | Status: DC

## 2015-02-18 MED ORDER — DIBUCAINE 1 % RE OINT: 1.0000 "application " | TOPICAL_OINTMENT | RECTAL | Status: DC | PRN

## 2015-02-18 NOTE — Discharge Summary (Signed)
Obstetric Discharge Summary Reason for Admission: rupture of membranes and 40.[redacted] weeks gestation Prenatal Procedures: AMA, hypothyroidism, low-lying placenta that resolved, obesity Intrapartum Procedures: spontaneous vaginal delivery and Cytotec, Pitocin, epidural Postpartum Procedures: Rho(D) Ig Complications-Operative and Postpartum: 2nd degree perineal laceration HEMOGLOBIN  Date Value Ref Range Status  02/15/2015 13.1 12.0 - 15.0 g/dL Final   HCT  Date Value Ref Range Status  02/15/2015 38.4 36.0 - 46.0 % Final    Physical Exam:  General: alert, cooperative and no distress Lochia: appropriate Uterine Fundus: firm Incision: healing well, no significant drainage, no dehiscence, no significant erythema DVT Evaluation: No evidence of DVT seen on physical exam. Negative Homan's sign. No cords or calf tenderness. No significant calf/ankle edema.  Discharge Diagnoses: Term Pregnancy-delivered  Discharge Information: Date: 02/18/2015 Activity: pelvic rest Diet: routine Medications: PNV, Ibuprofen and Synthroid Condition: stable Instructions: refer to practice specific booklet Discharge to: home Follow-up Information    Follow up with LAVOIE,MARIE-LYNE, MD. Schedule an appointment as soon as possible for a visit in 6 weeks.   Specialty:  Obstetrics and Gynecology   Contact information:   [place] [address] [phone]       Newborn Data: Live born female on 02/16/15 Birth Weight: 7 lb 9.9 oz (3455 g) APGAR: 8, 9  Home with mother.  Betty Reyes, [place], N 02/18/2015, 9:55 AM

## 2015-02-18 NOTE — Lactation Note (Signed)
This note was copied from the chart of [place]. Lactation Consultation Note  Patient Name: Betty Reyes XAJOI'N Date: 02/18/2015  80 hours old and has been to the breast consistently  Review of doc flow sheets - 7% weight loss, Breast feeding Range 10-50 mins  Latch scores- 7-9's . Last 2 scores- 8-9's, voids and stools QS for age. Bili Check at 47 hours- 7.8. @ the start of the [facility] consult Baby already latched on left breast , football position with depth . Per  Mom having some intermittent discomfort and LC asked if she wanted to release latch and per mom it's  Bearable. Baby ended up feeding 50 mins and noted to be non - nutritive. LC had mom release suction. LC noted The upper portion of moms nipple was slanted and also the surface of the nipple bruised upper portion. LC with a gloved finger assessed the Baby's mouth, upper short labial frenulum, above the gum line, upper lip stretches  With exam and when latched. Short anterior frenulum noted and LC suspects posterior frenulum. LC made mom and dad aware of findings , and suggested they have their Pedis assessed. Also gave mom a handout on Frenulum challenges. LC as recommended mom schedule a F/U with LC O/P within next week , LC offered to set up apt and mom declined today for set up. F/U  phone # and information given to mom with instructions.  Sore nipple and engorgement prevention and tx reviewed - mom already has comfort gels , and has been using them, LC instructed mom on the  shells, has hand pump. Per mom plans to obtain DEBP from Healthy Pregnancy program @ [facility] today.  Mother informed of post-discharge support and given phone number to the lactation department, including services for phone call assistance; out-patient  appointments; and breastfeeding support group. List of other breastfeeding resources in the community given in the handout. Encouraged mother to call for  problems or concerns related to  breastfeeding.     Maternal Data    Feeding Feeding Type: Breast Fed Length of feed: 50 min (multiply swallows noted, increased w/ Breast compressions )  LATCH Score/Interventions Latch:  (already latched with depth )  Audible Swallowing:  (multiply intermittent )  Type of Nipple:  (nipple slightly )              Lactation Tools Discussed/Used     Consult Status      Myer Haff 02/18/2015, 11:55 AM

## 2015-02-18 NOTE — Progress Notes (Signed)
PPD #2- SVD  Subjective:   Reports feeling well, ready for discharge Tolerating po/ No nausea or vomiting Bleeding is light Pain controlled with Motrin Up ad lib / ambulatory / voiding without problems Newborn: breastfeeding  / Circumcision: done   Objective:   VS: VS:  Filed Vitals:   02/16/15 1724 02/17/15 0621 02/17/15 1840 02/18/15 0554  BP: 116/63 110/58 115/65 109/68  Pulse: 75 75 59 53  Temp: 97 F (36.1 C) 97.9 F (36.6 C) 97.8 F (36.6 C) 98.8 F (37.1 C)  TempSrc: Axillary Axillary Oral Oral  Resp: 18 18 18 18   Height:      Weight:      SpO2:        LABS: No results for input(s): WBC, HGB, PLT in the last 72 hours. Blood type: --/--/B NEG (09/06 0650) Rubella: Immune (01/26 0000)                I&O: Intake/Output    None     Physical Exam: Alert and oriented X3 Abdomen: soft, non-tender, non-distended  Fundus: firm, non-tender, U-3 Perineum: Well approximated, no significant erythema, edema, or drainage; healing well. Cluster of 3 large hemorrhoids present. Lochia: small Extremities: No edema, no calf pain or tenderness    Assessment: PPD #2  G1P1001/ S/P:spontaneous vaginal, 2nd degree laceration Hypothyroidism Rh negative-Rhogam given Doing well - stable for discharge home   Plan: Discharge home RX's:  Ibuprofen 600mg  po Q 6 hrs prn pain #30 Refill x 0 Dibucaine 1%, 1 tube, no refill Routine pp visit in 6wks at [place] given    Julianne Handler, N MSN, CNM 02/18/2015, 9:51 AM

## 2015-02-19 LAB — TYPE AND SCREEN
ABO/RH(D): B NEG
Antibody Screen: POSITIVE
DAT, IGG: NEGATIVE
UNIT DIVISION: 0
Unit division: 0

## 2015-05-04 LAB — T4, FREE: FREE T4: 1.95 ng/dL — AB (ref 0.80–1.80)

## 2015-05-04 LAB — T3, FREE: T3, Free: 3.2 pg/mL (ref 2.3–4.2)

## 2015-05-04 LAB — THYROGLOBULIN ANTIBODY PANEL
THYROID PEROXIDASE ANTIBODY: 30 [IU]/mL — AB (ref <9)
Thyroglobulin: <0.1 ng/mL — ABNORMAL LOW (ref 2.8–40.9)

## 2015-05-04 LAB — TSH: TSH: 0.162 u[IU]/mL — ABNORMAL LOW (ref 0.350–4.500)

## 2015-05-10 ENCOUNTER — Telehealth: Payer: Self-pay | Admitting: "Endocrinology

## 2015-05-10 NOTE — Telephone Encounter (Signed)
1. I contacted the patient about her recent lab results. 2. Her anti-thyroglobulin antibody and her thyroglobulin are still suppressed. Her TFTs are higher, however, indicating that she needs less Synthroid. I asked her to reduce her Synthroid dose to 175 mg/day on 6 days of each week, but to skip the 7th day of each week. 3. She will see me in follow up in two more days.  Sherrlyn Hock

## 2015-05-12 ENCOUNTER — Encounter: Payer: Self-pay | Admitting: "Endocrinology

## 2015-05-12 ENCOUNTER — Ambulatory Visit (INDEPENDENT_AMBULATORY_CARE_PROVIDER_SITE_OTHER): Payer: 59 | Admitting: "Endocrinology

## 2015-05-12 VITALS — BP 110/63 | HR 69 | Wt 204.0 lb

## 2015-05-12 DIAGNOSIS — C73 Malignant neoplasm of thyroid gland: Secondary | ICD-10-CM

## 2015-05-12 DIAGNOSIS — E669 Obesity, unspecified: Secondary | ICD-10-CM

## 2015-05-12 DIAGNOSIS — E0580 Other thyrotoxicosis without thyrotoxic crisis or storm: Secondary | ICD-10-CM | POA: Diagnosis not present

## 2015-05-12 NOTE — Patient Instructions (Signed)
Follow up visit in 6 months. Repeat TFTS in 2 months. Repeat TFTS and Tg panel one week prior to next visit.

## 2015-05-12 NOTE — Progress Notes (Signed)
CC: FU papillary thyroid cancer, post-operative hypothyroidism, iatrogenic hyperthyroidism, fatigue, bradycardia, overweight/obesity, previous infertility, and current pregnancy  A. HPI: Betty Reyes is a 39 y.o. Caucasian woman who is unaccompanied today.  1. Betty Reyes was diagnosed with hypothyroidism, presumably secondary to Hashimoto's Disease, at some time prior to 2010. She was treated with Synthroid, 50 mcg per day. In the Summer of 2010 a goiter was noted.  A thyroid US showed multiple nodules bilaterally, with the largest being 2.6 cm in diameter. Betty Reyes was referred to Dr. Stark Klein, a local general surgeon, for further evaluation and management. Dr. Barry Dienes performed a fine needle aspiration, which reportedly showed Hashimoto's Disease. A follow-up thyroid US in the Summer of 2011 showed that the previously aspirated nodule and two other nodules had enlarged during the year. Dr. Barry Dienes performed a total thyroidectomy on 05/19/10. 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 The largest microfocus was in the superior right lobe, measured 4 mm in diameter, invaded the capsule, but did not extend extrathyroidally.     2. I saw the patient and her husband in consultation on 121/16/11. Clinically she showed no evidence of active thyroid cancer. I reviewed her surgical notes and pathology reports with the patient and her husband. I also discussed with them the latest clinical guidelines on the management of thyroid cancer from the American Thyroid Association. We discussed the options for therapy, including whether or not to give radioactive iodine therapy (RAI). Although microfocal cancers usually have a low risk of recurrence or death, her case had two factors that would potentially increase her risks. First, the fact that she had multiple foci in both lobes. Second, the fact that one microfocus did invade the thyroid  capsule and might have spread extrathyroidally, although the pathologist did not see evidence of extrathyroidal extension on the slides he reviewed.  I asked the patient to take oral contraceptives from that point until she either decided not to take RAI or until at least one year after receiving RAI. She agreed.  3.  On 06/14/10 I met with Betty Reyes and her husband again and we had another full and open discussion about the advantages and disadvantages of using RAI. Since the Hennises did not yet have children, and since neither of them wanted to leave a cancer untreated when a potentially curative treatment such as RAI was available, they wanted to proceed with RAI as soon as possible. Since Betty Reyes had not been on thyroid hormone replacement since surgery, she chose to receive RAI while undergoing thyroid hormone withdrawal and while following a strict low iodine diet. On 06/27/10, Betty Reyes received 50 millicuries of Q-947. Her post-RAI scan showed uptake in the thyroid bed, but no spread outside the thyroid bed. As I explained to the Hennises, the I-131 uptake could have been due to the presence of some residual thyroid cells, or to some thyroid cancer cells, or both.   4. The patient has done well clinically since her I-131 therapy. She has had no evidence of recurrence on serial physical exams. Her thyroglobulin (Tg) levels have progressively decreased. On 06/01/10 the post-operative Tg level was 20.8. On 06/14/10 the pre-I-131 Tg level was 2.9. She was hypothyroid for both of these Tg values. On 09/08/10 the post-I-131 Tg level was <0.2. At that point [name] was taking Synthroid, 175 mcg/day. Her unstimulated thyroglobulin levels have remained < 0.2 ever since.  5. Ms Reyes' last clinic visit with me was on 02/02/15. In the interim she has been healthy. She is not having much problem with fatigue at this point.    A. Her pregnancy progressed without any problems and she delivered a healthy  baby boy on 02/16/15. Breast feeding did not go well, so she is now bottle feeding the baby.   B. She remained on Synthroid, 175 mcg/day during the pregnancy. However, when her TFTs on 05/03/15 showed that she was more hyperthyroid than I wanted her to be, I contacted her on 05/10/15 and asked her to reduce the Synthroid dose to 175 mcg per day, for 6 days per week. .   C. Her allergies have been quiescent. She has not had any vertigo. 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   6. Pertinent Review of Systems: Constitutional. The patient feels "good", but "normally tired for the mother of a 66-monthold baby".She has no significant complaints that pertain to today's visit.  Sleep: The patient usually sleeps well when she is not getting up frequently with the baby. Body temperature: The patient's body temperature seems to be normal overall. Weight: Weight has decreased from 214 pounds to 204 pounds.   Eyes: The patient's vision is good. She is not aware of any eye problems. Neck: The patient is not aware of any lumps or masses in her anterior neck and thyroid bed.  There have been no significant other problems with  pressure, discomfort, or difficulty swallowing. Heart: The patient feels the expected increase in heart rate during exercise or other physical activities. There have been no significant problems with palpitations, irregular heart beats, chest pain, or chest pressure. Gastrointestinal: She has no GI complaints today. Bowel movements are normal. There are no significant complaints of excessive hunger, upset stomach, stomach aches or pains, diarrhea, or constipation. Musculoskeletal: Muscles and extremities appear to be working normally. There are no significant problems with hand tremor, sweaty palms, palmar erythema, or lower leg swelling. Psychological: Mood and psychological responses seem to be normal for a woman who delivered a new baby almost three months ago.   Mental:  The patient's abilities to think, to pay attention, to remember, and to make decisions have normalized since being on Synthroid therapy. GYN: Her LMP was 04/14/15.  She and her husband are using condoms.   PAST MEDICAL, FAMILY, AND SOCIAL HISTORY: 1. 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 Activities: She is not walking much now.  3. [place] care [place]. OB[facility]: [facility] 5. Surgeon: [facility]. [facility]Stark [facility]  3. REVIEW OF SYSTEMS: Ms. [place] no significant problems related to any of her other body systems  PHYSICAL EXAM: BP 110/63 mmHg  Pulse 69  Wt 204 lb (92.534 kg) She has lost 10 pounds since her last visit.   Constitutional: The patient looks healthy and appears physically and emotionally well. She looks quite good. She was very excited to show me the pictures of her new baby.   Eyes: There is no arcus or proptosis.  Mouth: The oropharynx appears normal. The tongue appears normal. There is normal oral moisture. There is no obvious gingivitis. She does have her mild grade I mustache today.  Neck: There are no bruits present. The thyroid gland is absent. There continues to be some residual induration of the thyroid bed over the  strap muscles, more on the left side than on the right. There is no evidence of masses or nodes in the thyroid bed or supraclavicular areas. There is no tenderness to palpation. Lungs: The lungs are clear. Air movement is good. Heart: The heart rhythm and rate appear normal. Heart sounds S1 and S2 are normal. I do not appreciate any pathologic heart murmurs. Abdomen: The abdomen is enlarged, consistent with her stage of pregnancy. Bowel sounds are normal. The abdomen is soft and non-tender. There is no obviously palpable hepatomegaly, splenomegaly, or other masses.  Arms: Muscle mass appears appropriate for age.  Hands:  There is no obvious tremor. Phalangeal and metacarpophalangeal joints appear normal. Palms are normal. Legs: Muscle mass appears appropriate for age. There is no edema.  Neurologic: Muscle strength is normal for age and gender  in both the upper and the lower extremities. Muscle tone appears normal. Sensation to touch is normal in the legs.  Labs:   Labs 05/03/15: TSH 0.162, free T4 1.95, free T3 3.2, thyroglobulin (Tg) < 0.1, anti-Tg antibody < 1  Labs 01/22/15: TSH 0.529, free T4 1.29, free T3 2.8  Labs 12/16/14: TSH 0.811, free T4 1.00, free T3 2.4  Labs 11/05/14: TSH 0.207, free T4 1.17, free T3 2.7  Labs 08/08/13: TSH 2.325, free T4 1.08, free T3 2.9  Labs 05/30/14: Thyroglobulin < 0.1, anti-thyroglobulin antibody <1, TPO antibody 37, TSH 0.209, free T4 1.29, free T3 3.5  Labs 11/19/13: TSH 0.217, free T4 1.73, free T3 3.4, thyroglobulin <0.2, thyroglobulin antibody < 20  Labs 09/18/13: TSH 0.137, free T4 1.70, free T3 3.4  Labs 05/30/13: TSH 1.422, free T4 1.51, free T3 3.1, Thyroglobulin <0.2, thyroglobulin antibody <20  Labs 02/19/13: TSH 1.734, free T4 1.35, free T3 3.2, TPO antibody 40.5, Tg antibody < 20, thyroglobulin < 0.2  Labs 10/01/12: TSH 0.312, free T4 1.66, free T3 3.0, TPO antibody 44.2, thyroglobulin < 0.2, anti-thyroglobulin antibody < 20.  Labs 10/23/11: TSH 0.214, free T4 1.54, free T3 3.2, thyroglobulin < 0.2, anti-thyroglobulin antibody < 20  Labs 08/21/11: TSH 17.234, free T4 1.73, free T3 3.3, Thyrogen-stimulated thyroglobulin (Tg) < 0.2 (undetectable). Anti-thyroglobulin antibody < 20 (normal).  Labs 04/20/11: Thyroglobulin (Tg) < 0.2 (undetectable), anti-thyroglobulin antibody (Tg Ab) < 20   Labs 04/04/11: TSH 0.154, free T4 1.57, free T3 3.1  ASSESSMENT: 1. Papillary thyroid cancer:   A. Her exam is normal today. There is still no clinical or lab evidence of recurrence.   B. As noted above, her Thyrogen-stimulated thyroglobulin (Tg) levels and unstimulated  Tg levels have been unmeasurable since March 2012. She has no chemical evidence for recurrence.   C. At this point it appears that Ms. Dziedzic' PTC is in remission. She may well be cured, but she will need continuing follow-up of her PTC for at least the next two years. 2. Hypothyroid/hyperthyroid:   A. Her TSH had been suppressed iatrogenically since 2012.   B. Her current TFTs indicate that her thyroid hormone levels are higher than I had wanted them to be. I therefore reduced her Synthroid dose to 175 mcg/day for 6 days out of each week.  3. Fatigue: She does not seem to be any more fatigued than the average woman who is taking care of anew baby.  4. Bradycardia: This problem has resolved.  5. Obesity: She had become more obese during the pregnancy. She will now need to work harder to reduce her excess weight.  6. Hirsutism: Her increase in fat cell  weight has caused some increase in insulin resistance and a compensatory increase in insulin production. The hyperinsulinemia, in turn, has caused more production of testosterone from her ovaries and androstenedione from her adrenal glands. Reducing her fat volume should result in reducing her androgen levels.       PLAN: 1. Diagnostic: I ordered repeat TFTs in two months.  TFTS and Tg panel one week prior to next appointment. 2. Therapeutic: Take Synthroid 175 mcg daily for 6 days out of each week. Adjust the Synthroid doses as needed.  3. Patient education: We discussed the issues of gradually returning her to a near-euthyroid state and obesity.   4. Follow-up: Follow-up appointment in 6 months.  Level of Service: This visit lasted in excess of 50 minutes. More than 50% of the visit was devoted to counseling.  Sherrlyn Hock

## 2015-07-10 DIAGNOSIS — C73 Malignant neoplasm of thyroid gland: Secondary | ICD-10-CM | POA: Diagnosis not present

## 2015-07-10 DIAGNOSIS — E0580 Other thyrotoxicosis without thyrotoxic crisis or storm: Secondary | ICD-10-CM | POA: Diagnosis not present

## 2015-07-10 LAB — TSH: TSH: 0.19 u[IU]/mL — ABNORMAL LOW (ref 0.350–4.500)

## 2015-07-10 LAB — T3, FREE: T3 FREE: 3.4 pg/mL (ref 2.3–4.2)

## 2015-07-10 LAB — T4, FREE: Free T4: 1.65 ng/dL (ref 0.80–1.80)

## 2015-07-19 ENCOUNTER — Telehealth: Payer: Self-pay | Admitting: "Endocrinology

## 2015-07-19 NOTE — Telephone Encounter (Signed)
1. I called the patient to discuss her recent thyroid tests. 2. Her TSH is still lower than I would like. Her free T4 and free T3 are better. 3. Change the Synthroid to 175 on 4 days per week, but take 1/2 tab on Monday-Wednesday, and Friday. Betty Reyes

## 2015-08-16 ENCOUNTER — Other Ambulatory Visit: Payer: Self-pay | Admitting: "Endocrinology

## 2015-08-16 MED FILL — SYNTHROID 175 MCG TABLET: 175 | 90 days supply | Qty: 135 | Fill #0

## 2015-10-19 ENCOUNTER — Telehealth: Payer: Self-pay | Admitting: "Endocrinology

## 2015-10-19 DIAGNOSIS — C73 Malignant neoplasm of thyroid gland: Secondary | ICD-10-CM

## 2015-10-26 DIAGNOSIS — C73 Malignant neoplasm of thyroid gland: Secondary | ICD-10-CM | POA: Diagnosis not present

## 2015-10-26 NOTE — Telephone Encounter (Signed)
I discussed with the patient her upcoming visit. I also ordered lab tests for her.  Betty Reyes

## 2015-10-27 LAB — TSH: TSH: 0.96 mIU/L

## 2015-10-27 LAB — THYROGLOBULIN ANTIBODY PANEL
Thyroglobulin Ab: <1 IU/mL (ref <2)
Thyroglobulin: <0.1 ng/mL — ABNORMAL LOW (ref 2.8–40.9)
Thyroperoxidase Ab SerPl-aCnc: 23 IU/mL — ABNORMAL HIGH (ref <9)

## 2015-10-27 LAB — T3, FREE: T3 FREE: 2.8 pg/mL (ref 2.3–4.2)

## 2015-10-27 LAB — T4, FREE: FREE T4: 1.2 ng/dL (ref 0.8–1.8)

## 2015-11-09 ENCOUNTER — Ambulatory Visit (INDEPENDENT_AMBULATORY_CARE_PROVIDER_SITE_OTHER): Payer: 59 | Admitting: "Endocrinology

## 2015-11-09 ENCOUNTER — Encounter: Payer: Self-pay | Admitting: "Endocrinology

## 2015-11-09 VITALS — BP 120/76 | HR 67 | Wt 199.2 lb

## 2015-11-09 DIAGNOSIS — E890 Postprocedural hypothyroidism: Secondary | ICD-10-CM

## 2015-11-09 DIAGNOSIS — L680 Hirsutism: Secondary | ICD-10-CM

## 2015-11-09 DIAGNOSIS — E0580 Other thyrotoxicosis without thyrotoxic crisis or storm: Secondary | ICD-10-CM

## 2015-11-09 DIAGNOSIS — E669 Obesity, unspecified: Secondary | ICD-10-CM | POA: Diagnosis not present

## 2015-11-09 DIAGNOSIS — C73 Malignant neoplasm of thyroid gland: Secondary | ICD-10-CM | POA: Diagnosis not present

## 2015-11-09 MED ORDER — SYNTHROID 125 MCG PO TABS: ORAL_TABLET | ORAL | Status: DC

## 2015-11-09 MED FILL — SYNTHROID 125 MCG TABLET: 125 | 90 days supply | Qty: 90 | Fill #0

## 2015-11-09 NOTE — Patient Instructions (Signed)
Follow up visit in one year. Please repeat thyroid blood tests in 3 months. When you become pregnant, repeat the thyroid blood tests immediately and call Dr. Tobe Sos immediately.

## 2015-11-09 NOTE — Progress Notes (Signed)
CC: FU papillary thyroid cancer, post-operative hypothyroidism, iatrogenic hyperthyroidism, fatigue, bradycardia, overweight/obesity, and previous infertility.  A. HPI: Betty Reyes is a 40 y.o. Caucasian woman who is accompanied by her almost 24 month-old baby boy today.  1. Betty Reyes was diagnosed with hypothyroidism, presumably secondary to Hashimoto's Disease, at some time prior to 2010. She was treated with Synthroid, 50 mcg per day. In the Summer of 2010 a goiter was noted.  A thyroid US showed multiple nodules bilaterally, with the largest being 2.6 cm in diameter. Betty Reyes was referred to Dr. Stark Klein, a local general surgeon, for further evaluation and management. Dr. Barry Dienes performed a fine needle aspiration, which reportedly showed Hashimoto's Disease. A follow-up thyroid US in the Summer of 2011 showed that the previously aspirated nodule and two other nodules had enlarged during the year. Dr. Barry Dienes performed a total thyroidectomy on 05/19/10. 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 The largest microfocus was in the superior right lobe, measured 4 mm in diameter, invaded the capsule, but did not extend extrathyroidally.     2. I saw the patient and her husband in consultation on 121/16/11. Clinically she showed no evidence of active thyroid cancer. I reviewed her surgical notes and pathology reports with the patient and her husband. I also discussed with them the latest clinical guidelines on the management of thyroid cancer from the [employer]. We discussed the options for therapy, including whether or not to give radioactive iodine therapy (RAI). Although microfocal cancers usually have a low risk of recurrence or death, her case had two factors that would potentially increase her risks. First, the fact that she had multiple foci in both lobes. Second, the fact that one microfocus did  invade the thyroid capsule and might have spread extrathyroidally, although the pathologist did not see evidence of extrathyroidal extension on the slides he reviewed.  I asked the patient to take oral contraceptives from that point until she either decided not to take RAI or until at least one year after receiving RAI. She agreed.  3.  On 06/14/10 I met with Betty Reyes and her husband again and we had another full and open discussion about the advantages and disadvantages of using RAI. Since the Hennises did not yet have children, and since neither of them wanted to leave a cancer untreated when a potentially curative treatment such as RAI was available, they wanted to proceed with RAI as soon as possible. Since Betty Reyes had not been on thyroid hormone replacement since surgery, she chose to receive RAI while undergoing thyroid hormone withdrawal and while following a strict low iodine diet. On 06/27/10, Betty. Safley received 50 millicuries of P-536. Her post-RAI scan showed uptake in the thyroid bed, but no spread outside the thyroid bed. As I explained to the Hennises, the I-131 uptake could have been due to the presence of some residual thyroid cells, or to some thyroid cancer cells, or both.   4. The patient has done well clinically since her I-131 therapy. She has had no evidence of recurrence on serial physical exams. Her thyroglobulin (Tg) levels have progressively decreased. On 06/01/10 the post-operative Tg level was 20.8. On 06/14/10 the pre-I-131 Tg level was 2.9. She was hypothyroid for both of these Tg values. On 09/08/10 the post-I-131 Tg level was <0.2. At that point Betty Reyes was taking Synthroid, 175 mcg/day. Her unstimulated thyroglobulin levels have remained <  0.2 ever since.  5. Betty Reyes' last clinic visit with me was on 05/12/15. In the interim she has been healthy.   A. Her fatigue has resolved.     B. In February 2017 after reviewing her lab results, I reduced her Synthroid dose to  175 mcg/day on 4 days each week, but 1/2 pill/day on three days per week, for example, M-W-F.   C. Her allergies have been quiescent. She has not had any vertigo. 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   6. Pertinent Review of Systems: Constitutional. The patient feels "good". She has no significant complaints that pertain to today's visit.  Sleep: The patient usually sleeps well when she is not getting up frequently with the baby. Body temperature: The patient's body temperature seems to be normal overall. Weight: Weight has decreased from 204 pounds to 199 pounds.   Eyes: The patient's vision is good. She is not aware of any eye problems. Neck: The patient is not aware of any lumps or masses in her anterior neck and thyroid bed.  There have been no significant other problems with  pressure, discomfort, or difficulty swallowing. Heart: The patient feels the expected increase in heart rate during exercise or other physical activities. There have been no significant problems with palpitations, irregular heart beats, chest pain, or chest pressure. Gastrointestinal: She has no GI complaints today. Bowel movements are normal. There are no significant complaints of excessive hunger, upset stomach, stomach aches or pains, diarrhea, or constipation. Musculoskeletal: Muscles and extremities appear to be working normally. There are no significant problems with hand tremor, sweaty palms, palmar erythema, or lower leg swelling. Psychological: Mood is "fine".    Mental: The patient's abilities to think, to pay attention, to remember, and to make decisions have normalized since being on Synthroid therapy. GYN: Her LMP was 10/15/15. She and her husband are now trying to conceive again.    PAST MEDICAL, FAMILY, AND SOCIAL HISTORY: 1. Work and family: She works full-time as a Writer at [employer]. 2. Activities: She is walking much more often.  3. Primary care provider: Dr. Charletta Cousin in  [place] 4. OB/GYN: Dr. Princess Bruins, [facility] 5. Surgeon: Dr. Stark Klein, [facility]  3. REVIEW OF SYSTEMS: Betty. Fojtik has no significant problems related to any of her other body systems  PHYSICAL EXAM: BP 120/76 mmHg  Pulse 67  Wt 199 lb 3.2 oz (90.357 kg) She has lost 5 pounds since her last visit.   Constitutional: She looks healthy and appears physically and emotionally well. She looks quite good. She was very excited to show me how well her little boy is growing and developing.  Eyes: There is no arcus or proptosis.  Mouth: The oropharynx appears normal. The tongue appears normal. There is normal oral moisture. There is no obvious gingivitis. She does have her mild grade I mustache today.  Neck: There are no bruits present. The thyroid gland is absent. There continues to be some residual induration of the thyroid bed over the strap muscles, more on the left side than on the right. There is no evidence of masses or nodes in the thyroid bed or supraclavicular areas. There is no tenderness to palpation. Lungs: The lungs are clear. Air movement is good. Heart: The heart rhythm and rate appear normal. Heart sounds S1 and S2 are normal. I do not appreciate any pathologic heart murmurs. Abdomen: The abdomen is enlarged, consistent with her stage of  pregnancy. Bowel sounds are normal. The abdomen is soft and non-tender. There is no obviously palpable hepatomegaly, splenomegaly, or other masses.  Arms: Muscle mass appears appropriate for age.  Hands: There is no obvious tremor. Phalangeal and metacarpophalangeal joints appear normal. Palms are normal. Legs: Muscle mass appears appropriate for age. There is no edema.  Neurologic: Muscle strength is normal for age and gender  in both the upper and the lower extremities. Muscle tone appears normal. Sensation to touch is normal in the legs.  Labs:   Labs 10/26/15: TSH 0.96, free T4 1.2, free T3 2.8,  thyroglobulin <0.1, anti-thyroglobulin antibody <1   Labs 07/10/15: TSH 0.190, free T4 2.4, free T3 3.4  Labs 05/03/15: TSH 0.162, free T4 1.95, free T3 3.2, thyroglobulin (Tg) < 0.1, anti-Tg antibody < 1  Labs 01/22/15: TSH 0.529, free T4 1.29, free T3 2.8  Labs 12/16/14: TSH 0.811, free T4 1.00, free T3 2.4  Labs 11/05/14: TSH 0.207, free T4 1.17, free T3 2.7  Labs 08/08/13: TSH 2.325, free T4 1.08, free T3 2.9  Labs 05/30/14: Thyroglobulin < 0.1, anti-thyroglobulin antibody <1, TPO antibody 37, TSH 0.209, free T4 1.29, free T3 3.5  Labs 11/19/13: TSH 0.217, free T4 1.73, free T3 3.4, thyroglobulin <0.2, thyroglobulin antibody < 20  Labs 09/18/13: TSH 0.137, free T4 1.70, free T3 3.4  Labs 05/30/13: TSH 1.422, free T4 1.51, free T3 3.1, Thyroglobulin <0.2, thyroglobulin antibody <20  Labs 02/19/13: TSH 1.734, free T4 1.35, free T3 3.2, TPO antibody 40.5, Tg antibody < 20, thyroglobulin < 0.2  Labs 10/01/12: TSH 0.312, free T4 1.66, free T3 3.0, TPO antibody 44.2, thyroglobulin < 0.2, anti-thyroglobulin antibody < 20.  Labs 10/23/11: TSH 0.214, free T4 1.54, free T3 3.2, thyroglobulin < 0.2, anti-thyroglobulin antibody < 20  Labs 08/21/11: TSH 17.234, free T4 1.73, free T3 3.3, Thyrogen-stimulated thyroglobulin (Tg) < 0.2 (undetectable). Anti-thyroglobulin antibody < 20 (normal).  Labs 04/20/11: Thyroglobulin (Tg) < 0.2 (undetectable), anti-thyroglobulin antibody (Tg Ab) < 20   Labs 04/04/11: TSH 0.154, free T4 1.57, free T3 3.1  Labs 08/19/10: Thyroglobulin <0.2. Thyroglobulin antibody <20.  ASSESSMENT: 1. Papillary thyroid cancer:   A. Her exam is normal today. There is still no clinical or lab evidence of recurrence.   B. As noted above, her Thyrogen-stimulated thyroglobulin (Tg) levels and unstimulated Tg levels have been unmeasurable since March 2012. She has no chemical evidence for recurrence.   C. At this point, 5 years after her I-131 therapy, it appears that Betty. Weil' PTC is  in remission and is probably cured. She will need continuing follow-up of her PTC annually, but may need adjustment of her Synthroid dose every 6 months.  2. Hypothyroid/hyperthyroid:   A. Her TSH had been suppressed iatrogenically since 2012.   B. Her current TFTs indicate that her TSH level is a bit higher than I had wanted it to be. She needs a bit more Synthroid to achieve a TSH goal of about 0.5-0.7. Converting her to 125 mcg/day will make the dosage easier for her.  3. Fatigue: Resolved  4. Bradycardia: This problem has resolved.  5. Obesity: She had continued to work at losing weight and has been successful. She will now need to continue her weight loss efforts.   6. Hirsutism: Her fat cell weight has caused some increase in insulin resistance and a compensatory increase in insulin production. The hyperinsulinemia, in turn, has caused more production of testosterone from her ovaries and androstenedione from her adrenal glands. Reducing  her fat volume should result in reducing her androgen levels.       PLAN: 1. Diagnostic: I ordered repeat TFTs in 3 months, but immediately when she becomes pregnant. Repeat TFTs and Tg panel one week prior to next appointment. 2. Therapeutic: Take Synthroid 125 mcg daily. Adjust the Synthroid doses as needed.  3. Patient education: We discussed the issues of gradually returning her to a near-euthyroid state and obesity.  We also discussed resuming bimonthly TFTs and follow up exams once she becomes pregnant again.  4. Follow-up: Follow-up appointment in 12 months.  Level of Service: This visit lasted in excess of 50 minutes. More than 50% of the visit was devoted to counseling.  Sherrlyn Hock

## 2016-01-25 DIAGNOSIS — C73 Malignant neoplasm of thyroid gland: Secondary | ICD-10-CM | POA: Diagnosis not present

## 2016-01-25 DIAGNOSIS — E0580 Other thyrotoxicosis without thyrotoxic crisis or storm: Secondary | ICD-10-CM | POA: Diagnosis not present

## 2016-01-25 DIAGNOSIS — E890 Postprocedural hypothyroidism: Secondary | ICD-10-CM | POA: Diagnosis not present

## 2016-01-26 LAB — T3, FREE: T3 FREE: 3 pg/mL (ref 2.3–4.2)

## 2016-01-26 LAB — T4, FREE: Free T4: 1.2 ng/dL (ref 0.8–1.8)

## 2016-01-26 LAB — TSH: TSH: 8.39 mIU/L — ABNORMAL HIGH

## 2016-02-08 MED FILL — SYNTHROID 125 MCG TABLET: 125 | 90 days supply | Qty: 90 | Fill #1

## 2016-02-27 ENCOUNTER — Telehealth: Payer: Self-pay | Admitting: "Endocrinology

## 2016-02-27 DIAGNOSIS — E890 Postprocedural hypothyroidism: Secondary | ICD-10-CM

## 2016-02-27 MED ORDER — SYNTHROID 137 MCG PO TABS: 137.0000 ug | ORAL_TABLET | Freq: Every day | ORAL | 6 refills | Status: DC

## 2016-02-27 NOTE — Telephone Encounter (Signed)
1. I called the patient to tell her that her most recent TFTs are low. We need to increase her synthroid dose to 137 mcg/day. 2. Since she was not available. I left a voicemail message with the above information. Betty Reyes

## 2016-02-27 NOTE — Telephone Encounter (Signed)
1. I called the patient to discuss her recent lab results. She returned my call.  2. Objective: The TFTs drawn last month don't make intrinsic sense. We need to repeat them. 3. Assessment: We need to repeat her TFTs.  4. Plan: TFTs this coming week. Sherrlyn Hock

## 2016-03-15 DIAGNOSIS — E890 Postprocedural hypothyroidism: Secondary | ICD-10-CM | POA: Diagnosis not present

## 2016-03-16 LAB — T4, FREE: FREE T4: 1.6 ng/dL (ref 0.8–1.8)

## 2016-03-16 LAB — TSH: TSH: 7.68 m[IU]/L — AB

## 2016-03-16 LAB — T3, FREE: T3, Free: 3.1 pg/mL (ref 2.3–4.2)

## 2016-03-23 ENCOUNTER — Encounter (INDEPENDENT_AMBULATORY_CARE_PROVIDER_SITE_OTHER): Payer: Self-pay | Admitting: *Deleted

## 2016-05-22 MED FILL — SYNTHROID 125 MCG TABLET: 125 | 90 days supply | Qty: 90 | Fill #2

## 2016-06-12 NOTE — L&D Delivery Note (Signed)
Delivery Note At  a viable female infant was delivered over intact perineum via svd. (Presentation: ;  ).  APGAR: , ; weight  .   Placenta status: cord removed with membrane only; manual extraction and 2 large portions of placenta removed, slightly adherent, no further portion of placenta removed but suspicion for further placenta retained and u/s performed confirming presence of placenta.  Cord:  with the following complications: body cord, loose; appears velamentous cord insertion .  Reviewed retained placenta and recommendation for D&C; reviewed risks including but not limited to bleeding, infection, uterine perforation, incomplete removal of placenta though less likely as u/s guided procedure Anesthesia:  epidural Episiotomy:  none Lacerations:  minimal 1st degree, hemostatic Suture Repair: n/a Est. Blood Loss (mL):  49ml   Proceed to OR for procedure.   Charyl Bigger 02/25/2017, 7:31 AM

## 2016-06-21 DIAGNOSIS — Z3201 Encounter for pregnancy test, result positive: Secondary | ICD-10-CM | POA: Diagnosis not present

## 2016-06-22 ENCOUNTER — Telehealth (INDEPENDENT_AMBULATORY_CARE_PROVIDER_SITE_OTHER): Payer: Self-pay

## 2016-06-22 NOTE — Telephone Encounter (Signed)
Please have Betty Reyes come in next week. We'll do her labs at that time.

## 2016-06-22 NOTE — Telephone Encounter (Signed)
Forwarded to Dr. Brennan.  

## 2016-06-22 NOTE — Telephone Encounter (Signed)
  Who's calling (name and relationship to patient) :Tineka  Best contact number:[phone]  Provider they HC:[id number]  Reason for call:Patient is pregnant. 5 weeks. Patient wants to know when she needs to start coming in to have thyroid monitored.     PRESCRIPTION REFILL ONLY  Name of prescription:  Pharmacy:

## 2016-06-23 NOTE — Telephone Encounter (Signed)
Please schedule

## 2016-06-23 NOTE — Telephone Encounter (Signed)
Voicemail left for patient advising to call and schedule.

## 2016-06-27 ENCOUNTER — Ambulatory Visit (INDEPENDENT_AMBULATORY_CARE_PROVIDER_SITE_OTHER): Payer: 59 | Admitting: "Endocrinology

## 2016-06-27 VITALS — BP 106/62 | HR 78 | Ht 67.13 in | Wt 196.8 lb

## 2016-06-27 DIAGNOSIS — R001 Bradycardia, unspecified: Secondary | ICD-10-CM

## 2016-06-27 DIAGNOSIS — E6609 Other obesity due to excess calories: Secondary | ICD-10-CM | POA: Diagnosis not present

## 2016-06-27 DIAGNOSIS — E890 Postprocedural hypothyroidism: Secondary | ICD-10-CM

## 2016-06-27 DIAGNOSIS — C73 Malignant neoplasm of thyroid gland: Secondary | ICD-10-CM

## 2016-06-27 LAB — T3, FREE: T3 FREE: 3.2 pg/mL (ref 2.3–4.2)

## 2016-06-27 LAB — T4, FREE: FREE T4: 1.3 ng/dL (ref 0.8–1.8)

## 2016-06-27 LAB — TSH: TSH: 5.43 mIU/L — ABNORMAL HIGH

## 2016-06-27 NOTE — Patient Instructions (Signed)
Follow up visit in 2 months.  

## 2016-06-27 NOTE — Progress Notes (Signed)
CC: FU papillary thyroid cancer, post-operative hypothyroidism, iatrogenic hyperthyroidism, fatigue, bradycardia, overweight/obesity, previous infertility, and new pregnancy.  A. HPI: Ms. Wartman is a 41 y.o. Caucasian woman who is accompanied by her husband.  1. Ms. Grilliot was diagnosed with hypothyroidism, presumably secondary to Hashimoto's Disease, at some time prior to 2010. She was treated with Synthroid, 50 mcg per day. In the Summer of 2010 a goiter was noted.  A thyroid US showed multiple nodules bilaterally, with the largest being 2.6 cm in diameter. Ms. Mcweeney was referred to Dr. Stark Klein, a local general surgeon, for further evaluation and management. Dr. Barry Dienes performed a fine needle aspiration, which reportedly showed Hashimoto's Disease. A follow-up thyroid US in the Summer of 2011 showed that the previously aspirated nodule and two other nodules had enlarged during the year. Dr. Barry Dienes performed a total thyroidectomy on 05/19/10. 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 The largest microfocus was in the superior right lobe, measured 4 mm in diameter, invaded the capsule, but did not extend extrathyroidally.     2. I saw the patient and her husband in consultation on 121/16/11. Clinically she showed no evidence of active thyroid cancer. I reviewed her surgical notes and pathology reports with the patient and her husband. I also discussed with them the latest clinical guidelines on the management of thyroid cancer from the [employer]. We discussed the options for therapy, including whether or not to give radioactive iodine therapy (RAI). Although microfocal cancers usually have a low risk of recurrence or death, her case had two factors that would potentially increase her risks. First, the fact that she had multiple foci in both lobes. Second, the fact that one microfocus did invade the  thyroid capsule and might have spread extrathyroidally, although the pathologist did not see evidence of extrathyroidal extension on the slides he reviewed.  I asked the patient to take oral contraceptives from that point until she either decided not to take RAI or until at least one year after receiving RAI. She agreed.  3.  On 06/14/10 I met with Ms. Ticas and her husband again and we had another full and open discussion about the advantages and disadvantages of using RAI. Since the Hennises did not yet have children, and since neither of them wanted to leave a cancer untreated when a potentially curative treatment such as RAI was available, they wanted to proceed with RAI as soon as possible. Since Ms. Peatross had not been on thyroid hormone replacement since surgery, she chose to receive RAI while undergoing thyroid hormone withdrawal and while following a strict low iodine diet. On 06/27/10, Ms. Kilbourne received 50 millicuries of V-748. Her post-RAI scan showed uptake in the thyroid bed, but no spread outside the thyroid bed. As I explained to the Hennises, the I-131 uptake could have been due to the presence of some residual thyroid cells, or to some thyroid cancer cells, or both.   4. The patient has done well clinically since her I-131 therapy. She has had no evidence of recurrence on serial physical exams. Her thyroglobulin (Tg) levels have progressively decreased. On 06/01/10 the post-operative Tg level was 20.8. On 06/14/10 the pre-I-131 Tg level was 2.9. She was hypothyroid for both of these Tg values. On 09/08/10 the post-I-131 Tg level was <0.2. At that point Ms. Cotham was taking Synthroid, 175 mcg/day. Her unstimulated thyroglobulin levels have remained < 0.2 ever  since.   5. Ms Scavone' last clinic visit with me was on 11/09/15.   A. In the interim she has been healthy. She is now pregnant again. Her LMP was 05/20/17. Her EDC is 02/24/17. She had a positive home pregnancy test that was confirmed  at her OB's office last week. She feels well.  B. At last visit I changed her Synthroid dose to 125 mcg/day. In October, after reviewing her lab results, I wanted to change her dose to 125 mcg/day for 5 days each week, but to take 250 mcg/day on two days per week. Unfortunately, she never received that MyChart message, so she has not increased her Synthroid dose.   C. Her allergies have been quiescent. She has not had any vertigo. 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   6. Pertinent Review of Systems: Constitutional. The patient feels "good". She has no significant complaints that pertain to today's visit.  Sleep: The patient usually sleeps well.. Body temperature: The patient's body temperature seems to be normal overall. Weight: Weight has decreased from 199 pounds to 196 pounds.   Eyes: The patient's vision is good. She is not aware of any eye problems. Neck: The patient is not aware of any lumps or masses in her anterior neck and thyroid bed.  There have been no significant other problems with  pressure, discomfort, or difficulty swallowing. Heart: The patient feels the expected increase in heart rate during exercise or other physical activities. There have been no significant problems with palpitations, irregular heart beats, chest pain, or chest pressure. Gastrointestinal: She had some nausea last week. Bowel movements are normal. There are no significant complaints of excessive hunger, upset stomach, stomach aches or pains, diarrhea, or constipation. Musculoskeletal: Muscles and extremities appear to be working normally. There are no significant problems with hand tremor, sweaty palms, palmar erythema, or lower leg swelling. Psychological: Mood is "good".    Mental: The patient's abilities to think, to pay attention, to remember, and to make decisions have normalized since being on Synthroid therapy. GYN: Her LMP was 05/20/16.   PAST MEDICAL, FAMILY, AND SOCIAL  HISTORY: 1. Work and family: She works full-time as a Writer at [employer]. 2. Activities: She is walking and going to the gym.   3. Primary care provider: Dr. Charletta Cousin in [place] 4. OB/GYN: Dr. Princess Bruins, [facility] 5. Surgeon: Dr. Stark Klein, [facility]  3. REVIEW OF SYSTEMS: Ms. Yazdi has no significant problems related to any of her other body systems  PHYSICAL EXAM: BP 106/62   Pulse 78   Ht 5' 7.13" (1.705 m)   Wt 196 lb 12.8 oz (89.3 kg)   BMI 30.71 kg/m  She has lost 3 pounds since her last visit.   Constitutional: She looks healthy and appears physically and emotionally well.  Eyes: There is no arcus or proptosis.  Mouth: The oropharynx appears normal. The tongue appears normal. There is normal oral moisture. There is no obvious gingivitis. She still has her usual mild grade I mustache today.  Neck: There are no bruits present. The thyroid gland is absent. There continues to be some very mild residual induration of the thyroid bed over the strap muscles, more on the left side than on the right. There is no evidence of masses or nodes in the posterior neck, lateral neck, thyroid bed or supraclavicular areas. There is no tenderness to palpation. Lungs: The lungs are clear. Air movement is good. Heart:  The heart rhythm and rate appear normal. Heart sounds S1 and S2 are normal. I do not appreciate any pathologic heart murmurs. Abdomen: The abdomen is enlarged as before. Bowel sounds are normal. The abdomen is soft and non-tender. There is no obviously palpable hepatomegaly, splenomegaly, or other masses.  Arms: Muscle mass appears appropriate for age.  Hands: There is no obvious tremor. Phalangeal and metacarpophalangeal joints appear normal. Palms are normal. Legs: Muscle mass appears appropriate for age. There is no edema.  Neurologic: Muscle strength is normal for age and gender  in both the upper and the lower extremities. Muscle  tone appears normal. Sensation to touch is normal in the legs.   No flowsheet data found.  Labs: 03/15/16: TSH 7.68, free T4 1.6, free T3 3.1  Labs 01/25/16: TSH 8.39, free T4 1.2, free T3 3.0  Labs 10/26/15: TSH 0.96, free T4 1.2, free T3 2.8, thyroglobulin <0.1, anti-thyroglobulin antibody <1   Labs 07/10/15: TSH 0.190, free T4 2.4, free T3 3.4  Labs 05/03/15: TSH 0.162, free T4 1.95, free T3 3.2, thyroglobulin (Tg) < 0.1, anti-Tg antibody < 1  Labs 01/22/15: TSH 0.529, free T4 1.29, free T3 2.8  Labs 12/16/14: TSH 0.811, free T4 1.00, free T3 2.4  Labs 11/05/14: TSH 0.207, free T4 1.17, free T3 2.7  Labs 08/08/13: TSH 2.325, free T4 1.08, free T3 2.9  Labs 05/30/14: Thyroglobulin < 0.1, anti-thyroglobulin antibody <1, TPO antibody 37, TSH 0.209, free T4 1.29, free T3 3.5  Labs 11/19/13: TSH 0.217, free T4 1.73, free T3 3.4, thyroglobulin <0.2, thyroglobulin antibody < 20  Labs 09/18/13: TSH 0.137, free T4 1.70, free T3 3.4  Labs 05/30/13: TSH 1.422, free T4 1.51, free T3 3.1, Thyroglobulin <0.2, thyroglobulin antibody <20  Labs 02/19/13: TSH 1.734, free T4 1.35, free T3 3.2, TPO antibody 40.5, Tg antibody < 20, thyroglobulin < 0.2  Labs 10/01/12: TSH 0.312, free T4 1.66, free T3 3.0, TPO antibody 44.2, thyroglobulin < 0.2, anti-thyroglobulin antibody < 20.  Labs 10/23/11: TSH 0.214, free T4 1.54, free T3 3.2, thyroglobulin < 0.2, anti-thyroglobulin antibody < 20  Labs 08/21/11: TSH 17.234, free T4 1.73, free T3 3.3, Thyrogen-stimulated thyroglobulin (Tg) < 0.2 (undetectable). Anti-thyroglobulin antibody < 20 (normal).  Labs 04/20/11: Thyroglobulin (Tg) < 0.2 (undetectable), anti-thyroglobulin antibody (Tg Ab) < 20   Labs 04/04/11: TSH 0.154, free T4 1.57, free T3 3.1  Labs 08/19/10: Thyroglobulin <0.2. Thyroglobulin antibody <20.  ASSESSMENT: 1. Papillary thyroid cancer:   A. Her exam is normal today. There is still no clinical or lab evidence of recurrence.   B. As noted above,  her Thyrogen-stimulated thyroglobulin (Tg) levels and unstimulated Tg levels have been unmeasurable since March 2012. She has not had any chemical evidence of recurrence.   C. At this point, 5 years after her I-131 therapy, it appears that Ms. Saur' PTC is in remission and is probably cured. She will need continuing follow-up of her PTC annually, but may need adjustment of her Synthroid dose every 6 months.  2. Hypothyroid/hyperthyroid:   A. Her TSH had been suppressed iatrogenically since 2012.   B. Her last lab tests showed that she was hypothyroid. Unfortunately, she did not receive the message to increase her Synthroid dose. We need to check her TFTs today and adjust the dose to keep her TSH in the 1.0-2.0 range.  3. Fatigue: Resolved  4. Bradycardia: Resolved.  5. Obesity: She has continued to be successful at losing weight.   6. Hirsutism: Her fat cell weight  has caused some increase in insulin resistance and a compensatory increase in insulin production. The hyperinsulinemia, in turn, has caused more production of testosterone from her ovaries and androstenedione from her adrenal glands. Reducing her fat cell volume should result in reducing her androgen levels.      PLAN: 1. Diagnostic: I ordered repeat TFTs today. We will repeat her TFTs every two months throughout her pregnancy.  2. Therapeutic: Take Synthroid 125 mcg daily for now, but adjust the Synthroid doses as needed.  3. Patient education: We discussed the issues of gradually increasing her Synthroid doses during the pregnancy to compensate for the increased deiodination of her thyroid hormones by the placenta. As before, once she delivers we will resume her pre-pregnancy dose of Synthroid and then adjust further as needed.  4. Follow-up: Follow-up appointment in 2 months.  Level of Service: This visit lasted in excess of 50 minutes. More than 50% of the visit was devoted to counseling.  Sherrlyn Hock, MD, CDE Adult and  Pediatric Endocrinology

## 2016-06-28 ENCOUNTER — Telehealth (INDEPENDENT_AMBULATORY_CARE_PROVIDER_SITE_OTHER): Payer: Self-pay | Admitting: "Endocrinology

## 2016-06-28 ENCOUNTER — Encounter (INDEPENDENT_AMBULATORY_CARE_PROVIDER_SITE_OTHER): Payer: Self-pay | Admitting: "Endocrinology

## 2016-06-28 NOTE — Telephone Encounter (Signed)
1. I tried to reach Betty Reyes at her home, but no one was available.  2. I left a voicemail message that I have her recent lab results and we'll need to change her doses. I asked her to call me on my cell tomorrow, [phone]. Tillman Sers, MD, CDE

## 2016-06-29 ENCOUNTER — Telehealth (INDEPENDENT_AMBULATORY_CARE_PROVIDER_SITE_OTHER): Payer: Self-pay | Admitting: "Endocrinology

## 2016-06-29 NOTE — Telephone Encounter (Signed)
1. [name] returned my call. I told her that based upon her recent TFT results, we need to increase her Synthroid dose rapidly. 2. I asked her to take her 125 mcg pills as follows:  A. For the next two weeks, take one pill per day on odd-numbered days and 2 pills per day on even-numbered days.  B. After that two-week period, take one pill per day for 5 days each week, but take two pills per day on two days each week. 3. She will repeat her TFTs and have her follow up appointments as planned.  Tillman Sers, MD, CDE

## 2016-06-30 NOTE — Progress Notes (Signed)
See telephone by Dr. Tobe Sos yesterday.

## 2016-08-02 DIAGNOSIS — Z113 Encounter for screening for infections with a predominantly sexual mode of transmission: Secondary | ICD-10-CM | POA: Diagnosis not present

## 2016-08-02 DIAGNOSIS — Z3689 Encounter for other specified antenatal screening: Secondary | ICD-10-CM | POA: Diagnosis not present

## 2016-08-02 DIAGNOSIS — O360110 Maternal care for anti-D [Rh] antibodies, first trimester, not applicable or unspecified: Secondary | ICD-10-CM | POA: Diagnosis not present

## 2016-08-02 DIAGNOSIS — N841 Polyp of cervix uteri: Secondary | ICD-10-CM | POA: Diagnosis not present

## 2016-08-02 DIAGNOSIS — N840 Polyp of corpus uteri: Secondary | ICD-10-CM | POA: Diagnosis not present

## 2016-08-02 DIAGNOSIS — O09529 Supervision of elderly multigravida, unspecified trimester: Secondary | ICD-10-CM | POA: Diagnosis not present

## 2016-08-02 LAB — OB RESULTS CONSOLE ABO/RH: RH Type: NEGATIVE

## 2016-08-02 LAB — OB RESULTS CONSOLE GC/CHLAMYDIA
CHLAMYDIA, DNA PROBE: NEGATIVE
Gonorrhea: NEGATIVE

## 2016-08-02 LAB — OB RESULTS CONSOLE ANTIBODY SCREEN: Antibody Screen: NEGATIVE

## 2016-08-02 LAB — OB RESULTS CONSOLE RUBELLA ANTIBODY, IGM: Rubella: IMMUNE

## 2016-08-02 LAB — OB RESULTS CONSOLE HEPATITIS B SURFACE ANTIGEN: HEP B S AG: NEGATIVE

## 2016-08-02 LAB — OB RESULTS CONSOLE HIV ANTIBODY (ROUTINE TESTING): HIV: NONREACTIVE

## 2016-08-02 LAB — OB RESULTS CONSOLE RPR: RPR: NONREACTIVE

## 2016-08-03 MED FILL — SYNTHROID 125 MCG TABLET: 125 | 90 days supply | Qty: 90 | Fill #3

## 2016-08-22 DIAGNOSIS — Z3687 Encounter for antenatal screening for uncertain dates: Secondary | ICD-10-CM | POA: Diagnosis not present

## 2016-08-25 ENCOUNTER — Ambulatory Visit (INDEPENDENT_AMBULATORY_CARE_PROVIDER_SITE_OTHER): Payer: 59 | Admitting: "Endocrinology

## 2016-08-28 ENCOUNTER — Encounter (INDEPENDENT_AMBULATORY_CARE_PROVIDER_SITE_OTHER): Payer: Self-pay | Admitting: "Endocrinology

## 2016-08-28 ENCOUNTER — Ambulatory Visit (INDEPENDENT_AMBULATORY_CARE_PROVIDER_SITE_OTHER): Payer: 59 | Admitting: "Endocrinology

## 2016-08-28 VITALS — BP 110/70 | HR 68 | Ht 66.81 in | Wt 195.0 lb

## 2016-08-28 DIAGNOSIS — E890 Postprocedural hypothyroidism: Secondary | ICD-10-CM

## 2016-08-28 DIAGNOSIS — E6609 Other obesity due to excess calories: Secondary | ICD-10-CM

## 2016-08-28 DIAGNOSIS — Z3402 Encounter for supervision of normal first pregnancy, second trimester: Secondary | ICD-10-CM

## 2016-08-28 DIAGNOSIS — R5383 Other fatigue: Secondary | ICD-10-CM

## 2016-08-28 DIAGNOSIS — C73 Malignant neoplasm of thyroid gland: Secondary | ICD-10-CM

## 2016-08-28 LAB — T3, FREE: T3, Free: 3.2 pg/mL (ref 2.3–4.2)

## 2016-08-28 LAB — T4, FREE: FREE T4: 1.4 ng/dL (ref 0.8–1.8)

## 2016-08-28 LAB — TSH: TSH: 0.8 mIU/L

## 2016-08-28 NOTE — Patient Instructions (Signed)
Follow up visit in 2 months. Please repeat lab tests one week prior.  

## 2016-08-28 NOTE — Progress Notes (Signed)
CC: FU papillary thyroid cancer, post-operative hypothyroidism, iatrogenic hyperthyroidism, fatigue, bradycardia, overweight/obesity, previous infertility, and new pregnancy.  A. HPI: Betty Reyes is a 41 y.o. Caucasian woman who is accompanied by her husband.  1. Betty Reyes was diagnosed with hypothyroidism, presumably secondary to Hashimoto's Disease, at some time prior to 2010. She was treated with Synthroid, 50 mcg per day. In the Summer of 2010 a goiter was noted.  A thyroid US showed multiple nodules bilaterally, with the largest being 2.6 cm in diameter. Betty Reyes was referred to Dr. Stark Klein, a local general surgeon, for further evaluation and management. Dr. Barry Dienes performed a fine needle aspiration, which reportedly showed Hashimoto's Disease. A follow-up thyroid US in the Summer of 2011 showed that the previously aspirated nodule and two other nodules had enlarged during the year. Dr. Barry Dienes performed a total thyroidectomy on 05/19/10. 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 The largest microfocus was in the superior right lobe, measured 4 mm in diameter, invaded the capsule, but did not extend extrathyroidally.     2. I saw the patient and her husband in consultation on 121/16/11. Clinically she showed no evidence of active thyroid cancer. I reviewed her surgical notes and pathology reports with the patient and her husband. I also discussed with them the latest clinical guidelines on the management of thyroid cancer from the [employer]. We discussed the options for therapy, including whether or not to give radioactive iodine therapy (RAI). Although microfocal cancers usually have a low risk of recurrence or death, her case had two factors that would potentially increase her risks. First, the fact that she had multiple foci in both lobes. Second, the fact that one microfocus did invade the  thyroid capsule and might have spread extrathyroidally, although the pathologist did not see evidence of extrathyroidal extension on the slides he reviewed.  I asked the patient to take oral contraceptives from that point until she either decided not to take RAI or until at least one year after receiving RAI. She agreed.  3.  On 06/14/10 I met with Betty Reyes and her husband again and we had another full and open discussion about the advantages and disadvantages of using RAI. Since the Hennises did not yet have children, and since neither of them wanted to leave a cancer untreated when a potentially curative treatment such as RAI was available, they wanted to proceed with RAI as soon as possible. Since Betty Reyes had not been on thyroid hormone replacement since surgery, she chose to receive RAI while undergoing thyroid hormone withdrawal and while following a strict low iodine diet. On 06/27/10, Betty Reyes received 50 millicuries of H-212. Her post-RAI scan showed uptake in the thyroid bed, but no spread outside the thyroid bed. As I explained to the Hennises, the I-131 uptake could have been due to the presence of some residual thyroid cells, or to some thyroid cancer cells, or both.   4. The patient has done well clinically since her I-131 therapy. She has had no evidence of recurrence on serial physical exams. Her thyroglobulin (Tg) levels have progressively decreased. On 06/01/10 the post-operative Tg level was 20.8. On 06/14/10 the pre-I-131 Tg level was 2.9. She was hypothyroid for both of these Tg values. On 09/08/10 the post-I-131 Tg level was <0.2. At that point Betty Reyes was taking Synthroid, 175 mcg/day. Her unstimulated thyroglobulin levels have remained < 0.2 ever  since.   5. Betty Reyes' last clinic visit with me was on 06/27/16.   A. In the interim she has been healthy. She is now in her third month of pregnancy with a little girl. Her LMP was 05/20/17. Her EDC is 02/24/17. She has felt "rougher"  during this pregnancy, that is, she is more tired, and has more hot flushes, dizziness, and nausea. She does not have vomiting more than once every couple of weeks.   B. After her last visit I changed her Synthroid dose to 125 mcg/day for 5 days each week and 250 mcg/day on two days per week.   C. Her allergies have been quiescent. She had vertigo about one month ago. She did have to use meclizine. She has been diagnosed with Meniere's' disease in the past.   6. Pertinent Review of Systems: Constitutional. The patient feels "okay, but a little more tired than normal". She has no significant complaints that pertain to today's visit.  Sleep: The patient usually sleeps well, except for nocturia 2-3 times per night.  Body temperature: The patient's body temperature seems to be normal overall. Weight: Weight has decreased from 196 pounds to 195 pounds.   Eyes: The patient's vision is good. She is not aware of any eye problems. Neck: The patient is not aware of any lumps or masses in her anterior neck and thyroid bed.  There have been no significant other problems with  pressure, discomfort, or difficulty swallowing. Heart: The patient feels the expected increase in heart rate during exercise or other physical activities. There have been no significant problems with palpitations, irregular heart beats, chest pain, or chest pressure. Gastrointestinal: As above. Bowel movements are normal. There are no other significant complaints of excessive hunger, upset stomach, stomach aches or pains, diarrhea, or constipation. Musculoskeletal: Muscles and extremities appear to be working normally. There are no significant problems with hand tremor, sweaty palms, palmar erythema, or lower leg swelling. Psychological: Mood is "good".    Mental: The patient's abilities to think, to pay attention, to remember, and to make decisions have normalized since being on Synthroid therapy. GYN: Her LMP was 05/20/16.   PAST  MEDICAL, FAMILY, AND SOCIAL HISTORY: 1. Work and family: She works full-time as a Writer at [employer] 2. Activities: She is not walking and going to the gym as often due to her fatigue.   3. Primary care provider: Dr. Charletta Cousin in [place] 4. OB/GYN: Dr. Princess Bruins, [facility] 5. Surgeon: Dr. Stark Klein, [facility]  3. REVIEW OF SYSTEMS: Betty Reyes has no significant problems related to any of her other body systems  PHYSICAL EXAM: BP 110/70   Pulse 68   Ht 5' 6.81" (1.697 m)   Wt 195 lb (88.5 kg)   BMI 30.71 kg/m  She has lost 1 pound since her last visit.   Constitutional: She looks healthy, but tired today.   Eyes: There is no arcus or proptosis.  Mouth: The oropharynx appears normal. The tongue appears normal. There is normal oral moisture. There is no obvious gingivitis. She still has her usual mild grade I mustache today.  Neck: There are no bruits present. The thyroid gland is absent. There continues to be some very mild residual induration of the thyroid bed over the strap muscles, more on the left side than on the right. There is no evidence of masses or nodes in the posterior neck, lateral neck, thyroid bed or supraclavicular areas. There is no  tenderness to palpation. Lungs: The lungs are clear. Air movement is good. Heart: The heart rhythm and rate appear normal. Heart sounds S1 and S2 are normal. I do not appreciate any pathologic heart murmurs. Abdomen: The abdomen is enlarged as before. Bowel sounds are normal. The abdomen is soft and non-tender. There is no obviously palpable hepatomegaly, splenomegaly, or other masses.  Arms: Muscle mass appears appropriate for age.  Hands: There is no obvious tremor. Phalangeal and metacarpophalangeal joints appear normal. Palms are normal. Legs: Muscle mass appears appropriate for age. There is no edema.  Neurologic: Muscle strength is normal for age and gender  in both the upper and the  lower extremities. Muscle tone appears normal. Sensation to touch is normal in the legs.   No flowsheet data found.   Labs 06/27/16: TSH 5.43, free T4 1.3, free T3 3.2  Labs: 03/15/16: TSH 7.68, free T4 1.6, free T3 3.1  Labs 01/25/16: TSH 8.39, free T4 1.2, free T3 3.0  Labs 10/26/15: TSH 0.96, free T4 1.2, free T3 2.8, thyroglobulin <0.1, anti-thyroglobulin antibody <1   Labs 07/10/15: TSH 0.190, free T4 2.4, free T3 3.4  Labs 05/03/15: TSH 0.162, free T4 1.95, free T3 3.2, thyroglobulin (Tg) < 0.1, anti-Tg antibody < 1  Labs 01/22/15: TSH 0.529, free T4 1.29, free T3 2.8  Labs 12/16/14: TSH 0.811, free T4 1.00, free T3 2.4  Labs 11/05/14: TSH 0.207, free T4 1.17, free T3 2.7  Labs 08/08/13: TSH 2.325, free T4 1.08, free T3 2.9  Labs 05/30/14: Thyroglobulin < 0.1, anti-thyroglobulin antibody <1, TPO antibody 37, TSH 0.209, free T4 1.29, free T3 3.5  Labs 11/19/13: TSH 0.217, free T4 1.73, free T3 3.4, thyroglobulin <0.2, thyroglobulin antibody < 20  Labs 09/18/13: TSH 0.137, free T4 1.70, free T3 3.4  Labs 05/30/13: TSH 1.422, free T4 1.51, free T3 3.1, Thyroglobulin <0.2, thyroglobulin antibody <20  Labs 02/19/13: TSH 1.734, free T4 1.35, free T3 3.2, TPO antibody 40.5, Tg antibody < 20, thyroglobulin < 0.2  Labs 10/01/12: TSH 0.312, free T4 1.66, free T3 3.0, TPO antibody 44.2, thyroglobulin < 0.2, anti-thyroglobulin antibody < 20.  Labs 10/23/11: TSH 0.214, free T4 1.54, free T3 3.2, thyroglobulin < 0.2, anti-thyroglobulin antibody < 20  Labs 08/21/11: TSH 17.234, free T4 1.73, free T3 3.3, Thyrogen-stimulated thyroglobulin (Tg) < 0.2 (undetectable). Anti-thyroglobulin antibody < 20 (normal).  Labs 04/20/11: Thyroglobulin (Tg) < 0.2 (undetectable), anti-thyroglobulin antibody (Tg Ab) < 20   Labs 04/04/11: TSH 0.154, free T4 1.57, free T3 3.1  Labs 08/19/10: Thyroglobulin <0.2. Thyroglobulin antibody <20.  ASSESSMENT: 1. Papillary thyroid cancer:   A. Her exam is normal today.  There is still no clinical or lab evidence of recurrence.   B. As noted above, her Thyrogen-stimulated thyroglobulin (Tg) levels and unstimulated Tg levels have been unmeasurable since March 2012. She has not had any chemical evidence of recurrence.   C. Today she does not show any signs of clinical recurrence.   D. At this point, 6 years after her I-131 therapy, it appears that Betty Reyes' PTC is in remission and is probably cured. She will need continuing follow-up of her PTC annually, but may need adjustment of her Synthroid dose every 6 months.  2. Hypothyroid/hyperthyroid:   A. Her TSH had been suppressed iatrogenically since 2012, but has been elevated for the past 7 months.   B. Her last lab tests in January 2018 showed that she was hypothyroid, so I increased her Synthroid dose. We need to  re-check her TFTs today and adjust the dose to keep her TSH in the 1.0-2.0 range.  3. Fatigue: Recurred. It is difficult to determine at this visit whether the fatigue is due to hypothyroidism, to pregnancy, or to a combination of both.  4. Bradycardia: Resolved.  5. Obesity: She has continued to be successful at losing weight.   6. Hirsutism: Her fat cell weight has caused some increase in insulin resistance and a compensatory increase in insulin production. The hyperinsulinemia, in turn, has caused more production of testosterone from her ovaries and androstenedione from her adrenal glands. Reducing her fat cell volume should result in reducing her androgen levels.     7.Pregnancy: She appears to be doing relatively well.   PLAN: 1. Diagnostic: I ordered repeat TFTs today. We will repeat her TFTs every two months throughout her pregnancy.  2. Therapeutic: Take Synthroid 125 mcg daily for 5 days each week, but take 250 mcg/day on two days of the week for now, but adjust the Synthroid doses as needed.  3. Patient education: We discussed the issues of gradually increasing her Synthroid doses during the  pregnancy to compensate for the increased deiodination of her thyroid hormones by the placenta. As before, once she delivers we will resume her pre-pregnancy dose of Synthroid and then adjust further as needed.  4. Follow-up: Follow-up appointment in 2 months.  Level of Service: This visit lasted in excess of 50 minutes. More than 50% of the visit was devoted to counseling.  Sherrlyn Hock, MD, CDE Adult and Pediatric Endocrinology

## 2016-09-05 DIAGNOSIS — Z361 Encounter for antenatal screening for raised alphafetoprotein level: Secondary | ICD-10-CM | POA: Diagnosis not present

## 2016-10-04 DIAGNOSIS — O09522 Supervision of elderly multigravida, second trimester: Secondary | ICD-10-CM | POA: Diagnosis not present

## 2016-10-04 DIAGNOSIS — Z3A19 19 weeks gestation of pregnancy: Secondary | ICD-10-CM | POA: Diagnosis not present

## 2016-10-17 ENCOUNTER — Telehealth (INDEPENDENT_AMBULATORY_CARE_PROVIDER_SITE_OTHER): Payer: Self-pay | Admitting: "Endocrinology

## 2016-10-17 NOTE — Telephone Encounter (Signed)
  Who's calling (name and relationship to patient) : Betty Reyes, self  Best contact number: [phone]  Provider they see: Tobe Sos  Reason for call: Avilyn called in stating she has an appt with her OB/GYN on the same day that she was going to have her blood work done for Dr. Tobe Sos.  She stated she also has to get labs drawn at the OB/GYN.  [name] would like to know if she can get all the labs done at her OB/GYN and have the results sent to you instead of being stuck twice in one day.  Please call Betty Reyes back at [phone].     PRESCRIPTION REFILL ONLY  Name of prescription:  Pharmacy:

## 2016-10-18 NOTE — Telephone Encounter (Signed)
LVM, Advised doing labs together is fine.

## 2016-10-23 ENCOUNTER — Other Ambulatory Visit: Payer: Self-pay | Admitting: "Endocrinology

## 2016-10-23 DIAGNOSIS — C73 Malignant neoplasm of thyroid gland: Secondary | ICD-10-CM

## 2016-10-23 DIAGNOSIS — E890 Postprocedural hypothyroidism: Secondary | ICD-10-CM

## 2016-10-23 DIAGNOSIS — E0580 Other thyrotoxicosis without thyrotoxic crisis or storm: Secondary | ICD-10-CM

## 2016-10-23 MED FILL — SYNTHROID 125 MCG TABLET: 125 | 90 days supply | Qty: 90 | Fill #0

## 2016-10-30 DIAGNOSIS — Z3A23 23 weeks gestation of pregnancy: Secondary | ICD-10-CM | POA: Diagnosis not present

## 2016-10-30 DIAGNOSIS — E039 Hypothyroidism, unspecified: Secondary | ICD-10-CM | POA: Diagnosis not present

## 2016-10-30 DIAGNOSIS — O99282 Endocrine, nutritional and metabolic diseases complicating pregnancy, second trimester: Secondary | ICD-10-CM | POA: Diagnosis not present

## 2016-11-08 ENCOUNTER — Ambulatory Visit (INDEPENDENT_AMBULATORY_CARE_PROVIDER_SITE_OTHER): Payer: Self-pay | Admitting: "Endocrinology

## 2016-11-13 ENCOUNTER — Encounter (INDEPENDENT_AMBULATORY_CARE_PROVIDER_SITE_OTHER): Payer: Self-pay | Admitting: "Endocrinology

## 2016-11-13 ENCOUNTER — Ambulatory Visit (INDEPENDENT_AMBULATORY_CARE_PROVIDER_SITE_OTHER): Payer: 59 | Admitting: "Endocrinology

## 2016-11-13 VITALS — BP 118/60 | HR 84 | Wt 199.8 lb

## 2016-11-13 DIAGNOSIS — L680 Hirsutism: Secondary | ICD-10-CM

## 2016-11-13 DIAGNOSIS — Z3482 Encounter for supervision of other normal pregnancy, second trimester: Secondary | ICD-10-CM | POA: Diagnosis not present

## 2016-11-13 DIAGNOSIS — E890 Postprocedural hypothyroidism: Secondary | ICD-10-CM | POA: Diagnosis not present

## 2016-11-13 DIAGNOSIS — C73 Malignant neoplasm of thyroid gland: Secondary | ICD-10-CM

## 2016-11-13 DIAGNOSIS — E6609 Other obesity due to excess calories: Secondary | ICD-10-CM | POA: Diagnosis not present

## 2016-11-13 DIAGNOSIS — R5383 Other fatigue: Secondary | ICD-10-CM | POA: Diagnosis not present

## 2016-11-13 NOTE — Progress Notes (Signed)
CC: FU papillary thyroid cancer, post-operative hypothyroidism, iatrogenic hyperthyroidism, fatigue, bradycardia, overweight/obesity, previous infertility, and new pregnancy.  A. HPI: Betty Reyes is a 41 y.o. Caucasian woman who is unaccompanied today.  1. Betty Reyes was diagnosed with hypothyroidism, presumably secondary to Hashimoto's Disease, at some time prior to 2010. She was treated with Synthroid, 50 mcg per day. In the Summer of 2010 a goiter was noted.  A thyroid US showed multiple nodules bilaterally, with the largest being 2.6 cm in diameter. Betty Reyes was referred to Dr. Stark Klein, a local general surgeon, for further evaluation and management. Dr. Barry Dienes performed a fine needle aspiration, which reportedly showed Hashimoto's Disease. A follow-up thyroid US in the Summer of 2011 showed that the previously aspirated nodule and two other nodules had enlarged during the year. Dr. Barry Dienes performed a total thyroidectomy on 05/19/10. 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 The largest microfocus was in the superior right lobe, measured 4 mm in diameter, invaded the capsule, but did not extend extrathyroidally.     2. I saw the patient and her husband in consultation on 121/16/11. Clinically she showed no evidence of active thyroid cancer. I reviewed her surgical notes and pathology reports with the patient and her husband. I also discussed with them the latest clinical guidelines on the management of thyroid cancer from the [employer]. We discussed the options for therapy, including whether or not to give radioactive iodine therapy (RAI). Although microfocal cancers usually have a low risk of recurrence or death, her case had two factors that would potentially increase her risks. First, the fact that she had multiple foci in both lobes. Second, the fact that one microfocus did invade the thyroid  capsule and might have spread extrathyroidally, although the pathologist did not see evidence of extrathyroidal extension on the slides he reviewed.  I asked the patient to take oral contraceptives from that point until she either decided not to take RAI or until at least one year after receiving RAI. She agreed.  3.  On 06/14/10 I met with Betty Reyes and her husband again and we had another full and open discussion about the advantages and disadvantages of using RAI. Since the Hennises did not yet have children, and since neither of them wanted to leave a cancer untreated when a potentially curative treatment such as RAI was available, they wanted to proceed with RAI as soon as possible. Since Betty Reyes had not been on thyroid hormone replacement since surgery, she chose to receive RAI while undergoing thyroid hormone withdrawal and while following a strict low iodine diet. On 06/27/10, Betty Reyes received 50 millicuries of I-712. Her post-RAI scan showed uptake in the thyroid bed, but no spread outside the thyroid bed. As I explained to the Hennises, the I-131 uptake could have been due to the presence of some residual thyroid cells, or to some thyroid cancer cells, or both.   4. The patient has done well clinically since her I-131 therapy. She has had no evidence of recurrence on serial physical exams. Her thyroglobulin (Tg) levels have progressively decreased. On 06/01/10 the post-operative Tg level was 20.8. On 06/14/10 the pre-I-131 Tg level was 2.9. She was hypothyroid for both of these Tg values. On 09/08/10 the post-I-131 Tg level was <0.2. At that point Betty Reyes was taking Synthroid, 175 mcg/day. Her unstimulated thyroglobulin levels have remained < 0.2 ever since.  5. Betty Lucy' last clinic visit with me was on 08/28/16.   A. In the interim she has been healthy. She is now in her fifth month of pregnancy with a little girl. Her LMP was 05/20/17. Her EDC is 02/24/17. She still feels "rougher"  during this pregnancy, that is, she is more tired, and has had more hot flushes, dizziness, and nausea. She still has vomiting about once per week.   B. After her last visit I changed her Synthroid dose to 125 mcg/day for 5 days each week and 250 mcg/day on two days per week.   C. Her allergies have been quiescent. She had vertigo about one month ago. She has had to take  Meclizine occasionally. She has been diagnosed with Meniere's' disease in the past.   6. Pertinent Review of Systems: Constitutional. The patient feels "good overall, considering that I am 5 months pregnant". She has no significant complaints that pertain to today's visit.  Sleep: The patient usually sleeps well, except for nocturia 2-3 times per night or more.  Body temperature: The patient's body temperature seems to be normal overall. Weight: Weight has increased from 195 pounds to 199 pounds.   Eyes: The patient's vision is good. She is not aware of any eye problems. Neck: The patient is not aware of any lumps or masses in her anterior neck and thyroid bed.  There have been no significant other problems with  pressure, discomfort, or difficulty swallowing. Heart: The patient feels the expected increase in heart rate during exercise or other physical activities. There have been no significant problems with palpitations, irregular heart beats, chest pain, or chest pressure. Gastrointestinal: As above. Bowel movements are normal. There are no other significant complaints of excessive hunger, upset stomach, stomach aches or pains, diarrhea, or constipation. Musculoskeletal: Muscles and extremities appear to be working normally. There are no significant problems with hand tremor, sweaty palms, palmar erythema, or lower leg swelling. Psychological: Mood is "fine".    Mental: The patient's abilities to think, to pay attention, to remember, and to make decisions have normalized since being on Synthroid therapy. GYN: Her LMP was  05/20/16.   PAST MEDICAL, FAMILY, AND SOCIAL HISTORY: 1. Work and family: She works full-time as a Writer at [employer]. 2. Activities: She is walking more with her husband.    3. Primary care provider: Dr. Charletta Cousin in [place] 4. OB/GYN: Dr. Aloha Gell, [facility] 5. Surgeon: Dr. Stark Klein, [facility]  3. REVIEW OF SYSTEMS: Betty Reyes has no significant problems related to any of her other body systems  PHYSICAL EXAM: BP 118/60   Pulse 84   Wt 199 lb 12.8 oz (90.6 kg)   BMI 31.47 kg/m  She has gained 4 pounds since her last visit.   Constitutional: She looks healthy, but somewhat tired today.   Eyes: There is no arcus or proptosis.  Mouth: The oropharynx appears normal. The tongue appears normal. There is normal oral moisture. There is no obvious gingivitis. She has her usual mild grade I mustache today.  Neck: There are no bruits present. The thyroid gland is absent. There continues to be some very mild residual induration of the thyroid bed over the strap muscles, more on the left side than on the right. There is no evidence of masses or nodes in the posterior neck, lateral neck, thyroid bed or supraclavicular areas. There is no tenderness to palpation. Lungs: The lungs are clear. Air movement is good. Heart:  The heart rhythm and rate appear normal. Heart sounds S1 and S2 are normal. I do not appreciate any pathologic heart murmurs. Abdomen: The abdomen is enlarged as before. Bowel sounds are normal. The abdomen is soft and non-tender. There is no obviously palpable hepatomegaly, splenomegaly, or other masses.  Arms: Muscle mass appears appropriate for age.  Hands: There is no obvious tremor. Phalangeal and metacarpophalangeal joints appear normal. Palms are normal. Legs: Muscle mass appears appropriate for age. There is no edema.  Neurologic: Muscle strength is normal for age and gender  in both the upper and the lower extremities. Muscle  tone appears normal. Sensation to touch is normal in the legs.   No flowsheet data found.   Labs 10/30/16: TSH 0.70, T4 7.6 (ref 4.5-12), T3 142 (ref 71-180); TSI <0.1 IU/L  Labs 06/27/16: TSH 5.43, free T4 1.3, free T3 3.2  Labs: 03/15/16: TSH 7.68, free T4 1.6, free T3 3.1  Labs 01/25/16: TSH 8.39, free T4 1.2, free T3 3.0  Labs 10/26/15: TSH 0.96, free T4 1.2, free T3 2.8, thyroglobulin <0.1, anti-thyroglobulin antibody <1   Labs 07/10/15: TSH 0.190, free T4 2.4, free T3 3.4  Labs 05/03/15: TSH 0.162, free T4 1.95, free T3 3.2, thyroglobulin (Tg) < 0.1, anti-Tg antibody < 1  Labs 01/22/15: TSH 0.529, free T4 1.29, free T3 2.8  Labs 12/16/14: TSH 0.811, free T4 1.00, free T3 2.4  Labs 11/05/14: TSH 0.207, free T4 1.17, free T3 2.7  Labs 08/08/13: TSH 2.325, free T4 1.08, free T3 2.9  Labs 05/30/14: Thyroglobulin < 0.1, anti-thyroglobulin antibody <1, TPO antibody 37, TSH 0.209, free T4 1.29, free T3 3.5  Labs 11/19/13: TSH 0.217, free T4 1.73, free T3 3.4, thyroglobulin <0.2, thyroglobulin antibody < 20  Labs 09/18/13: TSH 0.137, free T4 1.70, free T3 3.4  Labs 05/30/13: TSH 1.422, free T4 1.51, free T3 3.1, Thyroglobulin <0.2, thyroglobulin antibody <20  Labs 02/19/13: TSH 1.734, free T4 1.35, free T3 3.2, TPO antibody 40.5, Tg antibody < 20, thyroglobulin < 0.2  Labs 10/01/12: TSH 0.312, free T4 1.66, free T3 3.0, TPO antibody 44.2, thyroglobulin < 0.2, anti-thyroglobulin antibody < 20.  Labs 10/23/11: TSH 0.214, free T4 1.54, free T3 3.2, thyroglobulin < 0.2, anti-thyroglobulin antibody < 20  Labs 08/21/11: TSH 17.234, free T4 1.73, free T3 3.3, Thyrogen-stimulated thyroglobulin (Tg) < 0.2 (undetectable). Anti-thyroglobulin antibody < 20 (normal).  Labs 04/20/11: Thyroglobulin (Tg) < 0.2 (undetectable), anti-thyroglobulin antibody (Tg Ab) < 20   Labs 04/04/11: TSH 0.154, free T4 1.57, free T3 3.1  Labs 08/19/10: Thyroglobulin <0.2. Thyroglobulin antibody <20.  ASSESSMENT: 1.  Papillary thyroid cancer:   A. Her exam is normal today. There is still no clinical or lab evidence of recurrence.   B. As noted above, her Thyrogen-stimulated thyroglobulin (Tg) levels and unstimulated Tg levels have been unmeasurable since March 2012. She has not had any chemical evidence of recurrence.   C. Today she does not show any signs of clinical recurrence.   D. At this point, 6 years after her I-131 therapy, it appears that Betty Reyes' PTC is in remission and is probably cured. She will need continuing follow-up of her PTC annually, but may need adjustment of her Synthroid dose every 6 months. We will repeat her Tg panel today.   2. Hypothyroid/hyperthyroid:   A. Her TSH had been suppressed iatrogenically since 2012, but was elevated from August 2017 to January 2018.     B. Her lab tests in January 2018 showed that  she was still hypothyroid, so I increased her Synthroid dose. Her labs in May 2018 showed that she was euthyroid, at the upper end of the normal range, which is quite good for her now since she will be having more placental metabolism of her T4 in the next two months.  3. Fatigue: Her fatigue is due to a combination of pregnancy and nocturia.  4. Bradycardia: Resolved.  5. Obesity: She has resumed gaining weight.    6. Hirsutism: Her fat cell weight has caused some increase in insulin resistance and a compensatory increase in insulin production. The hyperinsulinemia, in turn, has caused more production of testosterone from her ovaries and androstenedione from her adrenal glands. Reducing her fat cell volume should result in reducing her androgen levels.     7. Pregnancy: She appears to be doing relatively well.   PLAN: 1. Diagnostic: I ordered repeat Tg panel today. We will repeat her TFTs every two months throughout her pregnancy.  2. Therapeutic: Take Synthroid 125 mcg daily for 5 days each week, but take 250 mcg/day on two days of the week for now. Adjust the Synthroid doses  as needed.  3. Patient education: We discussed the issues of gradually increasing her Synthroid doses during the pregnancy to compensate for the increased deiodination of her thyroid hormones by the placenta. As before, once she delivers we will resume her pre-pregnancy dose of Synthroid and then adjust further as needed.  4. Follow-up: Follow-up appointment in 2 months.  Level of Service: This visit lasted in excess of 50 minutes. More than 50% of the visit was devoted to counseling.  Sherrlyn Hock, MD, CDE Adult and Pediatric Endocrinology

## 2016-11-13 NOTE — Patient Instructions (Signed)
Follow up visit in 2 months. Please repeat thyroid tests one week prior.

## 2016-11-14 ENCOUNTER — Encounter (INDEPENDENT_AMBULATORY_CARE_PROVIDER_SITE_OTHER): Payer: Self-pay

## 2016-11-14 LAB — THYROGLOBULIN ANTIBODY PANEL
THYROID PEROXIDASE ANTIBODY: 14 [IU]/mL — AB (ref <9)
Thyroglobulin Ab: <1 IU/mL (ref <2)

## 2016-12-01 DIAGNOSIS — O99282 Endocrine, nutritional and metabolic diseases complicating pregnancy, second trimester: Secondary | ICD-10-CM | POA: Diagnosis not present

## 2016-12-01 DIAGNOSIS — Z23 Encounter for immunization: Secondary | ICD-10-CM | POA: Diagnosis not present

## 2016-12-01 DIAGNOSIS — Z3689 Encounter for other specified antenatal screening: Secondary | ICD-10-CM | POA: Diagnosis not present

## 2016-12-01 DIAGNOSIS — Z3A27 27 weeks gestation of pregnancy: Secondary | ICD-10-CM | POA: Diagnosis not present

## 2016-12-01 DIAGNOSIS — O360120 Maternal care for anti-D [Rh] antibodies, second trimester, not applicable or unspecified: Secondary | ICD-10-CM | POA: Diagnosis not present

## 2016-12-15 DIAGNOSIS — Z3689 Encounter for other specified antenatal screening: Secondary | ICD-10-CM | POA: Diagnosis not present

## 2017-01-03 MED FILL — SYNTHROID 125 MCG TABLET: 125 | 90 days supply | Qty: 90 | Fill #1

## 2017-01-09 DIAGNOSIS — E890 Postprocedural hypothyroidism: Secondary | ICD-10-CM | POA: Diagnosis not present

## 2017-01-10 LAB — T3, FREE: T3 FREE: 3 pg/mL (ref 2.3–4.2)

## 2017-01-10 LAB — T4, FREE: FREE T4: 1.4 ng/dL (ref 0.8–1.8)

## 2017-01-10 LAB — TSH: TSH: 0.22 mIU/L — ABNORMAL LOW

## 2017-01-14 ENCOUNTER — Telehealth (INDEPENDENT_AMBULATORY_CARE_PROVIDER_SITE_OTHER): Payer: Self-pay | Admitting: "Endocrinology

## 2017-01-14 NOTE — Telephone Encounter (Signed)
1. I called the patient to discuss her recent TFT results with her.  2.

## 2017-01-15 ENCOUNTER — Encounter (INDEPENDENT_AMBULATORY_CARE_PROVIDER_SITE_OTHER): Payer: Self-pay | Admitting: "Endocrinology

## 2017-01-15 ENCOUNTER — Ambulatory Visit (INDEPENDENT_AMBULATORY_CARE_PROVIDER_SITE_OTHER): Payer: 59 | Admitting: "Endocrinology

## 2017-01-15 VITALS — BP 112/60 | HR 78 | Wt 201.0 lb

## 2017-01-15 DIAGNOSIS — O2693 Pregnancy related conditions, unspecified, third trimester: Secondary | ICD-10-CM | POA: Diagnosis not present

## 2017-01-15 DIAGNOSIS — E890 Postprocedural hypothyroidism: Secondary | ICD-10-CM | POA: Diagnosis not present

## 2017-01-15 DIAGNOSIS — C73 Malignant neoplasm of thyroid gland: Secondary | ICD-10-CM

## 2017-01-15 DIAGNOSIS — E6609 Other obesity due to excess calories: Secondary | ICD-10-CM

## 2017-01-15 DIAGNOSIS — R5383 Other fatigue: Secondary | ICD-10-CM | POA: Diagnosis not present

## 2017-01-15 NOTE — Progress Notes (Signed)
CC: FU papillary thyroid cancer, post-operative hypothyroidism, iatrogenic hyperthyroidism, fatigue, bradycardia, overweight/obesity, previous infertility, and new pregnancy.  A. HPI: Betty Reyes is a 41 y.o. Caucasian woman who is unaccompanied today.  1. Betty Reyes was diagnosed with hypothyroidism, presumably secondary to Hashimoto's Disease, at some time prior to 2010. She was treated with Synthroid, 50 mcg per day. In the Summer of 2010 a goiter was noted.  A thyroid US showed multiple nodules bilaterally, with the largest being 2.6 cm in diameter. Betty Reyes was referred to Dr. Stark Klein, a local general surgeon, for further evaluation and management. Dr. Barry Dienes performed a fine needle aspiration, which reportedly showed Hashimoto's Disease. A follow-up thyroid US in the Summer of 2011 showed that the previously aspirated nodule and two other nodules had enlarged during the year. Dr. Barry Dienes performed a total thyroidectomy on 05/19/10. 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 The largest microfocus was in the superior right lobe, measured 4 mm in diameter, invaded the capsule, but did not extend extrathyroidally.     2. I saw the patient and her husband in consultation on 121/16/11. Clinically she showed no evidence of active thyroid cancer. I reviewed her surgical notes and pathology reports with the patient and her husband. I also discussed with them the latest clinical guidelines on the management of thyroid cancer from the American Thy[employer] Association. We discussed the options for therapy, including whether or not to give radioactive iodine therapy (RAI). Although microfocal cancers usually have a low risk of recurrence or death, her case had two factors that would potentially increase her risks. First, the fact that she had multiple foci in both lobes. Second, the fact that one microfocus did invade the thyroid  capsule and might have spread extrathyroidally, although the pathologist did not see evidence of extrathyroidal extension on the slides he reviewed.  I asked the patient to take oral contraceptives from that point until she either decided not to take RAI or until at least one year after receiving RAI. She agreed.  3.  On 06/14/10 I met with Betty Reyes and her husband again and we had another full and open discussion about the advantages and disadvantages of using RAI. Since the Hennises did not yet have children, and since neither of them wanted to leave a cancer untreated when a potentially curative treatment such as RAI was available, they wanted to proceed with RAI as soon as possible. Since Betty Reyes had not been on thyroid hormone replacement since surgery, she chose to receive RAI while undergoing thyroid hormone withdrawal and while following a strict low iodine diet. On 06/27/10, Betty Reyes received 50 millicuries of S-341. Her post-RAI scan showed uptake in the thyroid bed, but no spread outside the thyroid bed. As I explained to the Hennises, the I-131 uptake could have been due to the presence of some residual thyroid cells, or to some thyroid cancer cells, or both.   4. The patient has done well clinically since her I-131 therapy. She has had no evidence of recurrence on serial physical exams. Her thyroglobulin (Tg) levels have progressively decreased. On 06/01/10 the post-operative Tg level was 20.8. On 06/14/10 the pre-I-131 Tg level was 2.9. She was hypothyroid for both of these Tg values. On 09/08/10 the post-I-131 Tg level was <0.2. At that point Ms. Forrey was taking Synthroid, 175 mcg/day. Her unstimulated thyroglobulin levels have remained < 0.2 ever since.  5. Ms Reyes' last clinic visit with me was on 11/13/16.   A. In the interim she has been healthy. She is now in her eighth month of pregnancy with a little girl. Her LMP was 05/20/17. Her EDC is 02/24/17. She still feels "more tired"  during this pregnancy. Fortunately, her hot flushes, dizziness, nausea, and vomiting have resolved. 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   B. After her  January 2018 visit I changed her Synthroid dosage to 125 mcg/day for 5 days each week and 250 mcg/day on two days per week. She has remained at that dosage ever since.  C. Her allergies have been quiescent. She had not had any vertigo recently. She has not had to take meclizine. She has been diagnosed with Meniere's' disease in the past.   6. Pertinent Review of Systems: Constitutional. The patient feels "pretty good overall". She has no significant complaints that pertain to today's visit.  Sleep: The patient ability to sleep has deteriorated as the pregnancy has progressed.  Body temperature: The patient's body temperature seems to be normal overall. Weight: Weight has increased from 199 to 201 pounds.   Eyes: The patient's vision is good. She is not aware of any eye problems. Neck: The patient is not aware of any lumps or masses in her anterior neck and thyroid bed.  There have been no significant other problems with  pressure, discomfort, or difficulty swallowing. Heart: The patient feels the expected increase in heart rate during exercise or other physical activities. There have been no significant problems with palpitations, irregular heart beats, chest pain, or chest pressure. Gastrointestinal: As above. Bowel movements are normal. There are no other significant complaints of excessive hunger, upset stomach, stomach aches or pains, diarrhea, or constipation. Musculoskeletal: Muscles and extremities appear to be working normally. There are no significant problems with hand tremor, sweaty palms, palmar erythema, or lower leg swelling. Psychological: Mood is "fine  for a pregnant woman".    Mental: The patient's abilities to think, to pay attention, to remember, and to make decisions have normalized since being on Synthroid therapy. GYN: Her LMP was 05/20/16.   PAST MEDICAL, FAMILY, AND SOCIAL HISTORY: 1. Work and family: She works full-time as a Writer at [employer]. 2. Activities: She is walking more with her husband.    3. Primary care provider: Dr. Charletta Cousin in [place] 4. OB/GYN: Dr. Aloha Gell, [facility], [phone] 5. Surgeon: Dr. Stark Klein, [facility]  3. REVIEW OF SYSTEMS: Ms. Kinnison has no significant problems related to any of her other body systems  PHYSICAL EXAM: BP 112/60   Pulse 78   Wt 201 lb (91.2 kg)   BMI 31.66 kg/m  She has gained 2 pounds since her last visit.   Constitutional: She looks healthy, but somewhat tired today.   Eyes: There is no arcus or proptosis.  Mouth: The oropharynx appears normal. The tongue appears normal. There is normal oral moisture. There is no obvious gingivitis. She has her usual mild grade I blond vellus mustache today.  Neck: There are no bruits present. The thyroid gland is absent. There continues to be some very mild residual induration of the thyroid bed over the strap muscles, more on the left side than on the right. There  is no evidence of masses or nodes in the posterior neck, lateral neck, thyroid bed or supraclavicular areas. There is no tenderness to palpation. Lungs: The lungs are clear. Air movement is good. Heart: The heart rhythm and rate appear normal. Heart sounds S1 and S2 are normal. I do not appreciate any pathologic heart murmurs. Abdomen: The abdomen is gravid and larger, c/w the 8th month of pregnancy. Bowel sounds are normal. The abdomen is soft and non-tender. There is no obviously palpable hepatomegaly, splenomegaly, or other masses.  Arms: Muscle mass appears appropriate for age.  Hands: There is no obvious tremor. Phalangeal  and metacarpophalangeal joints appear normal. Palms are normal. Legs: Muscle mass appears appropriate for age. There is no edema.  Neurologic: Muscle strength is normal for age and gender  in both the upper and the lower extremities. Muscle tone appears normal. Sensation to touch is normal in the legs.   No flowsheet data found.   Labs 01/09/17: TSH 0.22, free T4 1.4, free T3 3.0,thyroglobulin <0.1, anti-thyrogloblulin antibody <1  Labs 10/30/16: TSH 0.70, T4 7.6 (ref 4.5-12), T3 142 (ref 71-180); TSI <0.1 IU/L  Labs 06/27/16: TSH 5.43, free T4 1.3, free T3 3.2  Labs: 03/15/16: TSH 7.68, free T4 1.6, free T3 3.1  Labs 01/25/16: TSH 8.39, free T4 1.2, free T3 3.0  Labs 10/26/15: TSH 0.96, free T4 1.2, free T3 2.8, thyroglobulin <0.1, anti-thyroglobulin antibody <1   Labs 07/10/15: TSH 0.190, free T4 2.4, free T3 3.4  Labs 05/03/15: TSH 0.162, free T4 1.95, free T3 3.2, thyroglobulin (Tg) < 0.1, anti-Tg antibody < 1  Labs 01/22/15: TSH 0.529, free T4 1.29, free T3 2.8  Labs 12/16/14: TSH 0.811, free T4 1.00, free T3 2.4  Labs 11/05/14: TSH 0.207, free T4 1.17, free T3 2.7  Labs 08/08/13: TSH 2.325, free T4 1.08, free T3 2.9  Labs 05/30/14: Thyroglobulin < 0.1, anti-thyroglobulin antibody <1, TPO antibody 37, TSH 0.209, free T4 1.29, free T3 3.5  Labs 11/19/13: TSH 0.217, free T4 1.73, free T3 3.4, thyroglobulin <0.2, thyroglobulin antibody < 20  Labs 09/18/13: TSH 0.137, free T4 1.70, free T3 3.4  Labs 05/30/13: TSH 1.422, free T4 1.51, free T3 3.1, Thyroglobulin <0.2, thyroglobulin antibody <20  Labs 02/19/13: TSH 1.734, free T4 1.35, free T3 3.2, TPO antibody 40.5, Tg antibody < 20, thyroglobulin < 0.2  Labs 10/01/12: TSH 0.312, free T4 1.66, free T3 3.0, TPO antibody 44.2, thyroglobulin < 0.2, anti-thyroglobulin antibody < 20.  Labs 10/23/11: TSH 0.214, free T4 1.54, free T3 3.2, thyroglobulin < 0.2, anti-thyroglobulin antibody < 20  Labs 08/21/11: TSH 17.234, free T4 1.73, free T3 3.3,  Thyrogen-stimulated thyroglobulin (Tg) < 0.2 (undetectable). Anti-thyroglobulin antibody < 20 (normal).  Labs 04/20/11: Thyroglobulin (Tg) < 0.2 (undetectable), anti-thyroglobulin antibody (Tg Ab) < 20   Labs 04/04/11: TSH 0.154, free T4 1.57, free T3 3.1  Labs 08/19/10: Thyroglobulin <0.2. Thyroglobulin antibody <20.  ASSESSMENT: 1. Papillary thyroid cancer:   A. Her exam is normal today. There is still no clinical or lab evidence of recurrence.   B. As noted above, her Thyrogen-stimulated thyroglobulin (Tg) levels and unstimulated Tg levels have been unmeasurable since March 2012. She has not had any chemical evidence of recurrence.   C. Today she still does not show any signs of clinical recurrence. Her Tg panel on 01/09/17 also shows no signs of recurrence.   D. At this point, 6 years after her I-131 therapy, it appears that Ms. Gumina' PTC is in remission and  is probably cured. She will need continuing follow-up of her PTC annually, but may need adjustment of her Synthroid dose every 6 months.  2. Hypothyroid/hyperthyroid:   A. Her TSH had been suppressed iatrogenically since 2012, but was elevated from August 2017 to January 2018.     B. Her lab tests in January 2018 showed that she was still hypothyroid, so I increased her Synthroid dose. Her labs in May 2018 showed that she was euthyroid, at the upper end of the normal range, which was quite good for her since I anticipated that she would be having more placental metabolism of her T4 in the next two months.   C. However, her TSH is now suppressed and her free T4 is a bit higher. She needs a lower dose of Synthroid. The placental did not deiodinate her thyroxine levels as much as I had anticipated.   D. The fact that her placenta did not deiodinate her thyroxine level as much as I had anticipated may not represent any placental pathology. However, it is reasonable to watch her for any signs of placental insufficiency. Please report any  decreased fetal movements to Dr. Pamala Hurry.  3. Fatigue: Her fatigue is due to a combination of pregnancy and nocturia.  4. Bradycardia: Resolved.  5. Obesity: She has resumed gaining weight.    6. Hirsutism: Her fat cell weight has caused some increase in insulin resistance and a compensatory increase in insulin production. The hyperinsulinemia, in turn, has caused more production of testosterone from her ovaries and androstenedione from her adrenal glands. Reducing her fat cell volume should result in reducing her androgen levels.     7. Pregnancy related condition, 3rd trimester: She appears to be doing relatively well.    PLAN: 1. Diagnostic: I reviewed her recent TFTs and Tg panel today. We will repeat her TFTs four weeks after delivery.  2. Therapeutic: Reduce the Synthroid dosage to 125 mcg daily for 7 days each week. Adjust the Synthroid doses in the future as needed.  3. Patient education: We discussed the issue of gradually decreasing her Synthroid doses during the remainder of the pregnancy. As before, once she delivers we will continue her new Synthroid dose regimen until we see the results of her postpartum TFTs.  4. Follow-up: Follow-up appointment in 2 months.  Level of Service: This visit lasted in excess of 50 minutes. More than 50% of the visit was devoted to counseling.  Sherrlyn Hock, MD, CDE Adult and Pediatric Endocrinology

## 2017-01-15 NOTE — Patient Instructions (Addendum)
Follow up visit in 4 months. Please reduce Synthroid dose now to 125 mcg/day every day. Please repeat thyroid blood tests 4 weeks after delivery.

## 2017-01-24 DIAGNOSIS — O09523 Supervision of elderly multigravida, third trimester: Secondary | ICD-10-CM | POA: Diagnosis not present

## 2017-01-24 DIAGNOSIS — Z3A35 35 weeks gestation of pregnancy: Secondary | ICD-10-CM | POA: Diagnosis not present

## 2017-01-24 DIAGNOSIS — O43193 Other malformation of placenta, third trimester: Secondary | ICD-10-CM | POA: Diagnosis not present

## 2017-01-24 DIAGNOSIS — Z3685 Encounter for antenatal screening for Streptococcus B: Secondary | ICD-10-CM | POA: Diagnosis not present

## 2017-01-24 LAB — OB RESULTS CONSOLE GBS: GBS: NEGATIVE

## 2017-02-06 ENCOUNTER — Telehealth (INDEPENDENT_AMBULATORY_CARE_PROVIDER_SITE_OTHER): Payer: Self-pay | Admitting: "Endocrinology

## 2017-02-06 NOTE — Telephone Encounter (Signed)
°  Who's calling (name and relationship to patient) : Madylyn for [place] contact number: [phone] Provider they see: Tobe Sos  Reason for call: Patient stated a release was sent to the office several times to the office.  The last was sent via [place]. Please call when the release of records has been sent.     PRESCRIPTION REFILL ONLY  Name of prescription:  Pharmacy:

## 2017-02-07 NOTE — Telephone Encounter (Signed)
Betty Reyes spoke to a rep from [facility] and advised him to refax the request.

## 2017-02-22 DIAGNOSIS — O99283 Endocrine, nutritional and metabolic diseases complicating pregnancy, third trimester: Secondary | ICD-10-CM | POA: Diagnosis not present

## 2017-02-22 DIAGNOSIS — Z3A39 39 weeks gestation of pregnancy: Secondary | ICD-10-CM | POA: Diagnosis not present

## 2017-02-23 ENCOUNTER — Telehealth (HOSPITAL_COMMUNITY): Payer: Self-pay | Admitting: *Deleted

## 2017-02-23 ENCOUNTER — Encounter (HOSPITAL_COMMUNITY): Payer: Self-pay | Admitting: *Deleted

## 2017-02-23 NOTE — Telephone Encounter (Signed)
Preadmission screen  

## 2017-02-24 ENCOUNTER — Inpatient Hospital Stay (HOSPITAL_COMMUNITY)
Admission: AD | Admit: 2017-02-24 | Discharge: 2017-02-26 | DRG: 767 | Disposition: A | Discharge status: Home / Self Care | Payer: 59 | Source: Ambulatory Visit | Attending: Obstetrics and Gynecology | Admitting: Obstetrics and Gynecology

## 2017-02-24 ENCOUNTER — Encounter (HOSPITAL_COMMUNITY): Payer: Self-pay

## 2017-02-24 DIAGNOSIS — Z3A40 40 weeks gestation of pregnancy: Secondary | ICD-10-CM

## 2017-02-24 DIAGNOSIS — O43123 Velamentous insertion of umbilical cord, third trimester: Secondary | ICD-10-CM | POA: Diagnosis not present

## 2017-02-24 DIAGNOSIS — E039 Hypothyroidism, unspecified: Secondary | ICD-10-CM | POA: Diagnosis present

## 2017-02-24 DIAGNOSIS — O99284 Endocrine, nutritional and metabolic diseases complicating childbirth: Secondary | ICD-10-CM | POA: Diagnosis present

## 2017-02-24 DIAGNOSIS — Z6791 Unspecified blood type, Rh negative: Secondary | ICD-10-CM

## 2017-02-24 DIAGNOSIS — O26893 Other specified pregnancy related conditions, third trimester: Secondary | ICD-10-CM | POA: Diagnosis present

## 2017-02-24 DIAGNOSIS — Z9889 Other specified postprocedural states: Secondary | ICD-10-CM

## 2017-02-24 DIAGNOSIS — O26899 Other specified pregnancy related conditions, unspecified trimester: Secondary | ICD-10-CM

## 2017-02-24 LAB — CBC
HEMATOCRIT: 39.4 % (ref 36.0–46.0)
Hemoglobin: 13.1 g/dL (ref 12.0–15.0)
MCH: 29.7 pg (ref 26.0–34.0)
MCHC: 33.2 g/dL (ref 30.0–36.0)
MCV: 89.3 fL (ref 78.0–100.0)
Platelets: 187 10*3/uL (ref 150–400)
RBC: 4.41 MIL/uL (ref 3.87–5.11)
RDW: 14.8 % (ref 11.5–15.5)
WBC: 11.2 10*3/uL — AB (ref 4.0–10.5)

## 2017-02-24 MED ORDER — FENTANYL 2.5 MCG/ML BUPIVACAINE 1/10 % EPIDURAL INFUSION (WH - ANES)
14.0000 mL/h | INTRAMUSCULAR | Status: DC | PRN
  Administered 2017-02-25 (×2): 14 mL/h via EPIDURAL
  Filled 2017-02-24 (×2): qty 100

## 2017-02-24 MED ORDER — OXYCODONE-ACETAMINOPHEN 5-325 MG PO TABS: 1.0000 | ORAL_TABLET | ORAL | Status: DC | PRN

## 2017-02-24 MED ORDER — EPHEDRINE 5 MG/ML INJ
10.0000 mg | INTRAVENOUS | Status: DC | PRN
  Filled 2017-02-24: qty 2

## 2017-02-24 MED ORDER — LACTATED RINGERS IV SOLN
INTRAVENOUS | Status: DC
  Administered 2017-02-24 – 2017-02-25 (×4): via INTRAVENOUS

## 2017-02-24 MED ORDER — OXYTOCIN BOLUS FROM INFUSION
500.0000 mL | Freq: Once | INTRAVENOUS | Status: AC
  Administered 2017-02-25: 500 mL via INTRAVENOUS

## 2017-02-24 MED ORDER — PHENYLEPHRINE 40 MCG/ML (10ML) SYRINGE FOR IV PUSH (FOR BLOOD PRESSURE SUPPORT)
80.0000 ug | PREFILLED_SYRINGE | INTRAVENOUS | Status: DC | PRN
  Filled 2017-02-24: qty 10
  Filled 2017-02-24: qty 5
  Filled 2017-02-24: qty 10

## 2017-02-24 MED ORDER — OXYTOCIN 40 UNITS IN LACTATED RINGERS INFUSION - SIMPLE MED
2.5000 [IU]/h | INTRAVENOUS | Status: DC
  Administered 2017-02-25: 2.5 [IU]/h via INTRAVENOUS
  Filled 2017-02-24: qty 1000

## 2017-02-24 MED ORDER — LACTATED RINGERS IV SOLN
500.0000 mL | INTRAVENOUS | Status: DC | PRN
  Administered 2017-02-25: 500 mL via INTRAVENOUS

## 2017-02-24 MED ORDER — ONDANSETRON HCL 4 MG/2ML IJ SOLN
4.0000 mg | Freq: Four times a day (QID) | INTRAMUSCULAR | Status: DC | PRN
  Administered 2017-02-25: 4 mg via INTRAVENOUS
  Filled 2017-02-24: qty 2

## 2017-02-24 MED ORDER — ACETAMINOPHEN 325 MG PO TABS: 650.0000 mg | ORAL_TABLET | ORAL | Status: DC | PRN

## 2017-02-24 MED ORDER — FLEET ENEMA 7-19 GM/118ML RE ENEM: 1.0000 | ENEMA | RECTAL | Status: DC | PRN

## 2017-02-24 MED ORDER — OXYCODONE-ACETAMINOPHEN 5-325 MG PO TABS: 2.0000 | ORAL_TABLET | ORAL | Status: DC | PRN

## 2017-02-24 MED ORDER — PHENYLEPHRINE 40 MCG/ML (10ML) SYRINGE FOR IV PUSH (FOR BLOOD PRESSURE SUPPORT)
80.0000 ug | PREFILLED_SYRINGE | INTRAVENOUS | Status: DC | PRN
  Filled 2017-02-24: qty 5

## 2017-02-24 MED ORDER — LIDOCAINE HCL (PF) 1 % IJ SOLN
30.0000 mL | INTRAMUSCULAR | Status: DC | PRN
  Filled 2017-02-24: qty 30

## 2017-02-24 MED ORDER — LACTATED RINGERS IV SOLN
500.0000 mL | Freq: Once | INTRAVENOUS | Status: AC
  Administered 2017-02-25: 500 mL via INTRAVENOUS

## 2017-02-24 MED ORDER — DIPHENHYDRAMINE HCL 50 MG/ML IJ SOLN: 12.5000 mg | INTRAMUSCULAR | Status: DC | PRN

## 2017-02-24 MED ORDER — SOD CITRATE-CITRIC ACID 500-334 MG/5ML PO SOLN
30.0000 mL | ORAL | Status: DC | PRN
  Administered 2017-02-25: 30 mL via ORAL
  Filled 2017-02-24: qty 15

## 2017-02-24 NOTE — MAU Note (Signed)
Contractions started at 7 pm. Now 3-7 min apart.  No bleeding. Pink mucus. No leaking. Baby moving well. 2 cm on Thursday.

## 2017-02-25 ENCOUNTER — Inpatient Hospital Stay (HOSPITAL_COMMUNITY): Payer: 59 | Admitting: Anesthesiology

## 2017-02-25 ENCOUNTER — Encounter (HOSPITAL_COMMUNITY)
Admission: AD | Disposition: A | Discharge status: Home / Self Care | Payer: Self-pay | Source: Ambulatory Visit | Attending: Obstetrics and Gynecology

## 2017-02-25 ENCOUNTER — Encounter (HOSPITAL_COMMUNITY): Payer: Self-pay | Admitting: *Deleted

## 2017-02-25 DIAGNOSIS — Z9889 Other specified postprocedural states: Secondary | ICD-10-CM

## 2017-02-25 HISTORY — PX: DILATION AND CURETTAGE OF UTERUS: SHX78

## 2017-02-25 LAB — CBC
HEMATOCRIT: 34.2 % — AB (ref 36.0–46.0)
HEMATOCRIT: 33.7 % — AB (ref 36.0–46.0)
HEMOGLOBIN: 11.5 g/dL — AB (ref 12.0–15.0)
Hemoglobin: 11.4 g/dL — ABNORMAL LOW (ref 12.0–15.0)
MCH: 30.2 pg (ref 26.0–34.0)
MCH: 30.5 pg (ref 26.0–34.0)
MCHC: 33.3 g/dL (ref 30.0–36.0)
MCHC: 34.1 g/dL (ref 30.0–36.0)
MCV: 90.5 fL (ref 78.0–100.0)
MCV: 89.4 fL (ref 78.0–100.0)
Platelets: 170 10*3/uL (ref 150–400)
Platelets: 180 10*3/uL (ref 150–400)
RBC: 3.78 MIL/uL — AB (ref 3.87–5.11)
RBC: 3.77 MIL/uL — ABNORMAL LOW (ref 3.87–5.11)
RDW: 15 % (ref 11.5–15.5)
RDW: 14.9 % (ref 11.5–15.5)
WBC: 22.2 10*3/uL — ABNORMAL HIGH (ref 4.0–10.5)
WBC: 21.3 10*3/uL — ABNORMAL HIGH (ref 4.0–10.5)

## 2017-02-25 LAB — RPR: RPR Ser Ql: NONREACTIVE

## 2017-02-25 SURGERY — DILATION AND CURETTAGE
Anesthesia: Epidural | Site: Vagina | Wound class: Clean Contaminated

## 2017-02-25 MED ORDER — OXYCODONE HCL 5 MG PO TABS: 5.0000 mg | ORAL_TABLET | Freq: Once | ORAL | Status: DC | PRN

## 2017-02-25 MED ORDER — MEPERIDINE HCL 25 MG/ML IJ SOLN
INTRAMUSCULAR | Status: AC
  Filled 2017-02-25: qty 1

## 2017-02-25 MED ORDER — DIBUCAINE 1 % RE OINT
1.0000 | TOPICAL_OINTMENT | RECTAL | Status: DC | PRN
Start: 2017-02-25 — End: 2017-02-26

## 2017-02-25 MED ORDER — MIDAZOLAM HCL 2 MG/2ML IJ SOLN
INTRAMUSCULAR | Status: DC | PRN
  Administered 2017-02-25: 2 mg via INTRAVENOUS

## 2017-02-25 MED ORDER — DEXTROSE 5 % IV SOLN
INTRAVENOUS | Status: AC
  Filled 2017-02-25: qty 3000

## 2017-02-25 MED ORDER — CEFAZOLIN SODIUM-DEXTROSE 2-4 GM/100ML-% IV SOLN
INTRAVENOUS | Status: AC
  Filled 2017-02-25: qty 100

## 2017-02-25 MED ORDER — CEFAZOLIN SODIUM-DEXTROSE 2-3 GM-% IV SOLR
INTRAVENOUS | Status: DC | PRN
  Administered 2017-02-25: 2 g via INTRAVENOUS

## 2017-02-25 MED ORDER — BENZOCAINE-MENTHOL 20-0.5 % EX AERO: 1.0000 "application " | INHALATION_SPRAY | CUTANEOUS | Status: DC | PRN

## 2017-02-25 MED ORDER — SENNOSIDES-DOCUSATE SODIUM 8.6-50 MG PO TABS
2.0000 | ORAL_TABLET | ORAL | Status: DC
  Administered 2017-02-25: 2 via ORAL
  Filled 2017-02-25: qty 2

## 2017-02-25 MED ORDER — LIDOCAINE-EPINEPHRINE 1 %-1:100000 IJ SOLN
INTRAMUSCULAR | Status: AC
  Filled 2017-02-25: qty 1

## 2017-02-25 MED ORDER — ONDANSETRON HCL 4 MG/2ML IJ SOLN: 4.0000 mg | INTRAMUSCULAR | Status: DC | PRN

## 2017-02-25 MED ORDER — SODIUM BICARBONATE 8.4 % IV SOLN
INTRAVENOUS | Status: DC | PRN
  Administered 2017-02-25: 7 mL via EPIDURAL
  Administered 2017-02-25 (×3): 5 mL via EPIDURAL

## 2017-02-25 MED ORDER — ACETAMINOPHEN 325 MG PO TABS: 650.0000 mg | ORAL_TABLET | ORAL | Status: DC | PRN

## 2017-02-25 MED ORDER — ONDANSETRON HCL 4 MG PO TABS: 4.0000 mg | ORAL_TABLET | ORAL | Status: DC | PRN

## 2017-02-25 MED ORDER — SIMETHICONE 80 MG PO CHEW: 80.0000 mg | CHEWABLE_TABLET | ORAL | Status: DC | PRN

## 2017-02-25 MED ORDER — ONDANSETRON HCL 4 MG/2ML IJ SOLN: 4.0000 mg | Freq: Four times a day (QID) | INTRAMUSCULAR | Status: DC | PRN

## 2017-02-25 MED ORDER — OXYCODONE HCL 5 MG/5ML PO SOLN: 5.0000 mg | Freq: Once | ORAL | Status: DC | PRN

## 2017-02-25 MED ORDER — IBUPROFEN 600 MG PO TABS
600.0000 mg | ORAL_TABLET | Freq: Four times a day (QID) | ORAL | Status: DC
  Administered 2017-02-25 – 2017-02-26 (×5): 600 mg via ORAL
  Filled 2017-02-25 (×5): qty 1

## 2017-02-25 MED ORDER — ZOLPIDEM TARTRATE 5 MG PO TABS: 5.0000 mg | ORAL_TABLET | Freq: Every evening | ORAL | Status: DC | PRN

## 2017-02-25 MED ORDER — METHYLERGONOVINE MALEATE 0.2 MG/ML IJ SOLN
INTRAMUSCULAR | Status: AC
  Filled 2017-02-25: qty 1

## 2017-02-25 MED ORDER — MIDAZOLAM HCL 2 MG/2ML IJ SOLN
INTRAMUSCULAR | Status: AC
  Filled 2017-02-25: qty 2

## 2017-02-25 MED ORDER — FENTANYL CITRATE (PF) 100 MCG/2ML IJ SOLN
INTRAMUSCULAR | Status: DC | PRN
  Administered 2017-02-25 (×2): 100 ug via INTRAVENOUS

## 2017-02-25 MED ORDER — CEFAZOLIN SODIUM-DEXTROSE 1-4 GM/50ML-% IV SOLN: 1.0000 g | Freq: Three times a day (TID) | INTRAVENOUS | Status: DC

## 2017-02-25 MED ORDER — PRENATAL MULTIVITAMIN CH
1.0000 | ORAL_TABLET | Freq: Every day | ORAL | Status: DC
  Administered 2017-02-25 – 2017-02-26 (×2): 1 via ORAL
  Filled 2017-02-25 (×2): qty 1

## 2017-02-25 MED ORDER — CEFAZOLIN SODIUM-DEXTROSE 1-4 GM/50ML-% IV SOLN
1.0000 g | Freq: Three times a day (TID) | INTRAVENOUS | Status: DC
  Administered 2017-02-25 – 2017-02-26 (×3): 1 g via INTRAVENOUS
  Filled 2017-02-25 (×4): qty 50

## 2017-02-25 MED ORDER — LACTATED RINGERS IV SOLN
INTRAVENOUS | Status: DC | PRN
  Administered 2017-02-25 (×2): via INTRAVENOUS

## 2017-02-25 MED ORDER — FENTANYL CITRATE (PF) 100 MCG/2ML IJ SOLN
INTRAMUSCULAR | Status: AC
  Filled 2017-02-25: qty 2

## 2017-02-25 MED ORDER — LIDOCAINE-EPINEPHRINE (PF) 2 %-1:200000 IJ SOLN
INTRAMUSCULAR | Status: AC
  Filled 2017-02-25: qty 20

## 2017-02-25 MED ORDER — METHYLERGONOVINE MALEATE 0.2 MG/ML IJ SOLN
INTRAMUSCULAR | Status: DC | PRN
  Administered 2017-02-25: 0.2 mg via INTRAMUSCULAR

## 2017-02-25 MED ORDER — OXYCODONE-ACETAMINOPHEN 5-325 MG PO TABS: 1.0000 | ORAL_TABLET | ORAL | Status: DC | PRN

## 2017-02-25 MED ORDER — ONDANSETRON HCL 4 MG/2ML IJ SOLN
INTRAMUSCULAR | Status: DC | PRN
  Administered 2017-02-25: 4 mg via INTRAVENOUS

## 2017-02-25 MED ORDER — INFLUENZA VAC SPLIT QUAD 0.5 ML IM SUSY: 0.5000 mL | PREFILLED_SYRINGE | INTRAMUSCULAR | Status: DC

## 2017-02-25 MED ORDER — FENTANYL CITRATE (PF) 100 MCG/2ML IJ SOLN: 25.0000 ug | INTRAMUSCULAR | Status: DC | PRN

## 2017-02-25 MED ORDER — MISOPROSTOL 200 MCG PO TABS
ORAL_TABLET | ORAL | Status: AC
  Filled 2017-02-25: qty 5

## 2017-02-25 MED ORDER — MISOPROSTOL 25 MCG QUARTER TABLET
ORAL_TABLET | ORAL | Status: DC | PRN
  Administered 2017-02-25: 800 ug via RECTAL

## 2017-02-25 MED ORDER — WITCH HAZEL-GLYCERIN EX PADS: 1.0000 "application " | MEDICATED_PAD | CUTANEOUS | Status: DC | PRN

## 2017-02-25 MED ORDER — BUPIVACAINE HCL (PF) 0.25 % IJ SOLN
INTRAMUSCULAR | Status: DC | PRN
  Administered 2017-02-25: 3 mL via EPIDURAL

## 2017-02-25 MED ORDER — ONDANSETRON HCL 4 MG/2ML IJ SOLN
INTRAMUSCULAR | Status: AC
  Filled 2017-02-25: qty 2

## 2017-02-25 MED ORDER — COCONUT OIL OIL
1.0000 "application " | TOPICAL_OIL | Status: DC | PRN
  Administered 2017-02-25: 1 via TOPICAL
  Filled 2017-02-25: qty 120

## 2017-02-25 MED ORDER — LIDOCAINE HCL (PF) 1 % IJ SOLN
INTRAMUSCULAR | Status: DC | PRN
  Administered 2017-02-25: 6 mL via EPIDURAL
  Administered 2017-02-25: 7 mL via EPIDURAL

## 2017-02-25 MED ORDER — OXYCODONE-ACETAMINOPHEN 5-325 MG PO TABS
2.0000 | ORAL_TABLET | ORAL | Status: DC | PRN
Start: 2017-02-25 — End: 2017-02-26

## 2017-02-25 MED ORDER — CEFAZOLIN SODIUM-DEXTROSE 2-4 GM/100ML-% IV SOLN: 2.0000 g | Freq: Once | INTRAVENOUS | Status: DC

## 2017-02-25 MED ORDER — MEPERIDINE HCL 25 MG/ML IJ SOLN
6.2500 mg | INTRAMUSCULAR | Status: DC | PRN
  Administered 2017-02-25: 12.5 mg via INTRAVENOUS

## 2017-02-25 MED ORDER — DIPHENHYDRAMINE HCL 25 MG PO CAPS: 25.0000 mg | ORAL_CAPSULE | Freq: Four times a day (QID) | ORAL | Status: DC | PRN

## 2017-02-25 MED ORDER — LEVOTHYROXINE SODIUM 125 MCG PO TABS
125.0000 ug | ORAL_TABLET | Freq: Every morning | ORAL | Status: DC
  Administered 2017-02-25 – 2017-02-26 (×2): 125 ug via ORAL
  Filled 2017-02-25 (×3): qty 1

## 2017-02-25 MED ORDER — METHYLERGONOVINE MALEATE 0.2 MG PO TABS
0.2000 mg | ORAL_TABLET | Freq: Four times a day (QID) | ORAL | Status: AC
Start: 2017-02-25 — End: 2017-02-25
  Administered 2017-02-25 (×4): 0.2 mg via ORAL
  Filled 2017-02-25 (×4): qty 1

## 2017-02-25 SURGICAL SUPPLY — 19 items
ADAPTER VACURETTE TBG SET 14 (CANNULA) ×3 IMPLANT
CLOTH BEACON ORANGE TIMEOUT ST (SAFETY) ×3 IMPLANT
CONTAINER PREFILL 10% NBF 60ML (FORM) ×6 IMPLANT
DECANTER SPIKE VIAL GLASS SM (MISCELLANEOUS) ×3 IMPLANT
GLOVE BIO SURGEON STRL SZ 6.5 (GLOVE) ×2 IMPLANT
GLOVE BIO SURGEONS STRL SZ 6.5 (GLOVE) ×1
GLOVE BIOGEL PI IND STRL 7.0 (GLOVE) ×2 IMPLANT
GLOVE BIOGEL PI INDICATOR 7.0 (GLOVE) ×4
GLOVE INDICATOR 6.5 STRL GRN (GLOVE) ×3 IMPLANT
GOWN STRL REUS W/TWL LRG LVL3 (GOWN DISPOSABLE) ×9 IMPLANT
PACK VAGINAL MINOR WOMEN LF (CUSTOM PROCEDURE TRAY) ×3 IMPLANT
PAD OB MATERNITY 4.3X12.25 (PERSONAL CARE ITEMS) ×3 IMPLANT
PAD PREP 24X48 CUFFED NSTRL (MISCELLANEOUS) ×3 IMPLANT
SET BERKELEY SUCTION TUBING (SUCTIONS) ×3 IMPLANT
TOWEL OR 17X24 6PK STRL BLUE (TOWEL DISPOSABLE) ×6 IMPLANT
TRAY FOLEY BAG SILVER LF 14FR (SET/KITS/TRAYS/PACK) ×3 IMPLANT
VACURETTE 14MM CVD 1/2 BASE (CANNULA) ×3 IMPLANT
VACURETTE 16MM ASPIR CVD .5 (CANNULA) ×3 IMPLANT
WATER STERILE IRR 1000ML POUR (IV SOLUTION) ×3 IMPLANT

## 2017-02-25 NOTE — Op Note (Signed)
Preoperative diagnosis: Postop diagnosis: as above.  Procedure: Suction D&C; ultrasound guided and used during entire procedure` Anesthesia: epidural Surgeon:Blaike Newburn, MD Assistant: Derrell Lolling IV fluids:  1664ml, 64mU pitocin  Estimated blood loss : 1796ml Urine output:  Foley catheter placed before procedure, 584ml clear urine Complications: hemorrhage Condition stable Disposition PACU  Specimen: products of conception sent to pathology.  Procedure:   Indication: Decision was made to proceed with suction D&C d/t retained placenta. Risks/ complications of surgery including infection, bleeding, damage to internal organs including uterine perforation, Asherman syndrome were reviewed. She voiced understanding and gave informed written consent.  Patient was brought to the operating room with IV running. She received preop 2 grams Ancef. She already had epidural and dosed for procedure.  She was then placed in the dorsolithotomy position. The patient were prepped and draped in standard fashion. Foley catheter placed and clear urine drained.  Bimanual exam revealed uterus to be enlarged, about 20 wga, placenta noted near fundus, somewhat adherent .  Speculum was placed and cervix was grasped with a ringed forcep. Ultrasound was performed and large amount of placenta noted. A 26mm curved suction curette was gently advanced into the uterine cavity w/o difficulty under u/s guidance.  The suction tip was then connected to the tubing and suction pressure confirmed to be in the green.  The curette was then introduced in the uterine cavity and suction evacuation of products of conception was done in multiple step wise fashion. There were small pieces of placenta removed but placenta seemed to be more adherent.  Gentle sharp curettage was performed, and with u/s was able to begin to break up placenta and remove placental pieces.  This was done with several passes and then Suction cannula was  reintroduced. The placenta then appeared to have been removed completely.  Uterine lining appeared wnl and no placenta noted in endometrial cavity. There was continuous bleeding during the procedure with clots.  Once placenta completey removed, bleeding decreased and after several minutes stopped.  Uterine massage was perfomed and good tone noted, LUS massaged and clots cleared.  Patient was given Methergine 0.2 mg IM. She also received Pitocin 40 units in IV bag that was begun after delivery.  833mcg cytotec was then placed rectally.  During time of medications (methergine/cytotec) given, bleeding was scant to none. The bleeding was controlled well and uterus was firm on exam.  At this point the procedure was complete.  All counts are correct x2. Specimen products of conception. No complications. Patient was made supine dorsal anesthesia and brought to the recovery room in stable condition.

## 2017-02-25 NOTE — Anesthesia Preprocedure Evaluation (Addendum)
Anesthesia Evaluation  Patient identified by MRN, date of birth, ID band Patient awake    Reviewed: Allergy & Precautions, H&P , NPO status , Patient's Chart, lab work & pertinent test results  Airway Mallampati: I  TM Distance: >3 FB Neck ROM: full    Dental no notable dental hx. (+) Teeth Intact   Pulmonary neg pulmonary ROS,    Pulmonary exam normal        Cardiovascular negative cardio ROS Normal cardiovascular exam     Neuro/Psych negative neurological ROS  negative psych ROS   GI/Hepatic negative GI ROS, Neg liver ROS,   Endo/Other    Renal/GU negative Renal ROS     Musculoskeletal   Abdominal (+) + obese,   Peds  Hematology negative hematology ROS (+)   Anesthesia Other Findings   Reproductive/Obstetrics (+) Pregnancy                             Anesthesia Physical  Anesthesia Plan  ASA: II  Anesthesia Plan: Epidural   Post-op Pain Management:    Induction:   PONV Risk Score and Plan:   Airway Management Planned:   Additional Equipment:   Intra-op Plan:   Post-operative Plan:   Informed Consent: I have reviewed the patients History and Physical, chart, labs and discussed the procedure including the risks, benefits and alternatives for the proposed anesthesia with the patient or authorized representative who has indicated his/her understanding and acceptance.     Plan Discussed with:   Anesthesia Plan Comments:        Anesthesia Quick Evaluation

## 2017-02-25 NOTE — Addendum Note (Signed)
Addendum  created 02/25/17 1407 by Asher Muir, CRNA   Sign clinical note

## 2017-02-25 NOTE — Progress Notes (Signed)
Patient to transfer to OR #1 for manual removal of placenta. U/S per Dr. Murrell Redden shows remaining fragments after attempting manual removal of placenta in delivery room. Patient has consented and is being prepped for OR.

## 2017-02-25 NOTE — Lactation Note (Signed)
This note was copied from a baby's chart. Lactation Consultation Note  Patient Name: Betty Reyes [id number] Date: 02/25/2017 Reason for consult: Initial assessment   P2, baby 73 hours old. Mother states she breastfed her first child for approx 6 weeks and noticed reduction in milk supply. She states first child had possibly mid posterior tongue tie that ENT advised not to revise. Mother states she was very sore with first child from breastfeeding and had supply issues and had to supplement and pump. Initial oral assessment on this baby indicated that when labial frenulum is flanged, the gum blanches. Reviewed hand expression w/ drops expressed. Attempted latching in football hold but baby is spitty. Encouraged STS. Mom encouraged to feed baby 8-12 times/24 hours and with feeding cues.  Discussed APNO if needed for later on if soreness becomes an issue. Mom made aware of O/P services, breastfeeding support groups, community resources, and our phone # for post-discharge questions.  Provided mother w/ coconut oil.  Also encouraged applying ebm.    Maternal Data Has patient been taught Hand Expression?: Yes Does the patient have breastfeeding experience prior to this delivery?: Yes  Feeding Feeding Type: Breast Fed Length of feed: 0 min (latch but no suck   falling asleep at breast)  LATCH Score                   Interventions    Lactation Tools Discussed/Used     Consult Status Consult Status: Follow-up Date: 02/26/17 Follow-up type: In-patient    [name] [facility] 02/25/2017, 11:16 PM

## 2017-02-25 NOTE — Anesthesia Postprocedure Evaluation (Signed)
Anesthesia Post Note  Patient: Betty Reyes  Procedure(s) Performed: Procedure(s) (LRB): DILATATION AND CURETTAGE (N/A)     Patient location during evaluation: Mother Baby Anesthesia Type: Epidural Level of consciousness: awake Pain management: satisfactory to patient Vital Signs Assessment: post-procedure vital signs reviewed and stable Respiratory status: spontaneous breathing Cardiovascular status: stable Anesthetic complications: no    Last Vitals:  Vitals:   02/25/17 1115 02/25/17 1230  BP: (!) 118/58 112/72  Pulse: 86 93  Resp: 18 18  Temp: 37.4 C 37.2 C  SpO2: 95% 97%    Last Pain:  Vitals:   02/25/17 1230  TempSrc: Oral  PainSc: 2    Pain Goal: Patients Stated Pain Goal: 4 (02/24/17 2243)               Casimer Lanius

## 2017-02-25 NOTE — Transfer of Care (Signed)
Immediate Anesthesia Transfer of Care Note  Patient: Betty Reyes  Procedure(s) Performed: Procedure(s): DILATATION AND CURETTAGE (N/A)  Patient Location: PACU  Anesthesia Type:Epidural  Level of Consciousness: sedated  Airway & Oxygen Therapy: Patient Spontanous Breathing and Patient connected to nasal cannula oxygen  Post-op Assessment: Report given to RN  Post vital signs: Reviewed and stable  Last Vitals:  Vitals:   02/25/17 0731 02/25/17 0745  BP: 111/61 109/62  Pulse: 79 80  Resp:    Temp:  36.7 C  SpO2:      Last Pain:  Vitals:   02/25/17 0745  TempSrc: Oral  PainSc:       Patients Stated Pain Goal: 4 (56/86/16 8372)  Complications: No apparent anesthesia complications

## 2017-02-25 NOTE — Anesthesia Postprocedure Evaluation (Signed)
Anesthesia Post Note  Patient: Betty Reyes  Procedure(s) Performed: Procedure(s) (LRB): DILATATION AND CURETTAGE (N/A)     Patient location during evaluation: PACU Anesthesia Type: Epidural Level of consciousness: awake and alert Pain management: pain level controlled Vital Signs Assessment: post-procedure vital signs reviewed and stable Respiratory status: spontaneous breathing, nonlabored ventilation and respiratory function stable Cardiovascular status: stable Postop Assessment: no headache, no backache and epidural receding Anesthetic complications: no    Last Vitals:  Vitals:   02/25/17 1015 02/25/17 1030  BP: 113/72 118/68  Pulse: 87 78  Resp: (!) 22 16  Temp:    SpO2: 96% 95%    Last Pain:  Vitals:   02/25/17 0745  TempSrc: Oral  PainSc:    Pain Goal: Patients Stated Pain Goal: 4 (02/24/17 2243)               [place]

## 2017-02-25 NOTE — Anesthesia Procedure Notes (Signed)
Epidural Patient location during procedure: OB Start time: 02/25/2017 12:24 AM End time: 02/25/2017 12:28 AM  Staffing Anesthesiologist: Lyn Hollingshead Performed: anesthesiologist   Preanesthetic Checklist Completed: patient identified, site marked, surgical consent, pre-op evaluation, timeout performed, IV checked, risks and benefits discussed and monitors and equipment checked  Epidural Patient position: sitting Prep: site prepped and draped and DuraPrep Patient monitoring: continuous pulse ox and blood pressure Approach: midline Location: L3-L4 Injection technique: LOR air  Needle:  Needle type: Tuohy  Needle gauge: 17 G Needle length: 9 cm and 9 Needle insertion depth: 6 cm Catheter type: closed end flexible Catheter size: 19 Gauge Catheter at skin depth: 11 cm Test dose: negative and Other  Assessment Sensory level: T9 Events: blood not aspirated, injection not painful, no injection resistance, negative IV test and no paresthesia

## 2017-02-25 NOTE — H&P (Signed)
Betty Reyes is a 41 y.o. female G2P1001 at 33.1 presenting for painful contractions.  Denies any LOF, vaginal bleeding.  +FM.  Antepartum course complicated by hypothyroid (s/p thyroidectomy) and has been stable on synthroid.  She is also AMA with negative genetic screening.  Course also complicated by s<d and u/s done 9/13 and : 7lb8oz, afi 17.8cm, marginal cord insertion.    PNCare at [facility] since 10 wks History OB History    Gravida Para Term Preterm AB Living   2 1 1     1    SAB TAB Ectopic Multiple Live Births         0 1     Past Medical History:  Diagnosis Date  . Bradycardia   . Cancer ([place])   . Cervical polyp   . Fatigue   . History of radioactive iodine thyroid ablation   . Hypothyroidism, postop   . Obesity   . Postpartum care following vaginal delivery (9/6) 02/16/2015  . Thyroid cancer ([place])   . Thyroid disease    HYPOTHYROIDISM   Past Surgical History:  Procedure Laterality Date  . TOTAL THYROIDECTOMY     Family History: family history includes Cancer in her brother and mother; Diabetes in her maternal grandfather; Heart attack in her father, paternal grandfather, paternal grandmother, and paternal uncle; Heart disease in her father; Hyperlipidemia in her father; Hypertension in her father, paternal grandfather, paternal grandmother, and paternal uncle; Hypothyroidism in her paternal grandmother. Social History:  reports that she has never smoked. She has never used smokeless tobacco. She reports that she does not drink alcohol or use drugs.  ROS  Dilation: 7 Effacement (%): 90 Station: -2 Exam by:: Dr. Murrell Redden  Blood pressure 118/70, pulse 64, temperature 97.8 F (36.6 C), temperature source Axillary, resp. rate 18, height 5\' 7"  (1.702 m), weight 199 lb (90.3 kg), SpO2 94 %, unknown if currently breastfeeding. Exam Physical Exam  Prenatal labs: ABO, Rh: --/--/B NEG (09/15 2330) Antibody: POS (09/15 2330) Rubella: !Error!immune RPR: Nonreactive  (02/21 0000)  HBsAg: Negative (02/21 0000)  HIV: Non-reactive (02/21 0000)  GBS: Negative (08/15 0000)  1 hr Glucola - passed Genetic screening : nt/ultrascreen/afp nml Anatomy US: normal anatomy, marginal cord insertion  Physical Exam:  A&O x 3 HEENT : wnl Lungs : ctab CV : rrr Abdo : soft, nt; gravid; efw 7-7 1/2 lbs Extr : no edema, nt bilat Pelvic : 7/90/-2; arom with moderate amt clear fluid; cephalic  FHT: initial baseline 170s-180s then down to 140s; basline change then to 115-120s, normal variability, +accels; no decels; baseline now about 115 TOCO: irreg, q 2-6 min  Assessment/Plan:  iup at 40.1wga 1. Active labor - admit and plan svd; s/p epidural 2. Fetal status overall reassuring - initial tachy resolved after fluid bolus; now low baseline but reactive; follow closely 3. gbs neg 4. Rh neg 5. Rubella immune 6. Hypothyroid - stable 7. Marginal cord insertion 8. ama   Charyl Bigger 02/25/2017, 5:47 AM

## 2017-02-26 ENCOUNTER — Encounter (HOSPITAL_COMMUNITY): Payer: Self-pay | Admitting: *Deleted

## 2017-02-26 MED ORDER — IBUPROFEN 600 MG PO TABS: 600.0000 mg | ORAL_TABLET | Freq: Four times a day (QID) | ORAL | 0 refills | Status: DC

## 2017-02-26 NOTE — Lactation Note (Signed)
This note was copied from a baby's chart. Lactation Consultation Note  Patient Name: Betty Reyes [id number] Date: 02/26/2017 Reason for consult: Follow-up assessment;Nipple pain/trauma  2nd [place] visit today. LC was told mom is going home early.  LC discussed prevention and tx of  Engorgement. Per  Mom has not experienced engorgement of her 1st baby. LC reminded mom since this is her 2nd baby, let down can be greater,  And volume can be more.  MBURN had reviewed S/S of mastitis.  Mother informed of post-discharge support and given phone number to the lactation department, including services for phone call assistance; out-patient appointments; and breastfeeding support group. List of other breastfeeding resources in the community given in the handout. Encouraged mother to call for problems or concerns related to breastfeeding.   Maternal Data Has patient been taught Hand Expression?: Yes Does the patient have breastfeeding experience prior to this delivery?: Yes  Feeding Feeding Type: Breast Fed Length of feed: 15 min (multiple swallows noted )  LATCH Score Latch: Grasps breast easily, tongue down, lips flanged, rhythmical sucking.  Audible Swallowing: Spontaneous and intermittent  Type of Nipple: Everted at rest and after stimulation  Comfort (Breast/Nipple): Soft / non-tender  Hold (Positioning): Assistance needed to correctly position infant at breast and maintain latch.  LATCH Score: 9  Interventions Interventions: Breast feeding basics reviewed;Assisted with latch;Skin to skin;Breast massage;Hand express;Breast compression;Adjust position;Support pillows;Position options;Expressed milk;Shells;Comfort gels  Lactation Tools Discussed/Used Tools: Shells;Comfort gels (LC instructed - due to sensitive pink nipples ) Shell Type: Inverted WIC Program: No   Consult Status Consult Status: Follow-up Date: 02/27/17 Follow-up type: In-patient    [place] 02/26/2017, 2:30 PM

## 2017-02-26 NOTE — Addendum Note (Signed)
Addendum  created 02/26/17 0752 by Riki Sheer, CRNA   Sign clinical note

## 2017-02-26 NOTE — Addendum Note (Signed)
Addendum  created 02/26/17 1030 by Lyn Hollingshead, MD   Anesthesia Staff edited

## 2017-02-26 NOTE — Anesthesia Postprocedure Evaluation (Signed)
Anesthesia Post Note  Patient: Betty Reyes  Procedure(s) Performed: Procedure(s) (LRB): DILATATION AND CURETTAGE (N/A)     Patient location during evaluation: Mother Baby Anesthesia Type: Epidural Level of consciousness: awake and alert Pain management: pain level controlled Vital Signs Assessment: post-procedure vital signs reviewed and stable Respiratory status: spontaneous breathing, nonlabored ventilation and respiratory function stable Cardiovascular status: stable Postop Assessment: no headache, no backache and epidural receding Anesthetic complications: no    Last Vitals:  Vitals:   02/25/17 1620 02/26/17 0542  BP: 118/62 108/62  Pulse: 78 71  Resp: 18 16  Temp: 36.7 C 36.8 C  SpO2:  99%    Last Pain:  Vitals:   02/26/17 0542  TempSrc: Oral  PainSc:    Pain Goal: Patients Stated Pain Goal: 4 (02/24/17 2243)               Riki Sheer

## 2017-02-26 NOTE — Discharge Summary (Signed)
Obstetric Discharge Summary   Patient Name: Betty Reyes Betty Reyes DOB: 14-Oct-1975 MRN: [id number]  Date of Admission: 02/24/2017 Date of Discharge: 02/26/2017 Date of Delivery: 02/25/2017 Gestational Age at Delivery: [redacted]w[redacted]d  Primary OB: [facility] - Dr. Pamala Hurry  Antepartum complications: \ - Hypothyroidism on Synthroid 133mcg daily (s/p thyroidectomy and thyroid cancer) - AMA with negative genetic screening - Marginal cord insertion  - Uterine size < dates, but AGA on growth sono Prenatal Labs:  ABO, Rh: --/--/B NEG (09/15 2330) Antibody: POS (09/15 2330) - passively acquired anti-D Rubella: Immune RPR: Nonreactive (02/21 0000)  HBsAg: Negative (02/21 0000)  HIV: Non-reactive (02/21 0000)  GBS: Negative (08/15 0000)  1 hr Glucola - passed Genetic screening : nt/ultrascreen/afp nml Anatomy US: normal anatomy, marginal cord insertion  Admitting Diagnosis: Labor at term   Secondary Diagnoses: Patient Active Problem List   Diagnosis Date Noted  . Retained placenta, delivered 02/25/2017  . Status post dilation and curettage 02/25/2017  . Hypothyroidism 02/18/2015  . Rh negative status during pregnancy 02/18/2015  . Postpartum care following vaginal delivery (9/16) 02/16/2015  . PROM (premature rupture of membranes) 02/15/2015  . Encounter for supervision of other normal pregnancy 06/22/2014  . Infertility, female 03/10/2014  . Female hirsutism 03/10/2014  . Obesity, unspecified 10/31/2012  . Iatrogenic hyperthyroidism 04/24/2012  . Obesity 11/07/2010  . Hypothyroidism, postop   . Fatigue   . Bradycardia   . Multifocal papillary thyroid cancer, s/p total thyroidectomy 05/2010 and Radioactive Iodine Ablation 06/2010 10/03/2010    Augmentation:  None Complications: Retained placenta requiring ultrasound guided suction D&C for removal of placenta resulting in Postpartum Hemorrhage. Pt. Received IV Pitocin bolus, Methergine IM x 1, then 24 hour course of Methergine, and 860mcg  Cytotec rectally. Hgb/Hct stable.   Date of Delivery: 02/25/17 Delivered By: Dr. Murrell Redden Delivery Type: spontaneous vaginal delivery Anesthesia: epidural Placenta: manual removal - see op note Laceration: 1st degree  Episiotomy: none  Newborn Data: Live born female  Birth Weight: 6 lb 6.7 oz (2910 g) APGAR: 8, 9   Postpartum Course  (Vaginal Delivery): Patient's postpartum course was complicated by Retained placenta requiring ultrasound guided suction D&C for removal of placenta resulting in Postpartum Hemorrhage. Pt. Received IV Pitocin bolus, Methergine IM x 1, then 24 hour course of Methergine, and 817mcg Cytotec rectally. Hgb/Hct stable. She received 24 hours of Ancef.  By time of discharge on PPD#1, her pain was controlled on oral pain medications; she had appropriate lochia and was ambulating, voiding without difficulty and tolerating regular diet.  She was deemed stable for discharge to home.     Labs: CBC Latest Ref Rng & Units 02/25/2017 02/25/2017 02/24/2017  WBC 4.0 - 10.5 K/uL 21.3(H) 22.2(H) 11.2(H)  Hemoglobin 12.0 - 15.0 g/dL 11.5(L) 11.4(L) 13.1  Hematocrit 36.0 - 46.0 % 33.7(L) 34.2(L) 39.4  Platelets 150 - 400 K/uL 180 170 187   B NEG  Physical exam:  BP 108/62 (BP Location: Right Arm)   Pulse 71   Temp 98.3 F (36.8 C) (Oral)   Resp 16   Ht 5\' 7"  (1.702 m)   Wt 90.3 kg (199 lb)   SpO2 99%   Breastfeeding? Unknown   BMI 31.17 kg/m  General: alert and no distress Pulm: normal respiratory effort Lochia: appropriate Abdomen: soft, NT Uterine Fundus: firm, below umbilicus Perineum: healing well, no significant erythema, no significant edema Extremities: No evidence of DVT seen on physical exam. No lower extremity edema.  Disposition: stable, discharge to home Baby Feeding: breast  milk Baby Disposition: home with mom  Contraception: undecided  Rh Immune globulin given: N/A, baby was B Neg Rubella vaccine given: N/A Tdap vaccine given in AP or PP  setting: UTD Flu vaccine given in AP or PP setting: ordered during PP visit   Plan:  Betty Reyes was discharged to home in good condition. Follow-up appointment at [facility] in 6 weeks for postpartum visit  Discharge Instructions: Per After Visit Summary. Activity: Advance as tolerated. Pelvic rest for 6 weeks.  Refer to After Visit Summary Diet: Regular, Heart Healthy Discharge Medications: Allergies as of 02/26/2017   No Known Allergies     Medication List    TAKE these medications   cetirizine 10 MG tablet Commonly known as:  ZYRTEC Take 10 mg by mouth daily.   DHA 200 MG Caps Take 1 capsule by mouth at bedtime. Reported on 11/09/2015   ibuprofen 600 MG tablet Commonly known as:  ADVIL,MOTRIN Take 1 tablet (600 mg total) by mouth every 6 (six) hours.   levothyroxine 125 MCG tablet Commonly known as:  SYNTHROID, LEVOTHROID Take 125 mcg by mouth daily before breakfast.   prenatal multivitamin Tabs tablet Take 1 tablet by mouth at bedtime.            Discharge Care Instructions        Start     Ordered   02/26/17 0000  ibuprofen (ADVIL,MOTRIN) 600 MG tablet  Every 6 hours    Question:  Supervising Provider  Answer:  Aloha Gell   02/26/17 1247   02/26/17 0000  Diet - low sodium heart healthy     02/26/17 1247   02/26/17 0000  Call MD for:  extreme fatigue     02/26/17 1247   02/26/17 0000  Call MD for:  persistant dizziness or light-headedness     02/26/17 1247   02/26/17 0000  Call MD for:  hives     02/26/17 1247   02/26/17 0000  Call MD for:  difficulty breathing, headache or visual disturbances     02/26/17 1247   02/26/17 0000  Call MD for:  redness, tenderness, or signs of infection (pain, swelling, redness, odor or green/yellow discharge around incision site)     02/26/17 1247   02/26/17 0000  Call MD for:  severe uncontrolled pain     02/26/17 1247   02/26/17 0000  Call MD for:  persistant nausea and vomiting     02/26/17 1247    02/26/17 0000  Call MD for:  temperature >100.4     02/26/17 1247   02/26/17 0000  Call MD for:     02/26/17 1247   02/26/17 0000  Activity as tolerated     02/26/17 1247   02/26/17 0000  Sexual acrtivity    Comments:  No intercourse for 6 weeks   02/26/17 1247     Outpatient follow up:  Follow-up Information    Aloha Gell, MD. Schedule an appointment as soon as possible for a visit in 6 week(s).   Specialty:  Obstetrics and Gynecology Why:  Postpartum visit  Contact information: [address] [place] [address] [phone]           Signed:  Lars Pinks, MSN, CNM [facility]

## 2017-02-26 NOTE — Progress Notes (Addendum)
PPD #1, SVD, 1st degree laceration, s/p suction D&C for retained placenta Betty Reyes  S:  Reports feeling very tired, has not slept much since Friday  Reports minimal soreness s/p suction D&C, states bleeding has improved - s/p Rectal Cytotec, IM Methergine, and IV Pitocin bolus             Tolerating po/ No nausea or vomiting / Denies dizziness or SOB             Bleeding is light             Pain controlled with Motrin             Up ad lib / ambulatory / voiding QS  Newborn breast feeding - working with lactation - per lactation, adjusted Betty's latch, now using nipple shield and comfort gels. Per lactation, nipples are pink/red bilaterally. Pt. Denies symptoms of yeast. States she has some soreness with latch, but feels it is improving. Discussed use for [employer] All Purpose Nipple Cream PRN if needed.   O:               VS: BP 108/62 (BP Location: Right Arm)   Pulse 71   Temp 98.3 F (36.8 C) (Oral)   Resp 16   Ht 5\' 7"  (1.702 m)   Wt 90.3 kg (199 lb)   SpO2 99%   Breastfeeding? Unknown   BMI 31.17 kg/m    LABS:              Recent Labs  02/25/17 0937 02/25/17 1257  WBC 22.2* 21.3*  HGB 11.4* 11.5*  PLT 170 180               Blood type: --/--/B NEG (09/15 2330)/ Betty B Neg  Rubella: Immune (02/21 0000)                     I&O: Intake/Output      09/16 0701 - 09/17 0700 09/17 0701 - 09/18 0700   I.V. (mL/kg) 2800 (31)    Total Intake(mL/kg) 2800 (31)    Urine (mL/kg/hr) 1800 (0.8)    Other 500    Blood 1750    Total Output 4050     Net -1250                        Physical Exam:             Alert and oriented X3  Breasts:   Lungs: Clear and unlabored  Heart: regular rate and rhythm / no mumurs  Abdomen: soft, non-tender, non-distended, ecchymosis noted in suprapubic area likely from attempt to remove placenta             Fundus: firm, non-tender, U-1  Perineum: 1st degree laceration, healing well, no significant erythema or edema, no evidence of a  hematoma  Lochia: appropriate, no clots  Extremities: trace pedal edema, no calf pain or tenderness    A/P: PPD # 1, SVD  1st degree laceration - healing well   S/p Suction D&C for retained placenta    - No s/s of infection   - D/C Ancef now  RH Negative, Betty is RH Negative    - no Rhogam indicated   Doing well - stable status  Routine post partum orders  Encouraged to rest today   D/C IV today  May shower  Continue working with Lactation   Anticipate discharge home tomorrow  Consult: Dr.  Janae Sauce, MSN, CNM [facility]   Addendum: Betty cleared by Peds to go home today and pt. Desires discharge today.  Feels better with breastfeeding, and will utilize outpatient support if needed. Discharge home. [facility] discharge book and instructions reviewed. F/u with Betty Reyes in 2 weeks for interval visit.  Betty Reyes, CNM

## 2017-02-26 NOTE — Lactation Note (Addendum)
This note was copied from a baby's chart. Lactation Consultation Note  Patient Name: Betty Reyes [id number] Date: 02/26/2017 Reason for consult: Follow-up assessment;Nipple pain/trauma  Betty Reyes is 49 hours old .  LC reviewed doc flow sheets and updated  Per mom.  Mom concerned baby isn't getting enough, LC discussed findings from  Doc flow sheets and mentioned how the baby has been consistent with the  Breast feeding, #of voids and stools, and bili being low.  @ this consult mom changed a med wet diaper.  And baby was showing feeding cues.  Due to the soreness mom is feeling - LC asked for permission from mom  To check the baby's mouth- with gloved fingers.  LC noted the baby to have a small mouth, recess chin, upper lip stretches  Well with exam , and when latched, the labial frenulum is just above the gum line  Baby doesn't not extend tongue over gum line, and small notch noted, some lateralization  Of tongue noted.  [place] worked with mom on positioning, depth / foot ball , depth achieved with breast compressions  And multiple swallows noted , increased with breast compressions.  Baby fed 15 mins , and was falling asleep so LC had mom take baby off and nipple well rounded.   LC instructed mom on the use of breast shells between feedings except when sleeping , and  Comfort gel. Per mom, used them both with her 1st baby.  Nipples are both pinky red and LC encouraged mom to apply EBM to nipples liberally.  [place] spoke with Betty Reyes Ob NP concerns over moms sore , pinky red nipples at 17 hours.  Explained findings and tx so far. She plans to talk about any yeast issues.   Mom also very tired and L C encouraged her to try to get a nap and rest.      Maternal Data Has patient been taught Hand Expression?: Yes Does the patient have breastfeeding experience prior to this delivery?: Yes  Feeding Feeding Type: Breast Fed Length of feed: 15 min (multiple swallows noted )  LATCH  Score Latch: Grasps breast easily, tongue down, lips flanged, rhythmical sucking.  Audible Swallowing: Spontaneous and intermittent  Type of Nipple: Everted at rest and after stimulation  Comfort (Breast/Nipple): Soft / non-tender  Hold (Positioning): Assistance needed to correctly position infant at breast and maintain latch.  LATCH Score: 9  Interventions Interventions: Breast feeding basics reviewed;Assisted with latch;Skin to skin;Breast massage;Hand express;Breast compression;Adjust position;Support pillows;Position options;Expressed milk;Shells;Comfort gels  Lactation Tools Discussed/Used Tools: Shells;Comfort gels (LC instructed - due to sensitive pink nipples ) Shell Type: Inverted WIC Program: No   Consult Status Consult Status: Follow-up Date: 02/27/17 Follow-up type: In-patient    [place] 02/26/2017, 11:39 AM

## 2017-02-28 LAB — TYPE AND SCREEN
ABO/RH(D): B NEG
Antibody Screen: POSITIVE
DAT, IgG: NEGATIVE
UNIT DIVISION: 0
Unit division: 0

## 2017-02-28 LAB — BPAM RBC
BLOOD PRODUCT EXPIRATION DATE: 201810202359
Blood Product Expiration Date: 201810202359
Unit Type and Rh: 9500
Unit Type and Rh: 9500

## 2017-03-02 ENCOUNTER — Inpatient Hospital Stay (HOSPITAL_COMMUNITY): Admission: RE | Admit: 2017-03-02 | Payer: 59 | Source: Ambulatory Visit

## 2017-04-03 MED FILL — SYNTHROID 125 MCG TABLET: 125 | 90 days supply | Qty: 90 | Fill #2

## 2017-04-04 DIAGNOSIS — E890 Postprocedural hypothyroidism: Secondary | ICD-10-CM | POA: Diagnosis not present

## 2017-04-04 LAB — T3, FREE: T3 FREE: 2.7 pg/mL (ref 2.3–4.2)

## 2017-04-04 LAB — TSH: TSH: 0.62 mIU/L

## 2017-04-04 LAB — T4, FREE: FREE T4: 1.3 ng/dL (ref 0.8–1.8)

## 2017-04-09 ENCOUNTER — Encounter (INDEPENDENT_AMBULATORY_CARE_PROVIDER_SITE_OTHER): Payer: Self-pay | Admitting: *Deleted

## 2017-04-09 DIAGNOSIS — Z30430 Encounter for insertion of intrauterine contraceptive device: Secondary | ICD-10-CM | POA: Diagnosis not present

## 2017-04-09 HISTORY — PX: INTRAUTERINE DEVICE (IUD) INSERTION: SHX5877

## 2017-05-08 ENCOUNTER — Ambulatory Visit (INDEPENDENT_AMBULATORY_CARE_PROVIDER_SITE_OTHER): Payer: 59 | Admitting: "Endocrinology

## 2017-05-08 ENCOUNTER — Encounter (INDEPENDENT_AMBULATORY_CARE_PROVIDER_SITE_OTHER): Payer: Self-pay | Admitting: "Endocrinology

## 2017-05-08 VITALS — BP 112/72 | HR 72 | Ht 67.24 in | Wt 194.8 lb

## 2017-05-08 DIAGNOSIS — E890 Postprocedural hypothyroidism: Secondary | ICD-10-CM

## 2017-05-08 DIAGNOSIS — L680 Hirsutism: Secondary | ICD-10-CM | POA: Diagnosis not present

## 2017-05-08 DIAGNOSIS — R5383 Other fatigue: Secondary | ICD-10-CM

## 2017-05-08 DIAGNOSIS — C73 Malignant neoplasm of thyroid gland: Secondary | ICD-10-CM | POA: Diagnosis not present

## 2017-05-08 NOTE — Progress Notes (Signed)
CC: FU papillary thyroid cancer, post-operative hypothyroidism, iatrogenic hyperthyroidism, fatigue, bradycardia, overweight/obesity, recent pregnancy.  A. HPI: Ms. Defibaugh is a 41 y.o. Caucasian woman who is unaccompanied today.  1. Ms. Ballowe was diagnosed with hypothyroidism, presumably secondary to Hashimoto's Disease, at some time prior to 2010. She was treated with Synthroid, 50 mcg per day. In the Summer of 2010 a goiter was noted.  A thyroid US showed multiple nodules bilaterally, with the largest being 2.6 cm in diameter. Ms. Schwanke was referred to Dr. Stark Klein, a local general surgeon, for further evaluation and management. Dr. Barry Dienes performed a fine needle aspiration, which reportedly showed Hashimoto's Disease. A follow-up thyroid US in the Summer of 2011 showed that the previously aspirated nodule and two other nodules had enlarged during the year. Dr. Barry Dienes performed a total thyroidectomy on 05/19/10. 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 The largest microfocus was in the superior right lobe, measured 4 mm in diameter, invaded the capsule, but did not extend extrathyroidally.    2. I saw the patient and her husband in consultation on 121/16/11. Clinically she showed no evidence of active thyroid cancer. I reviewed her surgical notes and pathology reports with the patient and her husband. I also discussed with them the latest clinical guidelines on the management of thyroid cancer from the [employer] Association. We discussed the options for therapy, including whether or not to give radioactive iodine therapy (RAI). Although microfocal cancers usually have a low risk of recurrence or death, her case had two factors that would potentially increase her risks. First, the fact that she had multiple foci in both lobes. Second, the fact that one microfocus did invade the thyroid capsule and might have  spread extrathyroidally, although the pathologist did not see evidence of extrathyroidal extension on the slides he reviewed.  I asked the patient to take oral contraceptives from that point until she either decided not to take RAI or until at least one year after receiving RAI. She agreed.  3.  On 06/14/10 I met with Ms. Orebaugh and her husband again and we had another full and open discussion about the advantages and disadvantages of using RAI. Since the Hennises did not yet have children, and since neither of them wanted to leave a cancer untreated when a potentially curative treatment such as RAI was available, they wanted to proceed with RAI as soon as possible. Since Ms. Wygant had not been on thyroid hormone replacement since surgery, she chose to receive RAI while undergoing thyroid hormone withdrawal and while following a strict low iodine diet. On 06/27/10, Ms. Kessenich received 50 millicuries of O-115. Her post-RAI scan showed uptake in the thyroid bed, but no spread outside the thyroid bed. As I explained to the Hennises, the I-131 uptake could have been due to the presence of some residual thyroid cells, or to some thyroid cancer cells, or both.   4. The patient has done well clinically since her I-131 therapy. She has had no evidence of recurrence on serial physical exams. Her thyroglobulin (Tg) levels have progressively decreased. On 06/01/10 the post-operative Tg level was 20.8. On 06/14/10 the pre-I-131 Tg level was 2.9. She was hypothyroid for both of these Tg values. On 09/08/10 the post-I-131 Tg level was <0.2. At that point Ms. Garinger was taking Synthroid, 175 mcg/day. Her unstimulated thyroglobulin levels have remained < 0.2 ever since.   5. Ms Bucks'  last clinic visit with me was on 01/15/17. At that visit she was 8 months pregnant. I decreased her Synthroid dose to 125 mcg/day.   A. In the interim she has been healthy. She delivered her little girl on 02/24/17. Her TFTs in October were  normal on the dose of 125 mcg/day.   B. She is feeling good. She is bottle feeding now.  C. Her allergies have been quiescent. She had "a little bit" of vertigo last Sunday. She took meclizine on Sunday. She has been diagnosed with Meniere's' disease in the past.   6. Pertinent Review of Systems: Constitutional. The patient feels "good overall". She has no significant complaints that pertain to today's visit.  Sleep: She sleeps well when she can.   Body temperature: The patient's body temperature seems to be normal overall. Weight: Weight has decreased to 194 pounds.   Eyes: The patient's vision is good. She is not aware of any eye problems. Neck: The patient is not aware of any lumps or masses in her anterior neck and thyroid bed.  There have been no significant other problems with  pressure, discomfort, or difficulty swallowing. Heart: The patient feels the expected increase in heart rate during exercise or other physical activities. There have been no significant problems with palpitations, irregular heart beats, chest pain, or chest pressure. Gastrointestinal: Bowel movements are normal. There are no significant complaints of excessive hunger, upset stomach, stomach aches or pains, diarrhea, or constipation. Musculoskeletal: Muscles and extremities appear to be working normally. There are no significant problems with hand tremor, sweaty palms, palmar erythema, or lower leg swelling. Psychological: Mood is "fine". Mental: The patient's abilities to think, to pay attention, to remember, and to make decisions are "fine".  GYN: Her LMP was 04/30/17.   PAST MEDICAL, FAMILY, AND SOCIAL HISTORY: 1. Work and family: She works full-time as a Writer at [employer]. 2. Activities: She is walking more with her husband.    3. Primary care provider: Dr. Charletta Cousin in [place] 4. OB/GYN: Dr. Aloha Gell, [facility], [phone] 5. Surgeon: Dr. Stark Klein, [facility]  3. REVIEW OF SYSTEMS: Ms. Peregrina has no significant problems related to any of her other body systems  PHYSICAL EXAM: BP 112/72   Pulse 72   Ht 5' 7.24" (1.708 m)   Wt 194 lb 12.8 oz (88.4 kg)   BMI 30.29 kg/m    Constitutional: She looks healthy, but a bit tired today. She has lost 7 pounds since her last visit.  Her affect and insight are normal.  Eyes: There is no arcus or proptosis.  Mouth: The oropharynx appears normal. The tongue appears normal. There is normal oral moisture. There is no obvious gingivitis. She has her usual mild grade I blond vellus mustache today.  Neck: There are no bruits present. The thyroid gland is absent. There continues to be some very mild residual induration of the thyroid bed over the strap muscles, more on the left side than on the right. There is no evidence of masses or nodes in the posterior neck, lateral neck, thyroid bed or supraclavicular areas. There is no tenderness to palpation. Lungs: The lungs are clear. Air movement is good. Heart: The heart rhythm and rate appear normal. Heart sounds S1 and S2 are normal. I do not appreciate any pathologic heart murmurs. Abdomen: The abdomen is enlarged. Bowel sounds are normal. The abdomen is soft and non-tender. There is no obviously palpable hepatomegaly, splenomegaly, or other masses.  Arms: Muscle mass appears appropriate for age.  Hands: There is no obvious tremor. Phalangeal and metacarpophalangeal joints appear normal. Palms are normal. Legs: Muscle mass appears appropriate for age. There is no edema.  Neurologic: Muscle strength is normal for age and gender  in both the upper and the lower extremities. Muscle tone appears normal. Sensation to touch is normal in the legs.   No flowsheet data found.   Labs 04/04/17: TSH 0.62, free T4 1.3, free T3 2.7  Labs 01/09/17: TSH 0.22, free T4 1.4, free T3 3.0, thyroglobulin <0.1, anti-thyroglobulin antibody <1  Labs 10/30/16: TSH 0.70, T4 7.6 (ref  4.5-12), T3 142 (ref 71-180); TSI <0.1 IU/L  Labs 06/27/16: TSH 5.43, free T4 1.3, free T3 3.2  Labs: 03/15/16: TSH 7.68, free T4 1.6, free T3 3.1  Labs 01/25/16: TSH 8.39, free T4 1.2, free T3 3.0  Labs 10/26/15: TSH 0.96, free T4 1.2, free T3 2.8, thyroglobulin <0.1, anti-thyroglobulin antibody <1   Labs 07/10/15: TSH 0.190, free T4 2.4, free T3 3.4  Labs 05/03/15: TSH 0.162, free T4 1.95, free T3 3.2, thyroglobulin (Tg) < 0.1, anti-Tg antibody < 1  Labs 01/22/15: TSH 0.529, free T4 1.29, free T3 2.8  Labs 12/16/14: TSH 0.811, free T4 1.00, free T3 2.4  Labs 11/05/14: TSH 0.207, free T4 1.17, free T3 2.7  Labs 08/08/13: TSH 2.325, free T4 1.08, free T3 2.9  Labs 05/30/14: Thyroglobulin < 0.1, anti-thyroglobulin antibody <1, TPO antibody 37, TSH 0.209, free T4 1.29, free T3 3.5  Labs 11/19/13: TSH 0.217, free T4 1.73, free T3 3.4, thyroglobulin <0.2, thyroglobulin antibody < 20  Labs 09/18/13: TSH 0.137, free T4 1.70, free T3 3.4  Labs 05/30/13: TSH 1.422, free T4 1.51, free T3 3.1, thyroglobulin <0.2, thyroglobulin antibody <20  Labs 02/19/13: TSH 1.734, free T4 1.35, free T3 3.2, TPO antibody 40.5, Tg antibody < 20, thyroglobulin < 0.2  Labs 10/01/12: TSH 0.312, free T4 1.66, free T3 3.0, TPO antibody 44.2, thyroglobulin < 0.2, anti-thyroglobulin antibody < 20.  Labs 10/23/11: TSH 0.214, free T4 1.54, free T3 3.2, thyroglobulin < 0.2, anti-thyroglobulin antibody < 20  Labs 08/21/11: TSH 17.234, free T4 1.73, free T3 3.3, Thyrogen-stimulated thyroglobulin (Tg) < 0.2 (undetectable). Anti-thyroglobulin antibody < 20 (normal).  Labs 04/20/11: Thyroglobulin (Tg) < 0.2 (undetectable), anti-thyroglobulin antibody (Tg Ab) < 20   Labs 04/04/11: TSH 0.154, free T4 1.57, free T3 3.1  Labs 08/19/10: Thyroglobulin <0.2. Thyroglobulin antibody <20.  ASSESSMENT: 1. Papillary thyroid cancer:   A. Her exam is normal today. There is still no clinical or lab evidence of recurrence.   B. As noted  above, her Thyrogen-stimulated thyroglobulin (Tg) levels and unstimulated Tg levels have been unmeasurable since March 2012. She has not had any chemical evidence of recurrence.   C. Today she still does not show any signs of clinical recurrence. Her Tg panel on 01/09/17 also showed no signs of recurrence.   D. At this point, 6 years after her I-131 therapy, it appears that Ms. Darden' PTC is in remission and is probably cured. She will need continuing follow-up of her PTC annually, but may need adjustment of her Synthroid dose every 6 months.   D. Although Ms. Sagar still needs Synthroid therapy, we can allow her TSH to increase to the 0.50-2.0 range.  2. Hypothyroid/hyperthyroid:   A. Her TSH had been suppressed iatrogenically since 2012, but was elevated from August 2017 to January 2018.     B. Her lab tests in January 2018  showed that she was still hypothyroid, so I increased her Synthroid dose. Her labs in May 2018 showed that she was euthyroid, at the upper end of the normal range, which was quite good for her since I anticipated that she would be having more placental metabolism of her T4 in the next two months.   C. However, her TSH in July was suppressed, so I reduced her Synthroid dose to 125 mcg/day. Her TSH in October was within the goal range of 0.50-2.0.  3. Fatigue: Her fatigue has improved since her delivery in September.   4. Bradycardia: Resolved.  5. Obesity: She has lost weight since delivery.     6. Hirsutism: Her fat cell weight has caused some increase in insulin resistance and a compensatory increase in insulin production. The hyperinsulinemia, in turn, has caused more production of testosterone from her ovaries and androstenedione from her adrenal glands. Reducing her fat cell volume should result in reducing her androgen levels.      PLAN: 1. Diagnostic: I reviewed her recent TFTs and Tg panel today. We will repeat her TFTs in January. We will then repeat the TFTs and Tg panel  in July.  2. Therapeutic: Continue the Synthroid dosage of 125 mcg daily for 7 days each week. Adjust the Synthroid doses in the future as needed.  3. Patient education: We discussed the issue of maintaining her TSH in the lower half of the normal range as a preventive measure.  4. Follow-up: Follow-up appointment in July 2019.   Level of Service: This visit lasted in excess of 50 minutes. More than 50% of the visit was devoted to counseling.  Sherrlyn Hock, MD, CDE Adult and Pediatric Endocrinology

## 2017-05-08 NOTE — Patient Instructions (Signed)
Follow up visit in July 2019. Please repeat the TFTs in January 2019. Please repeat the TFTs and thyroglobulin and thyroglobulin antibody 2 weeks prior to your next visit in July 2019.

## 2017-05-11 DIAGNOSIS — Z30431 Encounter for routine checking of intrauterine contraceptive device: Secondary | ICD-10-CM | POA: Diagnosis not present

## 2017-05-24 ENCOUNTER — Emergency Department (HOSPITAL_COMMUNITY)
Admission: EM | Admit: 2017-05-24 | Discharge: 2017-05-24 | Disposition: A | Discharge status: Home / Self Care | Payer: 59 | Attending: Emergency Medicine | Admitting: Emergency Medicine

## 2017-05-24 ENCOUNTER — Encounter (HOSPITAL_COMMUNITY): Payer: Self-pay | Admitting: *Deleted

## 2017-05-24 DIAGNOSIS — Z79899 Other long term (current) drug therapy: Secondary | ICD-10-CM | POA: Insufficient documentation

## 2017-05-24 DIAGNOSIS — H8109 Meniere's disease, unspecified ear: Secondary | ICD-10-CM | POA: Insufficient documentation

## 2017-05-24 DIAGNOSIS — Z85850 Personal history of malignant neoplasm of thyroid: Secondary | ICD-10-CM | POA: Insufficient documentation

## 2017-05-24 DIAGNOSIS — E039 Hypothyroidism, unspecified: Secondary | ICD-10-CM | POA: Insufficient documentation

## 2017-05-24 DIAGNOSIS — R42 Dizziness and giddiness: Secondary | ICD-10-CM | POA: Diagnosis not present

## 2017-05-24 LAB — CBC WITH DIFFERENTIAL/PLATELET
BASOS ABS: 0 10*3/uL (ref 0.0–0.1)
BASOS PCT: 0 %
Eosinophils Absolute: 0 10*3/uL (ref 0.0–0.7)
Eosinophils Relative: 0 %
HEMATOCRIT: 39.6 % (ref 36.0–46.0)
HEMOGLOBIN: 13.2 g/dL (ref 12.0–15.0)
LYMPHS PCT: 11 %
Lymphs Abs: 1.4 10*3/uL (ref 0.7–4.0)
MCH: 28.6 pg (ref 26.0–34.0)
MCHC: 33.3 g/dL (ref 30.0–36.0)
MCV: 85.7 fL (ref 78.0–100.0)
Monocytes Absolute: 0.7 10*3/uL (ref 0.1–1.0)
Monocytes Relative: 5 %
NEUTROS ABS: 10.3 10*3/uL — AB (ref 1.7–7.7)
NEUTROS PCT: 84 %
Platelets: 218 10*3/uL (ref 150–400)
RBC: 4.62 MIL/uL (ref 3.87–5.11)
RDW: 14.2 % (ref 11.5–15.5)
WBC: 12.4 10*3/uL — AB (ref 4.0–10.5)

## 2017-05-24 LAB — COMPREHENSIVE METABOLIC PANEL
ALBUMIN: 4.2 g/dL (ref 3.5–5.0)
ALT: 30 U/L (ref 14–54)
AST: 27 U/L (ref 15–41)
Alkaline Phosphatase: 69 U/L (ref 38–126)
Anion gap: 8 (ref 5–15)
BILIRUBIN TOTAL: 0.6 mg/dL (ref 0.3–1.2)
BUN: 9 mg/dL (ref 6–20)
CO2: 25 mmol/L (ref 22–32)
CREATININE: 0.85 mg/dL (ref 0.44–1.00)
Calcium: 9.2 mg/dL (ref 8.9–10.3)
Chloride: 104 mmol/L (ref 101–111)
GFR calc Af Amer: >60 mL/min (ref >60)
GLUCOSE: 96 mg/dL (ref 65–99)
POTASSIUM: 4 mmol/L (ref 3.5–5.1)
Sodium: 137 mmol/L (ref 135–145)
TOTAL PROTEIN: 7.4 g/dL (ref 6.5–8.1)

## 2017-05-24 MED ORDER — DIAZEPAM 5 MG PO TABS: 5.0000 mg | ORAL_TABLET | Freq: Four times a day (QID) | ORAL | 0 refills | Status: DC | PRN

## 2017-05-24 MED ORDER — ONDANSETRON 4 MG PO TBDP: ORAL_TABLET | ORAL | 0 refills | Status: DC

## 2017-05-24 MED ORDER — DIPHENHYDRAMINE HCL 50 MG/ML IJ SOLN
25.0000 mg | Freq: Once | INTRAMUSCULAR | Status: AC
  Administered 2017-05-24: 25 mg via INTRAVENOUS
  Filled 2017-05-24: qty 1

## 2017-05-24 MED ORDER — DIAZEPAM 5 MG/ML IJ SOLN
5.0000 mg | Freq: Once | INTRAMUSCULAR | Status: AC
  Administered 2017-05-24: 5 mg via INTRAVENOUS
  Filled 2017-05-24: qty 2

## 2017-05-24 MED ORDER — METOCLOPRAMIDE HCL 5 MG/ML IJ SOLN
10.0000 mg | Freq: Once | INTRAMUSCULAR | Status: AC
  Administered 2017-05-24: 10 mg via INTRAVENOUS
  Filled 2017-05-24: qty 2

## 2017-05-24 MED ORDER — SODIUM CHLORIDE 0.9 % IV BOLUS (SEPSIS)
1000.0000 mL | Freq: Once | INTRAVENOUS | Status: AC
  Administered 2017-05-24: 1000 mL via INTRAVENOUS

## 2017-05-24 NOTE — Discharge Instructions (Signed)
Use flonase daily to help with congestion and fluid in th ear.   Stay hydrated   Continue meclizine as needed for dizziness.   Take valium as needed at night for dizziness.   Take zofran if you felt nauseated or if you have vomiting.   See Dr. Lucia Gaskins, ENT, for follow up   Return to ER if you have worse dizziness, passing out, headaches, vomiting, fevers.

## 2017-05-24 NOTE — ED Provider Notes (Signed)
[facility] DEPART[facility] Provider Note   CSN: [id number] Arrival date & time: 05/24/17  1633     History   Chief Complaint Chief Complaint  Patient presents with  . Dizziness  . Nausea    HPI Betty Reyes is a 41 y.o. female history of thyroid cancer on Synthroid, recent delivery of a baby, Mnire's disease who presented with dizziness.  Patient states that she just returned from her maternity leave after delivery of a baby.  Over the last several days, patient has been having ongoing dizziness and room spinning that is worse at night.  She states that she actually had MRI in 2015 that was normal and she has been followed up with Dr. Lucia Gaskins from ENT, who diagnosed her with Meniere's disease.  She has been taking her meclizine as needed but often times vomits that up and states that if she close her eyes and room spinning improves after several hours.  She was at work today and felt dizziness and had several episodes of vomiting and hard time standing up.  She took her meclizine with no relief.  She denies any trouble speaking or focal weakness or numbness.  Patient denies history of stroke or hypertension. States that she has some headaches currently.   The history is provided by the patient.    Past Medical History:  Diagnosis Date  . Bradycardia   . Cancer ([place])   . Cervical polyp   . Fatigue   . History of radioactive iodine thyroid ablation   . Hypothyroidism, postop   . Obesity   . Postpartum care following vaginal delivery (9/6) 02/16/2015  . Thyroid cancer ([place])   . Thyroid disease    HYPOTHYROIDISM    Patient Active Problem List   Diagnosis Date Noted  . Retained placenta, delivered 02/25/2017  . Status post dilation and curettage 02/25/2017  . Hypothyroidism 02/18/2015  . Rh negative status during pregnancy 02/18/2015  . Postpartum care following vaginal delivery (9/16) 02/16/2015  . PROM (premature rupture of membranes) 02/15/2015    . Encounter for supervision of other normal pregnancy 06/22/2014  . Infertility, female 03/10/2014  . Female hirsutism 03/10/2014  . Obesity, unspecified 10/31/2012  . Iatrogenic hyperthyroidism 04/24/2012  . Obesity 11/07/2010  . Hypothyroidism, postop   . Fatigue   . Bradycardia   . 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 DILATION AND CURETTAGE OF UTERUS N/A 02/25/2017   Procedure: DILATATION AND CURETTAGE;  Surgeon: Charyl Bigger, MD;  Location: [facility];  Service: Gynecology;  Laterality: N/A;  . TOTAL THYROIDECTOMY      OB History    Gravida Para Term Preterm AB Living   2 2 2     2    SAB TAB Ectopic Multiple Live Births         0 2       Home Medications    Prior to Admission medications   Medication Sig Start Date End Date Taking? Authorizing Provider  cetirizine (ZYRTEC) 10 MG tablet Take 10 mg by mouth daily.    [provider]  ibuprofen (ADVIL,MOTRIN) 600 MG tablet Take 1 tablet (600 mg total) by mouth every 6 (six) hours. 02/26/17   Sigmon, Tyler Deis, CNM  levothyroxine (SYNTHROID, LEVOTHROID) 125 MCG tablet Take 125 mcg by mouth daily before breakfast.    [provider]  Multiple Vitamin (MULTIVITAMIN  WITH MINERALS) TABS tablet Take 1 tablet by mouth daily.    [provider]    Family History Family History  Problem Relation Age of Onset  . Cancer Mother   . Hypertension Father   . Hyperlipidemia Father   . Heart disease Father        HEART ATTACK  . Heart attack Father   . Cancer Brother   . Diabetes Maternal Grandfather   . Hypothyroidism Paternal Grandmother   . Heart attack Paternal Grandmother   . Hypertension Paternal Grandmother   . Heart attack Paternal Uncle   . Hypertension Paternal Uncle   . Heart attack Paternal Grandfather   . Hypertension Paternal Grandfather      Social History Social History   Tobacco Use  . Smoking status: Never Smoker  . Smokeless tobacco: Never Used  Substance Use Topics  . Alcohol use: No  . Drug use: No     Allergies   Patient has no known allergies.   Review of Systems Review of Systems  Neurological: Positive for dizziness.  All other systems reviewed and are negative.    Physical Exam Updated Vital Signs BP 112/73 (BP Location: Left Arm)   Pulse (!) 56   Temp 97.6 F (36.4 C) (Oral)   Resp 16   Wt 86.2 kg (190 lb)   SpO2 98%   BMI 29.54 kg/m   Physical Exam  Constitutional: She is oriented to person, place, and time.  Nauseous, uncomfortable   HENT:  Head: Normocephalic.  Mouth/Throat: Oropharynx is clear and moist.  Some fullness behind R TM, no obvious otitis media bilaterally   Eyes:  + nystagmus to the left side. No vertical nystagmus   Neck: Normal range of motion. Neck supple.  No bruit   Cardiovascular: Normal rate, regular rhythm and normal heart sounds.  Pulmonary/Chest: Effort normal and breath sounds normal. No stridor. No respiratory distress.  Abdominal: Soft. Bowel sounds are normal. She exhibits no distension. There is no tenderness.  Musculoskeletal: Normal range of motion.  Neurological: She is alert and oriented to person, place, and time.  CN 2-12 intact. Nl strength throughout. Nl sensation throughout. Nl finger to nose. Unable to ambulate due to dizziness   Skin: Skin is warm.  Psychiatric: She has a normal mood and affect.  Nursing note and vitals reviewed.    ED Treatments / Results  Labs (all labs ordered are listed, but only abnormal results are displayed) Labs Reviewed  CBC WITH DIFFERENTIAL/PLATELET - Abnormal; Notable for the following components:      Result Value   WBC 12.4 (*)    Neutro Abs 10.3 (*)    All other components within normal limits  COMPREHENSIVE METABOLIC PANEL    EKG  EKG Interpretation None       Radiology No results  found.  Procedures Procedures (including critical care time)  Medications Ordered in ED Medications  sodium chloride 0.9 % bolus 1,000 mL (0 mLs Intravenous Stopped 05/24/17 1801)  metoCLOPramide (REGLAN) injection 10 mg (10 mg Intravenous Given 05/24/17 1656)  diphenhydrAMINE (BENADRYL) injection 25 mg (25 mg Intravenous Given 05/24/17 1659)  diazepam (VALIUM) injection 5 mg (5 mg Intravenous Given 05/24/17 1653)     Initial Impression / Assessment and Plan / ED Course  I have reviewed the triage vital signs and the nursing notes.  Pertinent labs & imaging results that were available during my care of the patient were reviewed by me and considered in my medical decision making (  see chart for details).     Betty Reyes is a 41 y.o. female hx of Meniere's disease here with dizziness. Likely worsening meniere's disease. No signs of posterior circulation stroke. She has horizontal nystagmus that doesn't change with direction of gaze. Will give benzos, migraine cocktail, IVF and reassess. Will hold off on imaging for now.   6:08 PM Labs unremarkable. Able to sit and stand and patient not orthostatic. Felt better and nystagmus improved. Will dc home with valium prn and flonase and have her follow up with Dr. Lucia Gaskins from ENT.   Final Clinical Impressions(s) / ED Diagnoses   Final diagnoses:  None    ED Discharge Orders    None       Drenda Freeze, MD 05/24/17 1810

## 2017-05-24 NOTE — ED Notes (Signed)
Patient is sitting up in bed, states she is feeling better.

## 2017-05-24 NOTE — ED Triage Notes (Signed)
Pt with vertigo and nausea the past 4 hours, she was at work when this happened. Pt recently gave birth and returned to work yesterday. Took meclazine and zofran at 1300-1330. Has continued to vomit and feel dizzy. Denies recent illness

## 2017-07-05 MED FILL — SYNTHROID 125 MCG TABLET: 125 | 90 days supply | Qty: 90 | Fill #3

## 2017-08-06 DIAGNOSIS — E890 Postprocedural hypothyroidism: Secondary | ICD-10-CM | POA: Diagnosis not present

## 2017-08-07 LAB — T3, FREE: T3 FREE: 3 pg/mL (ref 2.3–4.2)

## 2017-08-07 LAB — T4, FREE: Free T4: 1.4 ng/dL (ref 0.8–1.8)

## 2017-08-07 LAB — TSH: TSH: 3.86 m[IU]/L

## 2017-08-15 ENCOUNTER — Encounter (INDEPENDENT_AMBULATORY_CARE_PROVIDER_SITE_OTHER): Payer: Self-pay | Admitting: *Deleted

## 2017-08-15 DIAGNOSIS — H8103 Meniere's disease, bilateral: Secondary | ICD-10-CM | POA: Diagnosis not present

## 2017-08-15 MED FILL — diazePAM 5 MG TABS: 5 | 10 days supply | Qty: 30 | Fill #0

## 2017-08-15 MED FILL — TRIAMTERENE/HCTZ 37.5/25 CP: 37.5-25 | 30 days supply | Qty: 30 | Fill #0

## 2017-08-15 MED FILL — ONDANSETRON ODT 8 MG TABLET: 8 | 10 days supply | Qty: 30 | Fill #0

## 2017-08-30 ENCOUNTER — Other Ambulatory Visit (INDEPENDENT_AMBULATORY_CARE_PROVIDER_SITE_OTHER): Payer: Self-pay | Admitting: "Endocrinology

## 2017-08-30 DIAGNOSIS — C73 Malignant neoplasm of thyroid gland: Secondary | ICD-10-CM

## 2017-08-30 DIAGNOSIS — E890 Postprocedural hypothyroidism: Secondary | ICD-10-CM

## 2017-08-30 NOTE — Progress Notes (Signed)
Patient needs follow up lab orders. Tillman Sers, MD, CDE

## 2017-09-03 DIAGNOSIS — H8102 Meniere's disease, left ear: Secondary | ICD-10-CM | POA: Diagnosis not present

## 2017-09-13 MED FILL — TRIAMTERENE/HCTZ 37.5/25 CP: 37.5-25 | 30 days supply | Qty: 30 | Fill #1

## 2017-09-20 ENCOUNTER — Other Ambulatory Visit: Payer: Self-pay | Admitting: "Endocrinology

## 2017-09-20 ENCOUNTER — Other Ambulatory Visit (INDEPENDENT_AMBULATORY_CARE_PROVIDER_SITE_OTHER): Payer: Self-pay | Admitting: *Deleted

## 2017-09-20 ENCOUNTER — Other Ambulatory Visit (INDEPENDENT_AMBULATORY_CARE_PROVIDER_SITE_OTHER): Payer: Self-pay | Admitting: "Endocrinology

## 2017-09-20 DIAGNOSIS — E890 Postprocedural hypothyroidism: Secondary | ICD-10-CM

## 2017-09-20 DIAGNOSIS — C73 Malignant neoplasm of thyroid gland: Secondary | ICD-10-CM

## 2017-09-20 DIAGNOSIS — E0580 Other thyrotoxicosis without thyrotoxic crisis or storm: Secondary | ICD-10-CM

## 2017-09-20 MED ORDER — LEVOTHYROXINE SODIUM 125 MCG PO TABS
125.0000 ug | ORAL_TABLET | Freq: Every day | ORAL | 1 refills | Status: DC
Start: 2017-09-20 — End: 2018-10-04

## 2017-09-20 NOTE — Telephone Encounter (Signed)
Spoke to patient and let her know Synthroid has been sent to the pharmacy and they should be contacting her shortly and let them know it is ready for pick up.

## 2017-09-20 NOTE — Telephone Encounter (Signed)
°  Who's calling (name and relationship to patient) : Gwendalynn (Self) Best contact number: [phone] Provider they see: Dr. Tobe Sos Reason for call: Pt requested refill on Synthroid.

## 2017-09-21 MED FILL — SYNTHROID 125 MCG TABLET: 125 | 90 days supply | Qty: 90 | Fill #0

## 2017-09-26 DIAGNOSIS — C73 Malignant neoplasm of thyroid gland: Secondary | ICD-10-CM | POA: Diagnosis not present

## 2017-09-26 DIAGNOSIS — E890 Postprocedural hypothyroidism: Secondary | ICD-10-CM | POA: Diagnosis not present

## 2017-09-27 LAB — T3, FREE: T3 FREE: 2.6 pg/mL (ref 2.3–4.2)

## 2017-09-27 LAB — TSH: TSH: 5.02 mIU/L — ABNORMAL HIGH

## 2017-09-27 LAB — T4, FREE: FREE T4: 1.2 ng/dL (ref 0.8–1.8)

## 2017-09-27 LAB — THYROGLOBULIN LEVEL

## 2017-09-27 LAB — THYROGLOBULIN ANTIBODY: Thyroglobulin Ab: <1 IU/mL (ref <=1)

## 2017-11-02 ENCOUNTER — Encounter: Payer: Self-pay | Admitting: Family Medicine

## 2017-11-02 ENCOUNTER — Ambulatory Visit (INDEPENDENT_AMBULATORY_CARE_PROVIDER_SITE_OTHER): Payer: 59 | Admitting: Family Medicine

## 2017-11-02 VITALS — BP 110/80 | HR 62 | Ht 67.25 in | Wt 192.4 lb

## 2017-11-02 DIAGNOSIS — H8103 Meniere's disease, bilateral: Secondary | ICD-10-CM

## 2017-11-02 DIAGNOSIS — Z0000 Encounter for general adult medical examination without abnormal findings: Secondary | ICD-10-CM | POA: Diagnosis not present

## 2017-11-02 DIAGNOSIS — Z13220 Encounter for screening for lipoid disorders: Secondary | ICD-10-CM

## 2017-11-02 NOTE — Assessment & Plan Note (Signed)
Well adult Will continue to follow with endocrinology for management of thyroid  Immunizations: up to date Labs:  Orders entered, will have drawn at Endo office Anticipatory guidance/Risk factor reduction:  See AVS.

## 2017-11-02 NOTE — Patient Instructions (Addendum)
It was great to see you today! Please contact us if you need refills of medication for vertigo  Adult Wellness Guidelines   Adult Health - for Ages 81 and Over Preventive care is very important for adults. By making some good basic health choices, women and men can boost their own health and well-being. Some of these positive choices include:   Eat a healthy diet  Get regular exercise  Don't use tobacco products  Limit alcohol use  Strive for a healthy weight  Adult Recommendations Screenings Physical Exam Every year, or as directed by your doctor. Body Mass Index (BMI) Every year. Blood Pressure (BP) At least every two years. Colon Cancer Screening Beginning at age 15 - colonoscopy every 10 years, or flexible sigmoidoscopy every five years or fecal blood test annually. Diabetes Screening Those with high blood pressure or high cholesterol should be screened. Others, especially those who are overweight or have a close family history of diabetes, should consider being screened every three years. Vision Screening Every year.  Immunizations Tetanus, Diphtheria, Pertussis (Td/ Tdap) Get Tdap vaccine once, then a Td booster every 10 years. Influenza (Flu) Every year. Herpes Zoster (Shingles) One dose given at age 23 and over. Varicella (Chicken Pox) Two doses if no evidence of immunity. Pneumococcal (Pneumonia) One or two doses for adults age 69 and older, or one or two doses depending on indication. Measles, Mumps, Rubella (MMR) One or two doses for adults ages 77-55 if no evidence of immunity. Human Papillomavirus (HPV) Three doses for women ages 19-26 if not already given. Three doses for men ages 19-21 if not already given.* Hepatitis A Two or three doses for adults age 40 and over.** Hepatitis B Three doses for ages 30 and over.** * Recommendations may vary. Discuss the start and frequency of screenings with your doctor, especially if you are at increased risk. ** For select  populations. Discuss with your doctor if this vaccine is right for you.  Women's Health Women have their own unique health care needs. To stay well, they should make regular screenings a priority. Women should discuss the recommendations listed on the chart with their doctors. Women's Recommendations Mammogram Every year for women beginning at age 57.* Cholesterol Starting age and frequency of screenings are based on your individual risk factors. Talk with your doctor about what is best for you. Pap Test Women ages 21-65: Pap test every three years. Another option for ages 52-65: Pap test and HPV test every five years. Women who have had a hysterectomy or are over age 44 may not need a Pap test.* Osteoporosis Screening Beginning at age 75, or at age 44 if risk factors are present.* Aspirin Use At ages 53-79, talk with your doctor about the benefits and risks of aspirin use. Pelvic Exam Every year for ages 26 and over. Folic Acid Women planning/capable of pregnancy should take a daily supplement containing .4-.8 mg of folic acid for prevention of neural tube defects.  * Recommendations may vary. Discuss screening options with your doctor, especially if you are at increased risk. Sources: [place] Department of Health and Financial controller and the Centers for Disease Control and Prevention, U.S. Preventive Services Task Force   Men's Health Recommendations Men are encouraged to get care as needed and make smart choices. That includes following a healthy lifestyle and getting recommended preventive care services.  Recommended preventative care services are as follows:  Cholesterol Ages 20-35 should be tested if at high risk. Men  age 7 and over should be tested. Prostate Cancer Screening Ages 33 and over, discuss the benefits and risks of screening with your doctor.* Abdominal Aortic Aneurysm Once between ages 42 and 23 if you have ever smoked.

## 2017-11-02 NOTE — Assessment & Plan Note (Signed)
Stable with current medications, she will let us know if she needs refills.

## 2017-11-02 NOTE — Progress Notes (Signed)
Betty Reyes - 42 y.o. female MRN [id number]  Date of birth: 16-Jun-1975  Subjective Chief Complaint  Patient presents with  . Annual Exam    HPI  Betty Reyes is a 42 y.o. female with history of thyroid cancer s/p thyroidectomy and meniere's/vertigo here today to establish care with new pcp and for annual CPE.   She is 8 months postpartum and doing well.  She is followed by an endocrinologist who is managing her hypothyroidism.  She also has seen ENT for meniere's.  She thinks that she would prefer have me manage medication for her meniere's/vertigo as she has been stable on current medication and frequency of symptoms has diminished since addition of dyazide.    Review of Systems  Constitutional: Negative for chills, fever, malaise/fatigue and weight loss.  HENT: Negative for congestion, ear pain and sore throat.   Eyes: Negative for blurred vision, double vision and pain.  Respiratory: Negative for cough and shortness of breath.   Cardiovascular: Negative for chest pain and palpitations.  Gastrointestinal: Negative for abdominal pain, blood in stool, constipation, heartburn and nausea.  Genitourinary: Negative for dysuria and urgency.  Musculoskeletal: Negative for joint pain and myalgias.  Neurological: Negative for dizziness and headaches.  Endo/Heme/Allergies: Does not bruise/bleed easily.  Psychiatric/Behavioral: Negative for depression. The patient is not nervous/anxious and does not have insomnia.      No Known Allergies  Past Medical History:  Diagnosis Date  . Bradycardia   . Cancer ([place])   . Cervical polyp   . Fatigue   . History of radioactive iodine thyroid ablation   . Hypothyroidism, postop   . Obesity   . Postpartum care following vaginal delivery (9/6) 02/16/2015  . Thyroid cancer ([place])   . Thyroid disease    HYPOTHYROIDISM    Past Surgical History:  Procedure Laterality Date  . DILATION AND CURETTAGE OF UTERUS N/A 02/25/2017   Procedure: DILATATION  AND CURETTAGE;  Surgeon: Charyl Bigger, MD;  Location: [place];  Service: Gynecology;  Laterality: N/A;  . TOTAL THYROIDECTOMY      Social History   Socioeconomic History  . Marital status: Married    Spouse name: Not on file  . Number of children: Not on file  . Years of education: Not on file  . Highest education level: Not on file  Occupational History  . Not on file  Social Needs  . Financial resource strain: Not on file  . Food insecurity:    Worry: Not on file    Inability: Not on file  . Transportation needs:    Medical: Not on file    Non-medical: Not on file  Tobacco Use  . Smoking status: Never Smoker  . Smokeless tobacco: Never Used  Substance and Sexual Activity  . Alcohol use: No  . Drug use: No  . Sexual activity: Yes    Partners: Male  Lifestyle  . Physical activity:    Days per week: Not on file    Minutes per session: Not on file  . Stress: Not on file  Relationships  . Social connections:    Talks on phone: Not on file    Gets together: Not on file    Attends religious service: Not on file    Active member of club or organization: Not on file    Attends meetings of clubs or organizations: Not on file    Relationship status: Not on file  Other Topics Concern  . 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    Family History  Problem Relation Age of Onset  . Cancer Mother   . Hypertension Father   . Hyperlipidemia Father   . Heart disease Father        HEART ATTACK  . Heart attack Father   . Cancer Brother   . Diabetes Maternal Grandfather   . Hypothyroidism Paternal Grandmother   . Heart attack Paternal Grandmother   . Hypertension Paternal Grandmother   . Heart attack Paternal Uncle   . Hypertension Paternal Uncle   . Heart attack Paternal Grandfather   . Hypertension Paternal Grandfather     Health Maintenance  Topic Date Due    . INFLUENZA VACCINE  01/10/2018  . PAP SMEAR  06/13/2019  . TETANUS/TDAP  06/12/2026  . HIV Screening  Completed    ----------------------------------------------------------------------------------------------------------------------------------------------------------------------------------------------------------------- Physical Exam BP 110/80 (BP Location: Left Arm, Patient Position: Sitting, Cuff Size: Normal)   Pulse 62   Ht 5' 7.25" (1.708 m)   Wt 192 lb 6.4 oz (87.3 kg)   Breastfeeding? No   BMI 29.91 kg/m   Physical Exam  Constitutional: She is oriented to person, place, and time. She appears well-nourished. No distress.  HENT:  Head: Normocephalic and atraumatic.  Mouth/Throat: Oropharynx is clear and moist.  Eyes: Conjunctivae are normal. No scleral icterus.  Neck: Neck supple. No thyromegaly present.  Cardiovascular: Normal rate, regular rhythm, normal heart sounds and intact distal pulses.  Pulmonary/Chest: Effort normal and breath sounds normal.  Abdominal: Soft. Bowel sounds are normal. She exhibits no distension. There is no tenderness.  Neurological: She is alert and oriented to person, place, and time.  Psychiatric: She has a normal mood and affect. Her behavior is normal.    ------------------------------------------------------------------------------------------------------------------------------------------------------------------------------------------------------------------- Assessment and Plan  Well adult exam Well adult Will continue to follow with endocrinology for management of thyroid  Immunizations: up to date Labs:  Orders entered, will have drawn at Endo office Anticipatory guidance/Risk factor reduction:  See AVS.   Meniere's disease of both ears Stable with current medications, she will let us know if she needs refills.

## 2017-11-26 MED FILL — TRIAMTERENE/HCTZ 37.5/25 CP: 37.5-25 | 30 days supply | Qty: 30 | Fill #2

## 2018-01-07 ENCOUNTER — Encounter (INDEPENDENT_AMBULATORY_CARE_PROVIDER_SITE_OTHER): Payer: Self-pay | Admitting: "Endocrinology

## 2018-01-07 ENCOUNTER — Ambulatory Visit (INDEPENDENT_AMBULATORY_CARE_PROVIDER_SITE_OTHER): Payer: 59 | Admitting: "Endocrinology

## 2018-01-07 ENCOUNTER — Other Ambulatory Visit (INDEPENDENT_AMBULATORY_CARE_PROVIDER_SITE_OTHER): Payer: Self-pay | Admitting: "Endocrinology

## 2018-01-07 ENCOUNTER — Other Ambulatory Visit: Payer: Self-pay

## 2018-01-07 VITALS — BP 118/70 | HR 88 | Ht 67.72 in | Wt 190.0 lb

## 2018-01-07 DIAGNOSIS — E890 Postprocedural hypothyroidism: Secondary | ICD-10-CM

## 2018-01-07 DIAGNOSIS — R5383 Other fatigue: Secondary | ICD-10-CM

## 2018-01-07 DIAGNOSIS — Z0000 Encounter for general adult medical examination without abnormal findings: Secondary | ICD-10-CM | POA: Diagnosis not present

## 2018-01-07 DIAGNOSIS — E6609 Other obesity due to excess calories: Secondary | ICD-10-CM | POA: Diagnosis not present

## 2018-01-07 DIAGNOSIS — Z13220 Encounter for screening for lipoid disorders: Secondary | ICD-10-CM

## 2018-01-07 DIAGNOSIS — C73 Malignant neoplasm of thyroid gland: Secondary | ICD-10-CM | POA: Diagnosis not present

## 2018-01-07 LAB — T3, FREE: T3, Free: 3 pg/mL (ref 2.3–4.2)

## 2018-01-07 LAB — TSH: TSH: 0.81 m[IU]/L

## 2018-01-07 LAB — LIPID PANEL
Cholesterol: 170 mg/dL (ref <200)
HDL: 43 mg/dL — AB (ref >50)
LDL Cholesterol (Calc): 109 mg/dL (calc) — ABNORMAL HIGH
Non-HDL Cholesterol (Calc): 127 mg/dL (calc) (ref <130)
Total CHOL/HDL Ratio: 4 (calc) (ref <5.0)
Triglycerides: 88 mg/dL (ref <150)

## 2018-01-07 LAB — BASIC METABOLIC PANEL
BUN: 11 mg/dL (ref 7–25)
CHLORIDE: 104 mmol/L (ref 98–110)
CO2: 25 mmol/L (ref 20–32)
Calcium: 9.2 mg/dL (ref 8.6–10.2)
Creat: 0.75 mg/dL (ref 0.50–1.10)
Glucose, Bld: 84 mg/dL (ref 65–99)
Potassium: 4.1 mmol/L (ref 3.5–5.3)
SODIUM: 138 mmol/L (ref 135–146)

## 2018-01-07 LAB — T4, FREE: Free T4: 1.6 ng/dL (ref 0.8–1.8)

## 2018-01-07 LAB — CBC
HCT: 40.9 % (ref 35.0–45.0)
HEMOGLOBIN: 13.8 g/dL (ref 11.7–15.5)
MCH: 29.9 pg (ref 27.0–33.0)
MCHC: 33.7 g/dL (ref 32.0–36.0)
MCV: 88.5 fL (ref 80.0–100.0)
MPV: 11.8 fL (ref 7.5–12.5)
Platelets: 182 10*3/uL (ref 140–400)
RBC: 4.62 10*6/uL (ref 3.80–5.10)
RDW: 13.1 % (ref 11.0–15.0)
WBC: 7.7 10*3/uL (ref 3.8–10.8)

## 2018-01-07 MED FILL — SYNTHROID 125 MCG TABLET: 125 | 90 days supply | Qty: 90 | Fill #1

## 2018-01-07 MED FILL — TRIAMTERENE/HCTZ 37.5/25 CP: 37.5-25 | 30 days supply | Qty: 30 | Fill #3

## 2018-01-07 NOTE — Addendum Note (Signed)
Addended by: Lynnea Ferrier on: 01/07/2018 09:44 AM   Modules accepted: Orders

## 2018-01-07 NOTE — Addendum Note (Signed)
Addended by: Lynnea Ferrier on: 01/07/2018 09:45 AM   Modules accepted: Orders

## 2018-01-07 NOTE — Progress Notes (Signed)
CC: FU papillary thyroid cancer, post-operative hypothyroidism, iatrogenic hyperthyroidism, fatigue, bradycardia, overweight/obesity  A. HPI: Ms. Habermann is a 42 y.o. Caucasian woman who is unaccompanied today.  1. Ms. Asante was diagnosed with hypothyroidism, presumably secondary to Hashimoto's Disease, at some time prior to 2010. She was treated with Synthroid, 50 mcg per day. In the Summer of 2010 a goiter was noted.  A thyroid US showed multiple nodules bilaterally, with the largest being 2.6 cm in diameter. Ms. Rostad was referred to Dr. Stark Klein, a local general surgeon, for further evaluation and management. Dr. Barry Dienes performed a fine needle aspiration, which reportedly showed Hashimoto's Disease. A follow-up thyroid US in the Summer of 2011 showed that the previously aspirated nodule and two other nodules had enlarged during the year. Dr. Barry Dienes performed a total thyroidectomy on 05/19/10. 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 The largest microfocus was in the superior right lobe, measured 4 mm in diameter, invaded the capsule, but did not extend extrathyroidally.    2. I saw the patient and her husband in consultation on 121/16/11. Clinically she showed no evidence of active thyroid cancer. I reviewed her surgical notes and pathology reports with the patient and her husband. I also discussed with them the latest clinical guidelines on the management of thyroid cancer from the American Thyroid Association. We discussed the options for therapy, including whether or not to give radioactive iodine therapy (RAI). Although microfocal cancers usually have a low risk of recurrence or death, her case had two factors that would potentially increase her risks. First, the fact that she had multiple foci in both lobes. Second, the fact that one microfocus did invade the thyroid capsule and might have spread extrathyroidally,  although the pathologist did not see evidence of extrathyroidal extension on the slides he reviewed.  I asked the patient to take oral contraceptives from that point until she either decided not to take RAI or until at least one year after receiving RAI. She agreed.  3.  On 06/14/10 I met with Ms. Cerasoli and her husband again and we had another full and open discussion about the advantages and disadvantages of using RAI. Since the Hennises did not yet have children, and since neither of them wanted to leave a cancer untreated when a potentially curative treatment such as RAI was available, they wanted to proceed with RAI as soon as possible. Since Ms. Schoch had not been on thyroid hormone replacement since surgery, she chose to receive RAI while undergoing thyroid hormone withdrawal and while following a strict low iodine diet. On 06/27/10, Ms. Hitsman received 50 millicuries of Z-610. Her post-RAI scan showed uptake in the thyroid bed, but no spread outside the thyroid bed. As I explained to the Hennises, the I-131 uptake could have been due to the presence of some residual thyroid cells, or to some thyroid cancer cells, or both.   4. The patient has done well clinically since her I-131 therapy. She has had no evidence of recurrence on serial physical exams. Her thyroglobulin (Tg) levels have progressively decreased. On 06/01/10 the post-operative Tg level was 20.8. On 06/14/10 the pre-I-131 Tg level was 2.9. She was hypothyroid for both of these Tg values. On 09/08/10 the post-I-131 Tg level was <0.2. At that point Ms. Righter was taking Synthroid, 175 mcg/day. Her unstimulated thyroglobulin levels have remained < 0.2 ever since.   5. Ms Swamy' last clinic  visit with me was on 05/08/17. At that visit I continued her Synthroid dose of 125 mcg/day.   A. In the interim she has been healthy.  Her TFTs in A[place]il 2019 were low, so I increased her Synthroid to 125 mcg/day for 6 days each week, but 250 mcg/day on  Sundays.   B. She is feeling good. She is bottle feeding now.  C. Her allergies have been quiescent since abpout March 2019. She had "a little bit" of vertigo when her allergies were acting up. She took meclizine, but added Valium if the meclizine did not work. She occasionally feels "buzzing" in her ears. She has been diagnosed with Meniere's' disease in the past.   D.She has been walking more with the kids and swimming a lot this Summer.   6. Pertinent Review of Systems: Constitutional. The patient feels "good". She has no significant complaints that pertain to today's visit.  Sleep: She sleeps well when she can.   Body temperature: The patient's body temperature seems to be normal overall. Weight: Weight has decreased to 190 pounds.   Eyes: The patient's vision is good. She is not aware of any eye problems. Neck: The patient is not aware of any lumps or masses in her anterior neck and thyroid bed.  There have been no significant other problems with  pressure, discomfort, or difficulty swallowing. Heart: The patient feels the expected increase in heart rate during exercise or other physical activities. There have been no significant problems with palpitations, irregular heart beats, chest pain, or chest pressure. Gastrointestinal: Bowel movements are normal. There are no significant complaints of excessive hunger, upset stomach, stomach aches or pains, diarrhea, or constipation. Musculoskeletal: Muscles and extremities appear to be working normally. There are no significant problems with hand tremor, sweaty palms, palmar erythema, or lower leg swelling. Psychological: Mood is "fine". Mental: The patient's abilities to think, to pay attention, to remember, and to make decisions are "fine".  GYN: She had an IUD inserted in November 2018. She has had some spotting since then, but no real periods.[name]   PAST MEDICAL, FAMILY, AND SOCIAL HISTORY: 1. Work and family: She works full-time as a Health visitor at [employer] 2. Activities: She is walking more with her husband and swimming..    3. Primary care provider: Dr. Luetta Nutting at [facility] [place].  4. OB/GYN: Dr. Aloha Gell, [facility], [phone] 5. Surgeon: Dr. Stark Klein, [facility]  3. REVIEW OF SYSTEMS: Ms. Nawabi has no significant problems related to any of her other body systems  PHYSICAL EXAM: BP 118/70   Pulse 88   Ht 5' 7.72" (1.72 m)   Wt 190 lb (86.2 kg)   BMI 29.13 kg/m    Constitutional: She looks healthy, but a bit tired today. She has lost 4 pounds since her last visit.  Her affect and insight are normal.  Eyes: There is no arcus or proptosis.  Mouth: The oropharynx appears normal. The tongue appears normal. There is normal oral moisture. There is no obvious gingivitis. She has her usual mild grade I blond vellus mustache today.  Neck: There are no bruits present. The thyroid gland is absent. There continues to be some very mild residual induration of the thyroid bed over the strap muscles, more on the left side than on the right. There is no evidence of masses or nodes in the posterior neck, lateral neck, thyroid bed or supraclavicular areas. There is no tenderness to palpation. Lungs: The  lungs are clear. Air movement is good. Heart: The heart rhythm and rate appear normal. Heart sounds S1 and S2 are normal. I do not appreciate any pathologic heart murmurs. Abdomen: The abdomen is enlarged, but smaller. Bowel sounds are normal. The abdomen is soft and non-tender. There is no obviously palpable hepatomegaly, splenomegaly, or other masses.  Arms: Muscle mass appears appropriate for age.  Hands: There is no obvious tremor. Phalangeal and metacarpophalangeal joints appear normal. Palms are normal. Legs: Muscle mass appears appropriate for age. There is no edema.  Neurologic: Muscle strength is normal for age and gender  in both the upper and the lower extremities. Muscle tone  appears normal. Sensation to touch is normal in the legs.   No flowsheet data found.   Labs 09/26/17: TSH 5.02, free T4 1.2, free T3 2.6, thyroglobulin <0.1, thyroglobulin antibody <1  Labs 04/04/17: TSH 0.62, free T4 1.3, free T3 2.7  Labs 01/09/17: TSH 0.22, free T4 1.4, free T3 3.0, thyroglobulin <0.1, anti-thyroglobulin antibody <1  Labs 10/30/16: TSH 0.70, T4 7.6 (ref 4.5-12), T3 142 (ref 71-180); TSI <0.1 IU/L  Labs 06/27/16: TSH 5.43, free T4 1.3, free T3 3.2  Labs: 03/15/16: TSH 7.68, free T4 1.6, free T3 3.1  Labs 01/25/16: TSH 8.39, free T4 1.2, free T3 3.0  Labs 10/26/15: TSH 0.96, free T4 1.2, free T3 2.8, thyroglobulin <0.1, anti-thyroglobulin antibody <1   Labs 07/10/15: TSH 0.190, free T4 2.4, free T3 3.4  Labs 05/03/15: TSH 0.162, free T4 1.95, free T3 3.2, thyroglobulin (Tg) < 0.1, anti-Tg antibody < 1  Labs 01/22/15: TSH 0.529, free T4 1.29, free T3 2.8  Labs 12/16/14: TSH 0.811, free T4 1.00, free T3 2.4  Labs 11/05/14: TSH 0.207, free T4 1.17, free T3 2.7  Labs 08/08/13: TSH 2.325, free T4 1.08, free T3 2.9  Labs 05/30/14: Thyroglobulin < 0.1, anti-thyroglobulin antibody <1, TPO antibody 37, TSH 0.209, free T4 1.29, free T3 3.5  Labs 11/19/13: TSH 0.217, free T4 1.73, free T3 3.4, thyroglobulin <0.2, thyroglobulin antibody < 20  Labs 09/18/13: TSH 0.137, free T4 1.70, free T3 3.4  Labs 05/30/13: TSH 1.422, free T4 1.51, free T3 3.1, thyroglobulin <0.2, thyroglobulin antibody <20  Labs 02/19/13: TSH 1.734, free T4 1.35, free T3 3.2, TPO antibody 40.5, Tg antibody < 20, thyroglobulin < 0.2  Labs 10/01/12: TSH 0.312, free T4 1.66, free T3 3.0, TPO antibody 44.2, thyroglobulin < 0.2, anti-thyroglobulin antibody < 20.  Labs 10/23/11: TSH 0.214, free T4 1.54, free T3 3.2, thyroglobulin < 0.2, anti-thyroglobulin antibody < 20  Labs 08/21/11: TSH 17.234, free T4 1.73, free T3 3.3, Thyrogen-stimulated thyroglobulin (Tg) < 0.2 (undetectable). Anti-thyroglobulin antibody < 20  (normal).  Labs 04/20/11: Thyroglobulin (Tg) < 0.2 (undetectable), anti-thyroglobulin antibody (Tg Ab) < 20   Labs 04/04/11: TSH 0.154, free T4 1.57, free T3 3.1  Labs 08/19/10: Thyroglobulin <0.2. Thyroglobulin antibody <20.  ASSESSMENT: 1. Papillary thyroid cancer:   A. Her exam is normal today. There is still no clinical or lab evidence of recurrence.   B. As noted above, her Thyrogen-stimulated thyroglobulin (Tg) levels and unstimulated Tg levels have been unmeasurable since March 2012. She has not had any chemical evidence of recurrence.   C. Today she still does not show any signs of clinical recurrence. Her Tg panel on 09/26/17 also showed no signs of recurrence.   D. At this point, 7 years after her I-131 therapy, it appears that Ms. Kovaleski' PTC is in remission and is probably cured. She  will need continuing follow-up of her PTC annually, but may need adjustment of her Synthroid dose every 6 months.   D. Although Ms. Anzaldo still needs Synthroid therapy, we can allow her TSH to increase to the 0.50-2.0 range.  2. Hypothyroid/hyperthyroid:   A. Her TSH had been suppressed iatrogenically since 2012, but has intermittenlty been elevated, sometimes for unknown reasons.   B. Her TSH in April 2019 was elevated, so I increased her Synthroid dose.  3. Fatigue: Her fatigue has improved since her delivery in September 2018. 4. Bradycardia: Resolved.  5. Obesity: She has lost weight since delivery.     6. Hirsutism: Her fat cell weight has caused some increase in insulin resistance and a compensatory increase in insulin production. The hyperinsulinemia, in turn, has caused more production of testosterone from her ovaries and androstenedione from her adrenal glands. Reducing her fat cell volume should result in reducing her androgen levels.      PLAN: 1. Diagnostic: I reviewed her recent TFTs and Tg panel results today. We will repeat her TFTs now and again in January 2020.[name] We will repeat the Tg  panel in July 2020.  2. Therapeutic: Continue the Synthroid dosage of 125 mcg daily for 6 days each week, but take 250 mcg/cday on Sundays. Adjust the Synthroid doses in the future as needed.  3. Patient education: We discussed the issue of maintaining her TSH in the lower half of the normal range as a preventive measure.  4. Follow-up: Follow-up appointment in January 2020  Level of Service: This visit lasted in excess of 50 minutes. More than 50% of the visit was devoted to counseling.  Sherrlyn Hock, MD, CDE Adult and Pediatric Endocrinology

## 2018-01-07 NOTE — Patient Instructions (Signed)
Follow p visit in 6 months.

## 2018-01-10 ENCOUNTER — Encounter (INDEPENDENT_AMBULATORY_CARE_PROVIDER_SITE_OTHER): Payer: Self-pay | Admitting: *Deleted

## 2018-01-11 NOTE — Progress Notes (Signed)
-  LDL is mildly elevated.  Low fat diet with regular exercise recommended.  No medication needed at this time.   -Other labs are normal.

## 2018-02-04 ENCOUNTER — Emergency Department (HOSPITAL_COMMUNITY)
Admission: EM | Admit: 2018-02-04 | Discharge: 2018-02-04 | Disposition: A | Discharge status: Home / Self Care | Payer: 59 | Attending: Emergency Medicine | Admitting: Emergency Medicine

## 2018-02-04 ENCOUNTER — Emergency Department (HOSPITAL_COMMUNITY): Payer: 59

## 2018-02-04 ENCOUNTER — Encounter (HOSPITAL_COMMUNITY): Payer: Self-pay | Admitting: Emergency Medicine

## 2018-02-04 DIAGNOSIS — Y999 Unspecified external cause status: Secondary | ICD-10-CM | POA: Insufficient documentation

## 2018-02-04 DIAGNOSIS — Y93E1 Activity, personal bathing and showering: Secondary | ICD-10-CM | POA: Insufficient documentation

## 2018-02-04 DIAGNOSIS — Z85850 Personal history of malignant neoplasm of thyroid: Secondary | ICD-10-CM | POA: Insufficient documentation

## 2018-02-04 DIAGNOSIS — W010XXA Fall on same level from slipping, tripping and stumbling without subsequent striking against object, initial encounter: Secondary | ICD-10-CM | POA: Insufficient documentation

## 2018-02-04 DIAGNOSIS — S01511A Laceration without foreign body of lip, initial encounter: Secondary | ICD-10-CM | POA: Diagnosis not present

## 2018-02-04 DIAGNOSIS — Z79899 Other long term (current) drug therapy: Secondary | ICD-10-CM | POA: Insufficient documentation

## 2018-02-04 DIAGNOSIS — R55 Syncope and collapse: Secondary | ICD-10-CM | POA: Diagnosis not present

## 2018-02-04 DIAGNOSIS — R410 Disorientation, unspecified: Secondary | ICD-10-CM | POA: Diagnosis not present

## 2018-02-04 DIAGNOSIS — H8109 Meniere's disease, unspecified ear: Secondary | ICD-10-CM | POA: Diagnosis not present

## 2018-02-04 DIAGNOSIS — S0083XA Contusion of other part of head, initial encounter: Secondary | ICD-10-CM

## 2018-02-04 DIAGNOSIS — S069X9A Unspecified intracranial injury with loss of consciousness of unspecified duration, initial encounter: Secondary | ICD-10-CM | POA: Diagnosis not present

## 2018-02-04 DIAGNOSIS — Y92002 Bathroom of unspecified non-institutional (private) residence single-family (private) house as the place of occurrence of the external cause: Secondary | ICD-10-CM | POA: Diagnosis not present

## 2018-02-04 DIAGNOSIS — S0990XA Unspecified injury of head, initial encounter: Secondary | ICD-10-CM | POA: Diagnosis present

## 2018-02-04 DIAGNOSIS — E039 Hypothyroidism, unspecified: Secondary | ICD-10-CM | POA: Insufficient documentation

## 2018-02-04 DIAGNOSIS — W19XXXA Unspecified fall, initial encounter: Secondary | ICD-10-CM | POA: Diagnosis not present

## 2018-02-04 DIAGNOSIS — R42 Dizziness and giddiness: Secondary | ICD-10-CM | POA: Diagnosis not present

## 2018-02-04 DIAGNOSIS — S0993XA Unspecified injury of face, initial encounter: Secondary | ICD-10-CM | POA: Diagnosis not present

## 2018-02-04 LAB — BASIC METABOLIC PANEL
ANION GAP: 12 (ref 5–15)
BUN: 7 mg/dL (ref 6–20)
CO2: 26 mmol/L (ref 22–32)
Calcium: 9.7 mg/dL (ref 8.9–10.3)
Chloride: 100 mmol/L (ref 98–111)
Creatinine, Ser: 0.92 mg/dL (ref 0.44–1.00)
GFR calc Af Amer: >60 mL/min (ref >60)
GFR calc non Af Amer: >60 mL/min (ref >60)
GLUCOSE: 87 mg/dL (ref 70–99)
Potassium: 3.7 mmol/L (ref 3.5–5.1)
Sodium: 138 mmol/L (ref 135–145)

## 2018-02-04 LAB — I-STAT BETA HCG BLOOD, ED (MC, WL, AP ONLY): I-stat hCG, quantitative: <5 m[IU]/mL (ref <5)

## 2018-02-04 LAB — CBC
HCT: 44.9 % (ref 36.0–46.0)
Hemoglobin: 14.4 g/dL (ref 12.0–15.0)
MCH: 28.9 pg (ref 26.0–34.0)
MCHC: 32.1 g/dL (ref 30.0–36.0)
MCV: 90.2 fL (ref 78.0–100.0)
Platelets: 232 10*3/uL (ref 150–400)
RBC: 4.98 MIL/uL (ref 3.87–5.11)
RDW: 13.7 % (ref 11.5–15.5)
WBC: 14.9 10*3/uL — AB (ref 4.0–10.5)

## 2018-02-04 MED ORDER — LIDOCAINE HCL (PF) 1 % IJ SOLN
10.0000 mL | Freq: Once | INTRAMUSCULAR | Status: AC
  Administered 2018-02-04: 10 mL
  Filled 2018-02-04: qty 10

## 2018-02-04 MED ORDER — ACETAMINOPHEN 325 MG PO TABS
650.0000 mg | ORAL_TABLET | Freq: Once | ORAL | Status: AC
  Administered 2018-02-04: 650 mg via ORAL
  Filled 2018-02-04: qty 2

## 2018-02-04 MED ORDER — SODIUM CHLORIDE 0.9 % IV BOLUS
1000.0000 mL | Freq: Once | INTRAVENOUS | Status: AC
  Administered 2018-02-04: 1000 mL via INTRAVENOUS

## 2018-02-04 NOTE — ED Notes (Signed)
Pt transported to CT ?

## 2018-02-04 NOTE — ED Triage Notes (Addendum)
Pt arrives by [facility] after having a period of loss of consciousness. Pt reports yesterday at 4pm she was dizzy consistent with her vertigo. Pt reports she was also vomiting due to dizziness. Pt reports taking Antivert along with Valium and had no relief. Pt got into shower this am and husband heard a loud noise and found pt lying outside of the shower unconsciousness for a "few minutes". Pt is alert and ox 4 now but doesn't remember event.

## 2018-02-04 NOTE — Discharge Instructions (Signed)
Please read and follow all provided instructions.  Your diagnoses today include:  1. Syncope, unspecified syncope type   2. Complicated laceration of lip, initial encounter   3. Facial contusion, initial encounter     Tests performed today include:  CT of your face and head - no serious injuries or fractures  Blood counts and electrolytes -appear normal  EKG -no arrhythmias or other problems  Vital signs. See below for your results today.   Medications prescribed:   None  Take any prescribed medications only as directed.   Home care instructions:  Follow any educational materials and wound care instructions contained in this packet.   Keep affected area above the level of your heart when possible to minimize swelling. Wash area gently twice a day with warm soapy water. Do not apply alcohol or hydrogen peroxide. Cover the area if it draining or weeping.   Follow-up instructions: Suture Removal: Return to the Emergency Department or see your primary care care doctor in 5-7 days for a recheck of your wound and removal of your sutures or staples.    Return instructions:  Return to the Emergency Department if you have:  Fever  Worsening pain  Worsening swelling of the wound  Pus draining from the wound  Redness of the skin that moves away from the wound, especially if it streaks away from the affected area   Any other emergent concerns  Your vital signs today were: BP 109/68    Pulse 68    Temp 98 F (36.7 C) (Oral)    Resp 18    LMP  (LMP Unknown)    SpO2 95%  If your blood pressure (BP) was elevated above 135/85 this visit, please have this repeated by your doctor within one month. --------------

## 2018-02-04 NOTE — ED Notes (Signed)
Ambulated to the bathroom. Pt reported no dizziness.

## 2018-02-04 NOTE — ED Notes (Signed)
ED Provider at bedside. 

## 2018-02-04 NOTE — ED Notes (Signed)
Pt stable, ambulatory, states understanding of discharge instructions 

## 2018-02-04 NOTE — ED Provider Notes (Signed)
[facility] EMERGENCY DEPARTMENT Provider Note   CSN: [id number] Arrival date & time: 02/04/18  6503     History   Chief Complaint Chief Complaint  Patient presents with  . Loss of Consciousness    HPI Betty Reyes is a 42 y.o. female.  Patient with history of Mnire's disease, frequent vertigo, history of thyroid cancer --presents to the emergency department with facial laceration sustained after a syncopal episode and fall this morning.  Patient reports having difficulty controlling her Mnire's symptoms over the past 1 week.  Yesterday she vomited frequently between 4 PM and 8 PM.  She took home medications without relief.  She has not had anything to eat or drink since about 1 PM yesterday.  She awoke this morning to go to work.  At about 5:45 AM she got into a shower.  She remembers "feeling woozy" very briefly before she fell.  Patient was assisted by her husband who called EMS.  Patient was initially confused.  No seizure-like activity noted.  She became less confused at home prior to transport.  She currently complains of a headache and left-sided facial pain.  She sustained a laceration to her lip which is through and through.  No neck pain.  No history of cardiac disease or arrhythmia.  She denies any current chest pain or shortness of breath.  No lower extremity swelling.  Patient is on a diuretic for Mnire's disease.  She takes this every other day.  Last tetanus shot was a year ago.  The onset of this condition was acute. The course is improving. Aggravating factors: none. Alleviating factors: none.       Past Medical History:  Diagnosis Date  . Bradycardia   . Cancer ([place])   . Cervical polyp   . Fatigue   . History of radioactive iodine thyroid ablation   . Hypothyroidism, postop   . Obesity   . Postpartum care following vaginal delivery (9/6) 02/16/2015  . Thyroid cancer ([place])   . Thyroid disease    HYPOTHYROIDISM    Patient Active  Problem List   Diagnosis Date Noted  . Well adult exam 11/02/2017  . Meniere's disease of both ears 11/02/2017  . Retained placenta, delivered 02/25/2017  . Status post dilation and curettage 02/25/2017  . Hypothyroidism 02/18/2015  . Rh negative status during pregnancy 02/18/2015  . Postpartum care following vaginal delivery (9/16) 02/16/2015  . PROM (premature rupture of membranes) 02/15/2015  . Encounter for supervision of other normal pregnancy 06/22/2014  . Infertility, female 03/10/2014  . Female hirsutism 03/10/2014  . Obesity, unspecified 10/31/2012  . Iatrogenic hyperthyroidism 04/24/2012  . Obesity 11/07/2010  . Hypothyroidism, postop   . Fatigue   . Bradycardia   . 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 DILATION AND CURETTAGE OF UTERUS N/A 02/25/2017   Procedure: DILATATION AND CURETTAGE;  Surgeon: Charyl Bigger, MD;  Location: [facility];  Service: Gynecology;  Laterality: N/A;  . TOTAL THYROIDECTOMY       OB History    Gravida  2   Para  2   Term  2   Preterm      AB      Living  2     SAB      TAB      Ectopic      Multiple  0   Live  Births  2            Home Medications    Prior to Admission medications   Medication Sig Start Date End Date Taking? Authorizing Provider  cetirizine (ZYRTEC) 10 MG tablet Take 10 mg by mouth daily.    [provider]  diazepam (VALIUM) 5 MG tablet Take 1 tablet (5 mg total) by mouth every 6 (six) hours as needed (dizziness). Patient not taking: Reported on 01/07/2018 05/24/17   Drenda Freeze, MD  fluticasone [facility]) 50 MCG/ACT nasal spray Place into both nostrils daily.    [provider]  ibuprofen (ADVIL,MOTRIN) 600 MG tablet Take 1 tablet (600 mg total) by mouth every 6 (six) hours. Patient not taking: Reported on 01/07/2018 02/26/17    Darliss Cheney, CNM  levothyroxine (SYNTHROID, LEVOTHROID) 125 MCG tablet Take 1 tablet (125 mcg total) by mouth daily before breakfast. 09/20/17   Sherrlyn Hock, MD  meclizine (ANTIVERT) 25 MG tablet Take 25 mg by mouth 3 (three) times daily as needed for dizziness.    [provider]  ondansetron (ZOFRAN ODT) 4 MG disintegrating tablet 4mg  ODT q4 hours prn nausea/vomit Patient not taking: Reported on 01/07/2018 05/24/17   Drenda Freeze, MD  triamterene-hydrochlorothiazide (DYAZIDE) 37.5-25 MG capsule Take 1 capsule by mouth every morning. 09/13/17   [provider]    Family History Family History  Problem Relation Age of Onset  . Cancer Mother   . Hypertension Father   . Hyperlipidemia Father   . Heart disease Father        HEART ATTACK  . Heart attack Father   . Cancer Brother   . Diabetes Maternal Grandfather   . Hypothyroidism Paternal Grandmother   . Heart attack Paternal Grandmother   . Hypertension Paternal Grandmother   . Heart attack Paternal Uncle   . Hypertension Paternal Uncle   . Heart attack Paternal Grandfather   . Hypertension Paternal Grandfather     Social History Social History   Tobacco Use  . Smoking status: Never Smoker  . Smokeless tobacco: Never Used  Substance Use Topics  . Alcohol use: No  . Drug use: No     Allergies   Patient has no known allergies.   Review of Systems Review of Systems  Constitutional: Negative for fatigue.  HENT: Negative for tinnitus.   Eyes: Negative for photophobia, pain and visual disturbance.  Respiratory: Negative for shortness of breath.   Cardiovascular: Negative for chest pain and leg swelling.  Gastrointestinal: Positive for nausea and vomiting.  Musculoskeletal: Negative for back pain, gait problem and neck pain.  Skin: Positive for wound.  Neurological: Positive for syncope, light-headedness and headaches. Negative for dizziness, weakness and numbness.    Psychiatric/Behavioral: Negative for confusion and decreased concentration.     Physical Exam Updated Vital Signs BP 116/72   Pulse 65   Temp 98 F (36.7 C) (Oral)   Resp 16   LMP  (LMP Unknown)   SpO2 96%   Physical Exam  Constitutional: She is oriented to person, place, and time. She appears well-developed and well-nourished.  HENT:  Head: Normocephalic and atraumatic. Head is without raccoon's eyes and without Battle's sign.  Right Ear: Tympanic membrane, external ear and ear canal normal. No hemotympanum.  Left Ear: Tympanic membrane, external ear and ear canal normal. No hemotympanum.  Nose: Nose normal. No nasal septal hematoma.  Mouth/Throat: Uvula is midline, oropharynx is clear and moist and mucous membranes are normal.  2cm,  gaping, through and through lac L lower lip involving vermilion border. Intraoral lac is well-approximated but obviously communicates with external laceration.  Wound is hemostatic and clean.   Eyes: Pupils are equal, round, and reactive to light. Conjunctivae, EOM and lids are normal. Right eye exhibits no nystagmus. Left eye exhibits no nystagmus.  No visible hyphema noted  Neck: Normal range of motion. Neck supple.  Cardiovascular: Normal rate and regular rhythm.  Pulmonary/Chest: Effort normal and breath sounds normal.  Abdominal: Soft. There is no tenderness.  Musculoskeletal: She exhibits no edema or tenderness.       Cervical back: She exhibits normal range of motion, no tenderness and no bony tenderness.       Thoracic back: She exhibits no tenderness and no bony tenderness.       Lumbar back: She exhibits no tenderness and no bony tenderness.  Moves all extremities without apparent pain.   Neurological: She is alert and oriented to person, place, and time. She has normal strength and normal reflexes. No cranial nerve deficit or sensory deficit. Coordination normal. GCS eye subscore is 4. GCS verbal subscore is 5. GCS motor subscore is 6.   Skin: Skin is warm and dry.  Psychiatric: She has a normal mood and affect.  Nursing note and vitals reviewed.    ED Treatments / Results  Labs (all labs ordered are listed, but only abnormal results are displayed) Labs Reviewed  CBC - Abnormal; Notable for the following components:      Result Value   WBC 14.9 (*)    All other components within normal limits  BASIC METABOLIC PANEL  I-STAT BETA HCG BLOOD, ED ([facility], [facility], [facility] ONLY)    EKG EKG Interpretation  Date/Time:  Monday February 04 2018 07:14:55 EDT Ventricular Rate:  66 PR Interval:    QRS Duration: 108 QT Interval:  425 QTC Calculation: 446 R Axis:   80 Text Interpretation:  Normal sinus rhythm Consider right atrial enlargement No old tracing to compare Confirmed by Sherwood Gambler [phone]) on 02/04/2018 7:28:24 AM   Radiology Ct Head Wo Contrast  Result Date: 02/04/2018 CLINICAL DATA:  Pt had n episode of vertigo and fell in the shower She had LOC for a few minutes She hit her mouth and has a gash on the left side of bottom lip head ache also; Pt had n episode of vertigo and fell in the shower She hit her mouth and has a gash on the left side of bottom lip head ache also EXAM: CT HEAD WITHOUT CONTRAST CT MAXILLOFACIAL WITHOUT CONTRAST TECHNIQUE: Multidetector CT imaging of the head and maxillofacial structures were performed using the standard protocol without intravenous contrast. Multiplanar CT image reconstructions of the maxillofacial structures were also generated. COMPARISON:  None. FINDINGS: CT HEAD FINDINGS Brain: No evidence of acute infarction, hemorrhage, hydrocephalus, extra-axial collection or mass lesion/mass effect. Vascular: No hyperdense vessel or unexpected calcification. Skull: Normal. Negative for fracture or focal lesion. Other: None. CT MAXILLOFACIAL FINDINGS Osseous: No fracture or mandibular dislocation. No destructive process. Orbits: Negative. No traumatic or inflammatory finding. Sinuses: Clear. Soft  tissues: There is soft tissue swelling adjacent to the LEFT aspect of the mandible. No associated fracture. IMPRESSION: 1.  No evidence for acute intracranial abnormality. 2. No evidence for acute maxillofacial fracture. 3. Soft tissue swelling adjacent to the LEFT aspect of mandible, not associated with fracture or dislocation of the mandible. Electronically Signed   By: Nolon Nations M.D.   On: 02/04/2018 08:47  Ct Maxillofacial Wo Contrast  Result Date: 02/04/2018 CLINICAL DATA:  Pt had n episode of vertigo and fell in the shower She had LOC for a few minutes She hit her mouth and has a gash on the left side of bottom lip head ache also; Pt had n episode of vertigo and fell in the shower She hit her mouth and has a gash on the left side of bottom lip head ache also EXAM: CT HEAD WITHOUT CONTRAST CT MAXILLOFACIAL WITHOUT CONTRAST TECHNIQUE: Multidetector CT imaging of the head and maxillofacial structures were performed using the standard protocol without intravenous contrast. Multiplanar CT image reconstructions of the maxillofacial structures were also generated. COMPARISON:  None. FINDINGS: CT HEAD FINDINGS Brain: No evidence of acute infarction, hemorrhage, hydrocephalus, extra-axial collection or mass lesion/mass effect. Vascular: No hyperdense vessel or unexpected calcification. Skull: Normal. Negative for fracture or focal lesion. Other: None. CT MAXILLOFACIAL FINDINGS Osseous: No fracture or mandibular dislocation. No destructive process. Orbits: Negative. No traumatic or inflammatory finding. Sinuses: Clear. Soft tissues: There is soft tissue swelling adjacent to the LEFT aspect of the mandible. No associated fracture. IMPRESSION: 1.  No evidence for acute intracranial abnormality. 2. No evidence for acute maxillofacial fracture. 3. Soft tissue swelling adjacent to the LEFT aspect of mandible, not associated with fracture or dislocation of the mandible. Electronically Signed   By: Nolon Nations  M.D.   On: 02/04/2018 08:47    Procedures .Marland KitchenLaceration Repair Date/Time: 02/04/2018 3:41 PM Performed by: Carlisle Cater, PA-C Authorized by: Carlisle Cater, PA-C   Consent:    Consent given by:  Patient   Risks discussed:  Infection, pain, poor cosmetic result and need for additional repair   Alternatives discussed:  No treatment Anesthesia (see MAR for exact dosages):    Anesthesia method:  Local infiltration   Local anesthetic:  Lidocaine 1% w/o epi Laceration details:    Location:  Lip   Lip location:  Lower lip, full thickness   Vermilion border involved: yes     Length (cm):  2 Repair type:    Repair type:  Simple Pre-procedure details:    Preparation:  Patient was prepped and draped in usual sterile fashion Exploration:    Hemostasis achieved with:  Direct pressure   Wound exploration: wound explored through full range of motion and entire depth of wound probed and visualized     Contaminated: no   Treatment:    Amount of cleaning:  Standard Skin repair:    Repair method:  Sutures   Suture size:  5-0   Suture material:  Nylon   Suture technique:  Simple interrupted   Number of sutures:  4 Approximation:    Approximation:  Close Post-procedure details:    Dressing:  Open (no dressing)   Patient tolerance of procedure:  Tolerated well, no immediate complications Comments:     Good reapproximation of the vermilion border.    (including critical care time)  Medications Ordered in ED Medications  sodium chloride 0.9 % bolus 1,000 mL (0 mLs Intravenous Stopped 02/04/18 1058)  lidocaine (PF) (XYLOCAINE) 1 % injection 10 mL (10 mLs Infiltration Given 02/04/18 0852)  acetaminophen (TYLENOL) tablet 650 mg (650 mg Oral Given 02/04/18 0854)     Initial Impression / Assessment and Plan / ED Course  I have reviewed the triage vital signs and the nursing notes.  Pertinent labs & imaging results that were available during my care of the patient were reviewed by me and  considered in my medical  decision making (see chart for details).     Patient seen and examined. EKG reviewed. Work-up initiated. Medications ordered.   Vital signs reviewed and are as follows: BP 116/72   Pulse 65   Temp 98 F (36.7 C) (Oral)   Resp 16   LMP  (LMP Unknown)   SpO2 96%   EKG without concerning findings.  Patient is not significantly orthostatic, she is back at her baseline.  No significant anemia.  Patient updated on results.  Wound repaired as above without comp occasions.  Patient counseled on wound care. Patient counseled on need to return or see PCP/urgent care for suture removal in 3-5 days. Patient was urged to return to the Emergency Department urgently with worsening pain, swelling, expanding erythema especially if it streaks away from the affected area, fever, or if they have any other concerns. Patient verbalized understanding.    Final Clinical Impressions(s) / ED Diagnoses   Final diagnoses:  Syncope, unspecified syncope type  Complicated laceration of lip, initial encounter  Facial contusion, initial encounter   Patient with syncopal episode and facial laceration today, repaired as above.  Complicated by vermilion border involvement.  Patient was having nausea and vomiting yesterday due to her underlying Mnire's disease which is likely contributing.  She likely had vasodilation in setting of dehydration and syncopized in the hot shower today.  Head imaging and facial imaging were negative.  Lab work-up, EKG and vital signs were reassuring.  No concern for PE, intercerebral hemorrhage, or other emergent etiology at this time.  Doubt cardiac arrhythmia given other more likely cause.  Patient has a couple days before she needs to return to work.  Comfortable discharged home at this time.   ED Discharge Orders    None       Carlisle Cater, [place] 02/04/18 1547    Tegeler, Gwenyth Allegra, MD 02/04/18 [phone]

## 2018-03-11 ENCOUNTER — Encounter: Payer: Self-pay | Admitting: Family Medicine

## 2018-03-12 NOTE — Telephone Encounter (Signed)
Have we received any paperwork from her regarding this?

## 2018-03-22 ENCOUNTER — Other Ambulatory Visit: Payer: Self-pay | Admitting: "Endocrinology

## 2018-03-22 DIAGNOSIS — E0580 Other thyrotoxicosis without thyrotoxic crisis or storm: Secondary | ICD-10-CM

## 2018-03-22 DIAGNOSIS — E890 Postprocedural hypothyroidism: Secondary | ICD-10-CM

## 2018-03-22 DIAGNOSIS — C73 Malignant neoplasm of thyroid gland: Secondary | ICD-10-CM

## 2018-03-22 MED FILL — TRIAMTERENE/HCTZ 37.5/25 CP: 37.5-25 | 30 days supply | Qty: 30 | Fill #4

## 2018-04-02 MED FILL — SYNTHROID 125 MCG TABLET: 125 | 90 days supply | Qty: 90 | Fill #0

## 2018-07-04 MED FILL — TRIAMTERENE/HCTZ 37.5/25 CP: 37.5-25 | 30 days supply | Qty: 30 | Fill #5

## 2018-07-04 MED FILL — SYNTHROID 125 MCG TABLET: 125 | 90 days supply | Qty: 90 | Fill #1

## 2018-07-10 ENCOUNTER — Ambulatory Visit (INDEPENDENT_AMBULATORY_CARE_PROVIDER_SITE_OTHER): Payer: 59 | Admitting: "Endocrinology

## 2018-07-31 ENCOUNTER — Ambulatory Visit (INDEPENDENT_AMBULATORY_CARE_PROVIDER_SITE_OTHER): Payer: 59 | Admitting: "Endocrinology

## 2018-07-31 ENCOUNTER — Encounter (INDEPENDENT_AMBULATORY_CARE_PROVIDER_SITE_OTHER): Payer: Self-pay | Admitting: "Endocrinology

## 2018-07-31 VITALS — BP 114/70 | HR 68 | Wt 189.2 lb

## 2018-07-31 DIAGNOSIS — C73 Malignant neoplasm of thyroid gland: Secondary | ICD-10-CM | POA: Diagnosis not present

## 2018-07-31 DIAGNOSIS — E890 Postprocedural hypothyroidism: Secondary | ICD-10-CM | POA: Diagnosis not present

## 2018-07-31 DIAGNOSIS — E663 Overweight: Secondary | ICD-10-CM | POA: Diagnosis not present

## 2018-07-31 DIAGNOSIS — R5383 Other fatigue: Secondary | ICD-10-CM | POA: Diagnosis not present

## 2018-07-31 NOTE — Patient Instructions (Signed)
Follow up visit in 12 months. Repeat lab tests 1-2 weeks prior.

## 2018-07-31 NOTE — Progress Notes (Signed)
CC: FU papillary thyroid cancer, post-operative hypothyroidism, iatrogenic hyperthyroidism, fatigue, bradycardia, overweight/obesity, recent pregnancy.  A. HPI: Ms. Ibach is a 43 y.o. Caucasian woman who is unaccompanied today.  1. Ms. Chanda was diagnosed with hypothyroidism, presumably secondary to Hashimoto's Disease, at some time prior to 2010. She was treated with Synthroid, 50 mcg per day. In the Summer of 2010 a goiter was noted.  A thyroid US showed multiple nodules bilaterally, with the largest being 2.6 cm in diameter. Ms. Heaton was referred to Dr. Stark Klein, a local general surgeon, for further evaluation and management. Dr. Barry Dienes performed a fine needle aspiration, which reportedly showed Hashimoto's Disease. A follow-up thyroid US in the Summer of 2011 showed that the previously aspirated nodule and two other nodules had enlarged during the year. Dr. Barry Dienes performed a total thyroidectomy on 05/19/10. 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 The largest microfocus was in the superior right lobe, measured 4 mm in diameter, invaded the capsule, but did not extend extrathyroidally.    2. I saw the patient and her husband in consultation on 121/16/11. Clinically she showed no evidence of active thyroid cancer. I reviewed her surgical notes and pathology reports with the patient and her husband. I also discussed with them the latest clinical guidelines on the management of thyroid cancer from the [employer]. We discussed the options for therapy, including whether or not to give radioactive iodine therapy (RAI). Although microfocal cancers usually have a low risk of recurrence or death, her case had two factors that would potentially increase her risks. First, the fact that she had multiple foci in both lobes. Second, the fact that one microfocus did invade the thyroid capsule and might have  spread extrathyroidally, although the pathologist did not see evidence of extrathyroidal extension on the slides he reviewed.  I asked the patient to take oral contraceptives from that point until she either decided not to take RAI or until at least one year after receiving RAI. She agreed.  3.  On 06/14/10 I met with Ms. Rigel and her husband again and we had another full and open discussion about the advantages and disadvantages of using RAI. Since the Hennises did not yet have children, and since neither of them wanted to leave a cancer untreated when a potentially curative treatment such as RAI was available, they wanted to proceed with RAI as soon as possible. Since Ms. Goto had not been on thyroid hormone replacement since surgery, she chose to receive RAI while undergoing thyroid hormone withdrawal and while following a strict low iodine diet. On 06/27/10, Ms. Maring received 50 millicuries of B-449. Her post-RAI scan showed uptake in the thyroid bed, but no spread outside the thyroid bed. As I explained to the Hennises, the I-131 uptake could have been due to the presence of some residual thyroid cells, or to some thyroid cancer cells, or both.   4. The patient has done well clinically since her I-131 therapy. She has had no evidence of recurrence on serial physical exams. Her thyroglobulin (Tg) levels have progressively decreased. On 06/01/10 the post-operative Tg level was 20.8. On 06/14/10 the pre-I-131 Tg level was 2.9. She was hypothyroid for both of these Tg values. On 09/08/10 the post-I-131 Tg level was <0.2. At that point Ms. Birge was taking Synthroid, 175 mcg/day. Her unstimulated thyroglobulin levels have remained < 0.2 ever since.   5. Ms Cerasoli'  last clinic visit with me was on 04/17/17. I continued her Synthroid dose of 125 mcg/day.    A. In the interim she has been healthy. Her TFTs in October were normal on the dose of 125 mcg/day. However, after reviewing her lab results from  08/06/17 and 09/26/17, I changed her dose to 125 mcg/day for 6 days each week, but increased the dose to 250 mcg/day on the 7th day of each week.  B. She is feeling good. Her energy level is good.  C. Her allergies have been quiescent. She had "a little bit" of vertigo a few weeks ago. Overall the frequency of her vertigo is much less. She rarely needs to take meclizine. She has been diagnosed with Meniere's' disease in the past.   6. Pertinent Review of Systems: Constitutional. The patient feels "good overall". She has no significant complaints that pertain to today's visit.  Sleep: She sleeps well when the baby sleeps well.    Body temperature: The patient's body temperature seems to be normal overall. Weight: Weight has decreased by 5 pounds to 189 pounds.   Eyes: The patient's vision is good. She is not aware of any eye problems. Neck: The patient is not aware of any lumps or masses in her anterior neck and thyroid bed.  There have been no significant other problems with  pressure, discomfort, or difficulty swallowing. Heart: The patient feels the expected increase in heart rate during exercise or other physical activities. There have been no significant problems with palpitations, irregular heart beats, chest pain, or chest pressure. Gastrointestinal: Bowel movements are normal. There are no significant complaints of excessive hunger, upset stomach, stomach aches or pains, diarrhea, or constipation. Musculoskeletal: Muscles and extremities appear to be working normally. There are no significant problems with hand tremor, sweaty palms, palmar erythema, or lower leg swelling. Psychological: Mood is "good". Mental: The patient's abilities to think, to pay attention, to remember, and to make decisions are "fine".  GYN: Her LMP was prior to her IUD insertion about November 2018.   PAST MEDICAL, FAMILY, AND SOCIAL HISTORY: 1. Work and family: She works full-time as a Writer at [employer]. 2.  Activities: She is walking more with her husband.    3. Primary care provider: Dr. Luetta Nutting at [facility] (?) 4. OB-GYN: Dr. Aloha Gell, [facility] [facility], [phone] 5. Surgeon: Dr. Stark Klein, [facility]  3. REVIEW OF SYSTEMS: Ms. Urquiza has no significant problems related to any of her other body systems  PHYSICAL EXAM: BP 114/70   Pulse 68   Wt 189 lb 3.2 oz (85.8 kg)   BMI 29.01 kg/m    Constitutional: She looks healthy, but still a bit tired today. She has lost 5 pounds since her last visit.  Her affect and insight are normal.  Eyes: There is no arcus or proptosis.  Mouth: The oropharynx appears normal. The tongue appears normal. There is normal oral moisture. There is no obvious gingivitis. She has her usual mild grade I blond vellus mustache today.  Neck: There are no bruits present. The thyroid gland is absent. There continues to be some very mild residual induration of the thyroid bed over the strap muscles, more on the left side than on the right. There is no evidence of masses or nodes in the posterior neck, lateral neck, thyroid bed or supraclavicular areas. There is no tenderness to palpation. Lungs: The lungs are clear. Air movement is good. Heart: The heart rhythm and  rate appear normal. Heart sounds S1 and S2 are normal. I do not appreciate any pathologic heart murmurs. Abdomen: The abdomen is enlarged. Bowel sounds are normal. The abdomen is soft and non-tender. There is no obviously palpable hepatomegaly, splenomegaly, or other masses.  Arms: Muscle mass appears appropriate for age.  Hands: There is no obvious tremor. Phalangeal and metacarpophalangeal joints appear normal. Palms are normal. Legs: Muscle mass appears appropriate for age. There is no edema.  Neurologic: Muscle strength is normal for age and gender  in both the upper and the lower extremities. Muscle tone appears normal. Sensation to touch is normal in the  legs.   No flowsheet data found.   Labs 02/04/18: BMP normal; CBC normal except WBC 14.9 during an illness in which she passed out with orthostasis due to dehydration.   Labs 04/04/17: TSH 0.62, free T4 1.3, free T3 2.7  Labs 01/07/18: TSH 0.81, free T4 1.6, free T3 3.0  Labs 09/26/17: TSH 5.02, free T4 1.2, free T3 2.6, thyroglobulin <0.1, thyroglobulin antibody <1  Labs 08/06/17: TSH 3.86, free T4 1.4, free T3 3.0  Labs 04/04/17: TSH 0.62, free T4 1.3, free T3 2.7  Labs 01/09/17: TSH 0.22, free T4 1.4, free T3 3.0, thyroglobulin <0.1, anti-thyroglobulin antibody <1  Labs 10/30/16: TSH 0.70, T4 7.6 (ref 4.5-12), T3 142 (ref 71-180); TSI <0.1 IU/L  Labs 06/27/16: TSH 5.43, free T4 1.3, free T3 3.2  Labs: 03/15/16: TSH 7.68, free T4 1.6, free T3 3.1  Labs 01/25/16: TSH 8.39, free T4 1.2, free T3 3.0  Labs 10/26/15: TSH 0.96, free T4 1.2, free T3 2.8, thyroglobulin <0.1, anti-thyroglobulin antibody <1   Labs 07/10/15: TSH 0.190, free T4 2.4, free T3 3.4  Labs 05/03/15: TSH 0.162, free T4 1.95, free T3 3.2, thyroglobulin (Tg) < 0.1, anti-Tg antibody < 1  Labs 01/22/15: TSH 0.529, free T4 1.29, free T3 2.8  Labs 12/16/14: TSH 0.811, free T4 1.00, free T3 2.4  Labs 11/05/14: TSH 0.207, free T4 1.17, free T3 2.7  Labs 08/08/13: TSH 2.325, free T4 1.08, free T3 2.9  Labs 05/30/14: Thyroglobulin < 0.1, anti-thyroglobulin antibody <1, TPO antibody 37, TSH 0.209, free T4 1.29, free T3 3.5  Labs 11/19/13: TSH 0.217, free T4 1.73, free T3 3.4, thyroglobulin <0.2, thyroglobulin antibody < 20  Labs 09/18/13: TSH 0.137, free T4 1.70, free T3 3.4  Labs 05/30/13: TSH 1.422, free T4 1.51, free T3 3.1, thyroglobulin <0.2, thyroglobulin antibody <20  Labs 02/19/13: TSH 1.734, free T4 1.35, free T3 3.2, TPO antibody 40.5, Tg antibody < 20, thyroglobulin < 0.2  Labs 10/01/12: TSH 0.312, free T4 1.66, free T3 3.0, TPO antibody 44.2, thyroglobulin < 0.2, anti-thyroglobulin antibody < 20.  Labs 10/23/11: TSH  0.214, free T4 1.54, free T3 3.2, thyroglobulin < 0.2, anti-thyroglobulin antibody < 20  Labs 08/21/11: TSH 17.234, free T4 1.73, free T3 3.3, Thyrogen-stimulated thyroglobulin (Tg) < 0.2 (undetectable). Anti-thyroglobulin antibody < 20 (normal).  Labs 04/20/11: Thyroglobulin (Tg) < 0.2 (undetectable), anti-thyroglobulin antibody (Tg Ab) < 20   Labs 04/04/11: TSH 0.154, free T4 1.57, free T3 3.1  Labs 08/19/10: Thyroglobulin <0.2. Thyroglobulin antibody <20.  ASSESSMENT: 1. Papillary thyroid cancer:   A. Her exam is normal today. There is still no clinical or lab evidence of recurrence.   B. As noted above, her Thyrogen-stimulated thyroglobulin (Tg) levels and unstimulated Tg levels have been unmeasurable since March 2012. She has not had any chemical evidence of recurrence.   C. Today she still does not  show any signs of clinical recurrence. Her Tg panel on 09/26/17 also showed no signs of recurrence.   D. At this point, 8 years after her I-131 therapy, it appears that Ms. Clavel' PTC is in remission and is probably cured. She will need continuing follow-up of her PTC annually, but may need adjustment of her Synthroid dose every 6 months.   D. Although Ms. Boerner still needs Synthroid therapy, we can allow her TSH to increase to the 0.50-2.0 range.  2. Hypothyroid/hyperthyroid:   A. Her TSH had been suppressed iatrogenically since 2012, but was elevated from August 2017 to January 2018.  Her TFTs have fluctuated several times and we have adjusted her Synthroid doses accordingly. Her last dose change was in April 2019. Her TFTs in July 2019 were good.   B. She is clinically euthyroid today.  3. Fatigue: Her fatigue has improved since her delivery in September.   4. Bradycardia: Resolved.  5. Obesity: She has lost weight since delivery. She is now "overweight".  6. Hirsutism: Her fat cell weight has caused some increase in insulin resistance and a compensatory increase in insulin production. The  hyperinsulinemia, in turn, has caused more production of testosterone from her ovaries and androstenedione from her adrenal glands. Reducing her fat cell volume should result in reducing her androgen levels.      PLAN: 1. Diagnostic: I reviewed her recent TFTs and Tg panel today. We will repeat her TFTs and Tg panel today. We will then repeat the TFTs and Tg panel in one year,or sooner if indicated.  2. Therapeutic: Continue the Synthroid dosage of 125 mcg daily for 6 days each week and 250 mcg/day for one day each week. Adjust the Synthroid doses in the future as needed.  3. Patient education: We discussed the issue of maintaining her TSH in the lower half of the normal range as a preventive measure.  4. Follow-up: Follow-up appointment in 12 months.   Level of Service: This visit lasted in excess of 60 minutes. More than 50% of the visit was devoted to counseling.  Sherrlyn Hock, MD, CDE Adult and Pediatric Endocrinology

## 2018-07-31 NOTE — Progress Notes (Deleted)
Note Status: Signed Cosign: Cosign Not Required Encounter Date: 07/31/2018  Editor: Sherrlyn Hock, MD (Physician)    CC: FU papillary thyroid cancer, post-operative hypothyroidism, iatrogenic hyperthyroidism, fatigue, bradycardia, overweight/obesity  A. HPI: Betty Reyes is a 43 y.o. Caucasian woman who is unaccompanied today.  1. [name] was diagnosed with hypothyroidism, presumably secondary to Hashimoto's Disease, at some time prior to 2010. She was treated with Synthroid, 50 mcg per day. In the Summer of 2010 a goiter was noted.  A thyroid US showed multiple nodules bilaterally, with the largest being 2.6 cm in diameter. [name] was referred to Dr. Stark Klein, a local general surgeon, for further evaluation and management. Dr. Barry Dienes performed a fine needle aspiration, which reportedly showed Hashimoto's Disease. A follow-up thyroid US in the Summer of 2011 showed that the previously aspirated nodule and two other nodules had enlarged during the year. Dr. Barry Dienes performed a total thyroidectomy on 05/19/10. 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 The largest microfocus was in the superior right lobe, measured 4 mm in diameter, invaded the capsule, but did not extend extrathyroidally.    2. I saw the patient and her husband in consultation on 121/16/11. Clinically she showed no evidence of active thyroid cancer. I reviewed her surgical notes and pathology reports with the patient and her husband. I also discussed with them the latest clinical guidelines on the management of thyroid cancer from the American Thyroid Association. We discussed the options for therapy, including whether or not to give radioactive iodine therapy (RAI). Although microfocal cancers usually have a low risk of recurrence or death, her case had two factors that would potentially increase her risks. First, the fact that she had multiple  foci in both lobes. Second, the fact that one microfocus did invade the thyroid capsule and might have spread extrathyroidally, although the pathologist did not see evidence of extrathyroidal extension on the slides he reviewed.  I asked the patient to take oral contraceptives from that point until she either decided not to take RAI or until at least one year after receiving RAI. She agreed.  3.  On 06/14/10 I met with [name] and her husband again and we had another full and open discussion about the advantages and disadvantages of using RAI. Since the Hennises did not yet have children, and since neither of them wanted to leave a cancer untreated when a potentially curative treatment such as RAI was available, they wanted to proceed with RAI as soon as possible. Since [name] had not been on thyroid hormone replacement since surgery, she chose to receive RAI while undergoing thyroid hormone withdrawal and while following a strict low iodine diet. On 06/27/10, [name] received 50 millicuries of Z-025. Her post-RAI scan showed uptake in the thyroid bed, but no spread outside the thyroid bed. As I explained to the Hennises, the I-131 uptake could have been due to the presence of some residual thyroid cells, or to some thyroid cancer cells, or both.   4. The patient has done well clinically since her I-131 therapy. She has had no evidence of recurrence on serial physical exams. Her thyroglobulin (Tg) levels have progressively decreased. On 06/01/10 the post-operative Tg level was 20.8. On 06/14/10 the pre-I-131 Tg level was 2.9. She was hypothyroid for both of these Tg values. On 09/08/10 the post-I-131 Tg level was <0.2. At that point [name] was taking  Synthroid, 175 mcg/day.   5. During the intervening years her thyroglobulin levels have been unmeasurable and her thyroglobulin antibody levels have been unmeasurable.  6. Ms Weir' last clinic visit with me was on 05/08/17.             A. In  the interim she has been healthy. Her TFTs in October 2018 were normal on the dose of 125 mcg/day. However, after reviewing her lab results from 08/06/17 and 09/26/17, I increased her Synthroid dose to 125 mcg/day for 6 days each week, but 250 mcg/day for one day each week.              B. She is feeling good. Her energy level is good.              C. Her allergies have been quiescent. She had "a little bit" of vertigo a few weeks ago. Overall, however, the frequency of her vertigo is much less.  She rarely needs to take meclizine. She has been diagnosed with Meniere's' disease in the past.   7. Pertinent Review of Systems: Constitutional. [name] feels "good overall". She has no significant complaints that pertain to today's visit.  Sleep: She sleeps well when the baby sleeps well.   Body temperature: Her body temperature seems to be normal overall. Weight: Weight has decreased 5 pounds to 189 pounds.   Eyes: er vision is good. She is not aware of any eye problems. Neck: She is not aware of any lumps or masses in her anterior neck and thyroid bed.  There have been no significant other problems with  pressure, discomfort, or difficulty swallowing. Heart: She feels the expected increase in heart rate during exercise or other physical activities. There have been no significant problems with palpitations, irregular heart beats, chest pain, or chest pressure. Gastrointestinal: Bowel movements are normal. There are no significant complaints of excessive hunger, upset stomach, stomach aches or pains, diarrhea, or constipation. Musculoskeletal: Muscles and extremities appear to be working normally. There are no significant problems with hand tremor, sweaty palms, palmar erythema, or lower leg swelling. Psychological: Mood is "good". Mental: Her abilities to think, to pay attention, to remember, and to make decisions are "fine".  GYN: Her LMP was prior to her IUD placement in about November 2018.    PAST  MEDICAL, FAMILY, AND SOCIAL HISTORY: 1. Work and family: She works full-time as a Writer at [employer]. 2. Activities: She is walking more with her husband.  She also has rowing machine at home. Her husband is now a stay at home dad. 3. Primary care provider: Dr. Luetta Nutting at [facility] on [address] (?) 4. OB/GYN: Dr. Aloha Gell, [facility], [phone] 5. Surgeon: Dr. Stark Klein, [facility]  3. REVIEW OF SYSTEMS: Ms. Emmerich has no significant problems related to any of her other body systems  PHYSICAL EXAM: BP 114/70   Pulse 68   Ht 5' 7.24" (1.708 m)   Wt 189 lb 3.2 oz (88.4 kg)   BMI 29.01 kg/m    Constitutional: She looks healthy, but still a bit tired today. She has lost 5 pounds since her last visit.  Her affect and insight are normal.  Eyes: There is no arcus or proptosis.  Mouth: The oropharynx appears normal. The tongue appears normal. There is normal oral moisture. There is no obvious gingivitis. She has her usual mild grade I blond vellus mustache today.  Neck: There are no bruits present. The thyroid gland is  absent. There continues to be some very mild residual induration of the thyroid bed over the strap muscles. There is no evidence of masses or nodes in the posterior neck, lateral neck, thyroid bed or supraclavicular areas. There is no tenderness to palpation. Lungs: The lungs are clear. Air movement is good. Heart: The heart rhythm and rate appear normal. Heart sounds S1 and S2 are normal. I do not appreciate any pathologic heart murmurs. Abdomen: The abdomen is enlarged. Bowel sounds are normal. The abdomen is soft and non-tender. There is no obviously palpable hepatomegaly, splenomegaly, or other masses.  Arms: Muscle mass appears appropriate for age.  Hands: There is no obvious tremor. Phalangeal and metacarpophalangeal joints appear normal. Palms are normal. Legs: Muscle mass appears appropriate for age. There is no edema.   Neurologic: Muscle strength is normal for age and gender  in both the upper and the lower extremities. Muscle tone appears normal. Sensation to touch is normal in the legs.  Labs 02/04/18: BMP normal; CBC normal, except WBC 14.9 during an illness in which she passed out with vertigos  Labs 01/07/18: TSH 081, free T4 1.6, free T3  3.0  Labs 09/26/17: TSH 5.02, free T4 1.2, free T3 2.6, thyroglobulin <0.1, thyroglobulin antibody <1  Labs 08/06/17: TSH 3.86, free T4 1.4, free T3 3.0  Labs 04/04/17: TSH 0.62, free T4 1.3, free T3 2.7  Labs 01/09/17: TSH 0.22, free T4 1.4, free T3 3.0, thyroglobulin <0.1, anti-thyroglobulin antibody <1  Labs 10/30/16: TSH 0.70, T4 7.6 (ref 4.5-12), T3 142 (ref 71-180); TSI <0.1 IU/L  Labs 06/27/16: TSH 5.43, free T4 1.3, free T3 3.2  Labs: 03/15/16: TSH 7.68, free T4 1.6, free T3 3.1  Labs 01/25/16: TSH 8.39, free T4 1.2, free T3 3.0  Labs 10/26/15: TSH 0.96, free T4 1.2, free T3 2.8, thyroglobulin <0.1, anti-thyroglobulin antibody <1   Labs 07/10/15: TSH 0.190, free T4 2.4, free T3 3.4  Labs 05/03/15: TSH 0.162, free T4 1.95, free T3 3.2, thyroglobulin (Tg) < 0.1, anti-Tg antibody < 1  Labs 01/22/15: TSH 0.529, free T4 1.29, free T3 2.8  Labs 12/16/14: TSH 0.811, free T4 1.00, free T3 2.4  Labs 11/05/14: TSH 0.207, free T4 1.17, free T3 2.7  Labs 08/08/13: TSH 2.325, free T4 1.08, free T3 2.9  Labs 05/30/14: Thyroglobulin < 0.1, anti-thyroglobulin antibody <1, TPO antibody 37, TSH 0.209, free T4 1.29, free T3 3.5  Labs 11/19/13: TSH 0.217, free T4 1.73, free T3 3.4, thyroglobulin <0.2, thyroglobulin antibody < 20  Labs 09/18/13: TSH 0.137, free T4 1.70, free T3 3.4  Labs 05/30/13: TSH 1.422, free T4 1.51, free T3 3.1, thyroglobulin <0.2, thyroglobulin antibody <20  Labs 02/19/13: TSH 1.734, free T4 1.35, free T3 3.2, TPO antibody 40.5, Tg antibody < 20, thyroglobulin < 0.2  Labs 10/01/12: TSH 0.312, free T4 1.66, free T3 3.0, TPO  antibody 44.2, thyroglobulin < 0.2, anti-thyroglobulin antibody < 20.  Labs 10/23/11: TSH 0.214, free T4 1.54, free T3 3.2, thyroglobulin < 0.2, anti-thyroglobulin antibody < 20  Labs 08/21/11: TSH 17.234, free T4 1.73, free T3 3.3, Thyrogen-stimulated thyroglobulin (Tg) < 0.2 (undetectable). Anti-thyroglobulin antibody < 20 (normal).  Labs 04/20/11: Thyroglobulin (Tg) < 0.2 (undetectable), anti-thyroglobulin antibody (Tg Ab) < 20   Labs 04/04/11: TSH 0.154, free T4 1.57, free T3 3.1  Labs 08/19/10: Thyroglobulin <0.2. Thyroglobulin antibody <20.  ASSESSMENT: 1. Papillary thyroid cancer:              A. Her exam is normal today. There  is still no clinical or lab evidence of recurrence.              B. As noted above, her Thyrogen-stimulated thyroglobulin (Tg) levels and unstimulated Tg levels have been unmeasurable since March 2012. She has not had any chemical evidence of recurrence.              C. Today she still does not show any signs of clinical recurrence. Her Tg panel on 09/26/17 also showed no signs of recurrence.              D. At this point, 9 years after her I-131 therapy, it appears that Ms. Blackerby' PTC is in remission and is probably cured. She will need continuing follow-up of her PTC annually, but may need adjustment of her Synthroid dose every 6 months.              D. Although Ms. Kazlauskas still needs Synthroid therapy, we can allow her TSH to increase to the 0.50-2.0 range.  2. Hypothyroid/hyperthyroid:              A. Her TSH had been suppressed iatrogenically since 2012, but was elevated from August 2017 to January 2018.  Her TFTS have fluctuated several times and we have adjusted her synthroid doses accordingly. Her last dose change was in April 2019. Her TFTS in July 2019 were good.              B. She is clinically euthyroid today.  3. Fatigue: Her fatigue has improved since her delivery in September.   4. Bradycardia: Resolved.  5. Obesity: She has continued to lose  weight since delivery. She is now overweight.   6. Hirsutism: Her fat cell weight has caused some increase in insulin resistance and a compensatory increase in insulin production. The hyperinsulinemia, in turn, has caused more production of testosterone from her ovaries and androstenedione from her adrenal glands. Reducing her fat cell volume should result in reducing her androgen levels.      PLAN: 1. Diagnostic: I reviewed her last TFTs and Tg panel today. We will repeat her TFTs and Tg panel now. We will then repeat the TFTs and Tg panel in one year, or sooner if indicated.  2. Therapeutic: Continue the Synthroid dosage of 125 mcg daily for 6 days each week and 250 mcg/day for one day each week. Adjust the Synthroid doses in the future as needed.  3. Patient education: We discussed the goal of maintaining her TSH in the lower half of the normal range as a preventive measure.  4. Follow-up: Follow-up appointment in 12 months.   Level of Service: This visit lasted in excess of 45 minutes. More than 50% of the visit was devoted to counseling.  Sherrlyn Hock, MD, CDE Adult and Pediatric Endocrinology                  Instructions   Follow up visit in July 2019. Please repeat the TFTs in January 2019. Please repeat the TFTs and thyroglobulin and thyroglobulin antibody 2 weeks prior to your next visit in July 2019.        Technical sales engineer History   Recipient Method Sent by Date Sent

## 2018-08-01 LAB — THYROGLOBULIN LEVEL

## 2018-08-01 LAB — THYROGLOBULIN ANTIBODY: Thyroglobulin Ab: <1 IU/mL (ref <=1)

## 2018-08-01 LAB — T4, FREE: Free T4: 1.7 ng/dL (ref 0.8–1.8)

## 2018-08-01 LAB — T3, FREE: T3 FREE: 3.7 pg/mL (ref 2.3–4.2)

## 2018-08-01 LAB — TSH: TSH: 0.93 mIU/L

## 2018-08-05 ENCOUNTER — Encounter (INDEPENDENT_AMBULATORY_CARE_PROVIDER_SITE_OTHER): Payer: Self-pay | Admitting: *Deleted

## 2018-08-29 ENCOUNTER — Telehealth: Payer: Self-pay | Admitting: Family Medicine

## 2018-08-29 NOTE — Telephone Encounter (Signed)
Called Pt to clarify FLMA paperwork that was dropped of today, 08/29/2018. LAM

## 2018-08-29 NOTE — Telephone Encounter (Signed)
Patient came by and dropped off FMLA paper that needs to be filled out. Patient would like for paperwork to be faxed after filling it out and would like to have a original copy after being filled out. Patient did mention that the FMLA that is going on now is about to up on September 11, 2018 and was requesting paperwork to be filled out before then. Please advise. Please call patient once paperwork is done. Papers will be in providers folder at the front.

## 2018-09-01 ENCOUNTER — Encounter: Payer: Self-pay | Admitting: Family Medicine

## 2018-09-02 ENCOUNTER — Other Ambulatory Visit: Payer: Self-pay | Admitting: Family Medicine

## 2018-09-02 MED ORDER — TRIAMTERENE-HCTZ 37.5-25 MG PO CAPS: 1.0000 | ORAL_CAPSULE | Freq: Every morning | ORAL | 1 refills | Status: DC

## 2018-09-02 MED ORDER — ONDANSETRON 8 MG PO TBDP: 8.0000 mg | ORAL_TABLET | Freq: Three times a day (TID) | ORAL | 6 refills | Status: DC | PRN

## 2018-09-03 MED FILL — ONDANSETRON ODT 8 MG TABLET: 8 | 10 days supply | Qty: 30 | Fill #0

## 2018-09-03 MED FILL — TRIAMTERENE/HCTZ 37.5/25 CP: 37.5-25 | 90 days supply | Qty: 90 | Fill #0

## 2018-09-04 ENCOUNTER — Telehealth: Payer: Self-pay | Admitting: Family Medicine

## 2018-09-04 NOTE — Telephone Encounter (Signed)
Called Pt. Requested last FLMA paperwork to be faxed to your office for a point of reference. Pt was given fax number and will fax tomorrow.

## 2018-09-04 NOTE — Telephone Encounter (Signed)
Patient called requesting update on FMLA paperwork.

## 2018-09-06 NOTE — Telephone Encounter (Signed)
Pt called back and left message on [place].  Pt states that she doesn't have a copy of old paperwork that it was filled out by the office and faxed directly to her employer.  Pt can be reached at [phone] if she can be of further assistance.

## 2018-09-07 ENCOUNTER — Encounter: Payer: Self-pay | Admitting: Family Medicine

## 2018-09-09 NOTE — Telephone Encounter (Signed)
New FLMA paperwork has been filled out, faxed directly to [employer] and hard copy send [place] Mail to Pt. Also, Pt chart will have copy of this as well.

## 2018-09-09 NOTE — Telephone Encounter (Signed)
Did she have previous FMLA faxed over.  How many days per month were on her previous FMLA?

## 2018-10-03 ENCOUNTER — Other Ambulatory Visit (INDEPENDENT_AMBULATORY_CARE_PROVIDER_SITE_OTHER): Payer: Self-pay | Admitting: "Endocrinology

## 2018-10-03 DIAGNOSIS — E890 Postprocedural hypothyroidism: Secondary | ICD-10-CM

## 2018-10-04 MED FILL — SYNTHROID 125 MCG TABLET: 125 | 90 days supply | Qty: 110 | Fill #0

## 2019-01-20 MED FILL — SYNTHROID 125 MCG TABLET: 125 | 90 days supply | Qty: 110 | Fill #0

## 2019-01-20 MED FILL — TRIAMTERENE/HCTZ 37.5/25 CP: 37.5-25 | 90 days supply | Qty: 90 | Fill #1

## 2019-01-23 ENCOUNTER — Encounter: Payer: Self-pay | Admitting: Family Medicine

## 2019-01-23 ENCOUNTER — Telehealth: Payer: Self-pay | Admitting: Family Medicine

## 2019-01-23 ENCOUNTER — Telehealth (INDEPENDENT_AMBULATORY_CARE_PROVIDER_SITE_OTHER): Payer: 59 | Admitting: Family Medicine

## 2019-01-23 ENCOUNTER — Other Ambulatory Visit: Payer: Self-pay

## 2019-01-23 DIAGNOSIS — H8103 Meniere's disease, bilateral: Secondary | ICD-10-CM

## 2019-01-23 NOTE — Progress Notes (Signed)
Betty Reyes - 43 y.o. female MRN [id number]  Date of birth: [birth year]   This visit type was conducted due to national recommendations for restrictions regarding the COVID-19 Pandemic (e.g. social distancing).  This format is felt to be most appropriate for this patient at this time.  All issues noted in this document were discussed and addressed.  No physical exam was performed (except for noted visual exam findings with Video Visits).  I discussed the limitations of evaluation and management by telemedicine and the availability of in person appointments. The patient expressed understanding and agreed to proceed.  I connected with@ on 01/23/19 at 11:00 AM EDT by a video enabled telemedicine application and verified that I am speaking with the correct person using two identifiers.   Patient Location: Home [place] [address]   Provider location:   Home office  Chief Complaint  Patient presents with  . Follow-up    FMLA paper work consult--its expired in 8/25--we got the paper work--last one done in March (no changes)    HPI  Betty Reyes is a 43 y.o. female who presents via audio/video conferencing for a telehealth visit today.  She is following up today for meniere's disease.  She needs updated FMLA paperwork for this.  She reports she has flares of her condition typically 1-2x/month.  She usually misses 1 day or at most 2 days of work to recover from these flare ups.  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  She denies any worsening of her condition since her last evaluation.    ROS:  A comprehensive ROS was completed and negative except as noted per HPI  Past Medical History:  Diagnosis Date  . Bradycardia   . Cancer ([place])   . Cervical polyp   . Fatigue   . History of radioactive iodine thyroid ablation   . Hypothyroidism, postop   . Obesity   . Postpartum  care following vaginal delivery (9/6) 02/16/2015  . Thyroid cancer ([place])   . Thyroid disease    HYPOTHYROIDISM    Past Surgical History:  Procedure Laterality Date  . DILATION AND CURETTAGE OF UTERUS N/A 02/25/2017   Procedure: DILATATION AND CURETTAGE;  Surgeon: Charyl Bigger, MD;  Location: [facility];  Service: Gynecology;  Laterality: N/A;  . TOTAL THYROIDECTOMY      Family History  Problem Relation Age of Onset  . Cancer Mother   . Hypertension Father   . Hyperlipidemia Father   . Heart disease Father        HEART ATTACK  . Heart attack Father   . Cancer Brother   . Diabetes Maternal Grandfather   . Hypothyroidism Paternal Grandmother   . Heart attack Paternal Grandmother   . Hypertension Paternal Grandmother   . Heart attack Paternal Uncle   . Hypertension Paternal Uncle   . Heart attack Paternal Grandfather   . Hypertension Paternal Grandfather     Social History   Socioeconomic History  . Marital status: Married    Spouse name: Not on file  . Number of children: Not on file  . Years of education: Not on file  . Highest education level: Not on file  Occupational History  . Not on file  Social Needs  . Financial resource strain: Not on file  . Food insecurity    Worry: Not on file    Inability: Not  on file  . Transportation needs    Medical: Not on file    Non-medical: Not on file  Tobacco Use  . Smoking status: Never Smoker  . Smokeless tobacco: Never Used  Substance and Sexual Activity  . Alcohol use: No  . Drug use: No  . Sexual activity: Yes    Partners: Male  Lifestyle  . Physical activity    Days per week: Not on file    Minutes per session: Not on file  . Stress: Not on file  Relationships  . Social Herbalist on phone: Not on file    Gets together: Not on file    Attends religious service: Not on file    Active member of club or organization: Not on file    Attends meetings of clubs or organizations: Not on file     Relationship status: Not on file  . Intimate partner violence    Fear of current or ex partner: Not on file    Emotionally abused: Not on file    Physically abused: Not on file    Forced sexual activity: Not on file  Other Topics Concern  . 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     Current Outpatient Medications:  .  cetirizine (ZYRTEC) 10 MG tablet, Take 10 mg by mouth daily., Disp: , Rfl:  .  diazepam (VALIUM) 5 MG tablet, Take 1 tablet (5 mg total) by mouth every 6 (six) hours as needed (dizziness)., Disp: 15 tablet, Rfl: 0 .  fluticasone (FLONASE) 50 MCG/ACT nasal spray, Place into both nostrils daily., Disp: , Rfl:  .  levothyroxine (SYNTHROID) 125 MCG tablet, Take 1-125 mcg tablet 6 days a week and 2-179mcg tablets 1 day a week, Disp: 110 tablet, Rfl: 1 .  meclizine (ANTIVERT) 25 MG tablet, Take 25 mg by mouth 3 (three) times daily as needed for dizziness., Disp: , Rfl:  .  ondansetron (ZOFRAN ODT) 8 MG disintegrating tablet, Take 1 tablet (8 mg total) by mouth every 8 (eight) hours as needed for nausea or vomiting., Disp: 30 tablet, Rfl: 6 .  triamterene-hydrochlorothiazide (DYAZIDE) 37.5-25 MG capsule, Take 1 each (1 capsule total) by mouth every morning., Disp: 90 capsule, Rfl: 1  EXAM:  VITALS per patient if applicable: Ht 5' 2.62" (1.72 m)   BMI 29.01 kg/m   GENERAL: alert, oriented, appears well and in no acute distress  HEENT: atraumatic, conjunttiva clear, no obvious abnormalities on inspection of external nose and ears  NECK: normal movements of the head and neck  LUNGS: on inspection no signs of respiratory distress, breathing rate appears normal, no obvious gross SOB, gasping or wheezing  CV: no obvious cyanosis  MS: moves all visible extremities without noticeable abnormality  PSYCH/NEURO: pleasant and cooperative, no obvious depression or anxiety, speech and  thought processing grossly intact  ASSESSMENT AND PLAN:  Discussed the following assessment and plan:  Meniere's disease of both ears -Symptoms during flare up of meniere's disease make it unsafe and impractical to work during these episodes.  -She will continue current medications and I'll update her FMLA paperwork.        I discussed the assessment and treatment plan with the patient. The patient was provided an opportunity to ask questions and all were answered. The patient agreed with the plan and demonstrated an understanding of the instructions.   The patient  was advised to call back or seek an in-person evaluation if the symptoms worsen or if the condition fails to improve as anticipated.    Luetta Nutting, DO

## 2019-01-23 NOTE — Telephone Encounter (Signed)
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 Going to give this to Dr. Zigmund Daniel.

## 2019-01-23 NOTE — Assessment & Plan Note (Signed)
-  Symptoms during flare up of meniere's disease make it unsafe and impractical to work during these episodes.  -She will continue current medications and I'll update her FMLA paperwork.

## 2019-01-23 NOTE — Telephone Encounter (Signed)
Patient came by and dropped off FMLA paperwork. Patient has not been seen since 10/2017 but I schedule an mychart visit with Dr. Zigmund Daniel for today at 11:00. Please call patient once paperwork in filled out. Paperwork will be in providers folder at the front.

## 2019-01-27 NOTE — Telephone Encounter (Signed)
Waiting for Dr. Zigmund Daniel signature. Please call the pt once its done.

## 2019-04-14 ENCOUNTER — Other Ambulatory Visit: Payer: Self-pay | Admitting: Family Medicine

## 2019-04-14 MED FILL — TRIAMTERENE/HCTZ 37.5/25 CP: 37.5-25 | 90 days supply | Qty: 90 | Fill #0

## 2019-04-14 NOTE — Telephone Encounter (Signed)
Please refill for 30 days and schedule a lab visit for BMP to recheck potassium.

## 2019-05-14 ENCOUNTER — Other Ambulatory Visit (INDEPENDENT_AMBULATORY_CARE_PROVIDER_SITE_OTHER): Payer: Self-pay | Admitting: "Endocrinology

## 2019-05-14 DIAGNOSIS — E890 Postprocedural hypothyroidism: Secondary | ICD-10-CM

## 2019-05-14 MED FILL — SYNTHROID 125 MCG TABLET: 125 | 90 days supply | Qty: 110 | Fill #0

## 2019-05-16 MED FILL — ONDANSETRON ODT 8 MG TABLET: 8 | 10 days supply | Qty: 30 | Fill #1

## 2019-06-20 ENCOUNTER — Encounter: Payer: Self-pay | Admitting: Family Medicine

## 2019-07-28 NOTE — Telephone Encounter (Signed)
I have sent a copy of the FMLA to scan in the chart. I have also mailed a copy of the information to the address on file. It has been faxed to [employer] as well. Mychart sent to patient for information.

## 2019-08-01 ENCOUNTER — Ambulatory Visit (INDEPENDENT_AMBULATORY_CARE_PROVIDER_SITE_OTHER): Payer: 59 | Admitting: "Endocrinology

## 2019-08-12 ENCOUNTER — Other Ambulatory Visit (INDEPENDENT_AMBULATORY_CARE_PROVIDER_SITE_OTHER): Payer: Self-pay | Admitting: "Endocrinology

## 2019-08-12 DIAGNOSIS — E890 Postprocedural hypothyroidism: Secondary | ICD-10-CM | POA: Diagnosis not present

## 2019-08-12 DIAGNOSIS — C73 Malignant neoplasm of thyroid gland: Secondary | ICD-10-CM | POA: Diagnosis not present

## 2019-08-13 LAB — THYROGLOBULIN LEVEL: Thyroglobulin: <0.1 ng/mL — ABNORMAL LOW

## 2019-08-13 LAB — T4, FREE: Free T4: 1.7 ng/dL (ref 0.8–1.8)

## 2019-08-13 LAB — TSH: TSH: 8.87 mIU/L — ABNORMAL HIGH

## 2019-08-13 LAB — THYROGLOBULIN ANTIBODY: Thyroglobulin Ab: <1 IU/mL (ref <=1)

## 2019-08-13 LAB — T3, FREE: T3, Free: 3.2 pg/mL (ref 2.3–4.2)

## 2019-08-21 ENCOUNTER — Encounter (INDEPENDENT_AMBULATORY_CARE_PROVIDER_SITE_OTHER): Payer: Self-pay | Admitting: *Deleted

## 2019-08-27 ENCOUNTER — Encounter (INDEPENDENT_AMBULATORY_CARE_PROVIDER_SITE_OTHER): Payer: Self-pay

## 2019-09-02 ENCOUNTER — Encounter (INDEPENDENT_AMBULATORY_CARE_PROVIDER_SITE_OTHER): Payer: Self-pay

## 2019-09-09 ENCOUNTER — Ambulatory Visit (INDEPENDENT_AMBULATORY_CARE_PROVIDER_SITE_OTHER): Payer: 59 | Admitting: "Endocrinology

## 2019-09-11 MED FILL — TRIAMTERENE/HCTZ 37.5/25 CP: 37.5-25 | 90 days supply | Qty: 90 | Fill #1

## 2019-09-11 MED FILL — SYNTHROID 125 MCG TABLET: 125 | 90 days supply | Qty: 110 | Fill #1

## 2019-09-24 ENCOUNTER — Encounter (INDEPENDENT_AMBULATORY_CARE_PROVIDER_SITE_OTHER): Payer: Self-pay | Admitting: "Endocrinology

## 2019-09-24 ENCOUNTER — Telehealth (INDEPENDENT_AMBULATORY_CARE_PROVIDER_SITE_OTHER): Payer: 59 | Admitting: "Endocrinology

## 2019-09-24 DIAGNOSIS — R5383 Other fatigue: Secondary | ICD-10-CM | POA: Diagnosis not present

## 2019-09-24 DIAGNOSIS — L680 Hirsutism: Secondary | ICD-10-CM

## 2019-09-24 DIAGNOSIS — Z85850 Personal history of malignant neoplasm of thyroid: Secondary | ICD-10-CM | POA: Diagnosis not present

## 2019-09-24 DIAGNOSIS — E890 Postprocedural hypothyroidism: Secondary | ICD-10-CM

## 2019-09-24 DIAGNOSIS — C73 Malignant neoplasm of thyroid gland: Secondary | ICD-10-CM

## 2019-09-24 NOTE — Progress Notes (Signed)
CC: FU papillary thyroid cancer, post-operative hypothyroidism, iatrogenic hyperthyroidism, fatigue, bradycardia, overweight/obesity, recent pregnancy.  A. HPI: Ms. Depass is a 44 y.o. Caucasian woman who is unaccompanied today.  1. Ms. Dinges was diagnosed with hypothyroidism, presumably secondary to Hashimoto's Disease, at some time prior to 2010. She was treated with Synthroid, 50 mcg per day. In the Summer of 2010 a goiter was noted.  A thyroid US showed multiple nodules bilaterally, with the largest being 2.6 cm in diameter. Ms. Desir was referred to Dr. Stark Klein, a local general surgeon, for further evaluation and management. Dr. Barry Dienes performed a fine needle aspiration, which reportedly showed Hashimoto's Disease. A follow-up thyroid US in the Summer of 2011 showed that the previously aspirated nodule and two other nodules had enlarged during the year. Dr. Barry Dienes performed a total thyroidectomy on 05/19/10. 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 The largest microfocus was in the superior right lobe, measured 4 mm in diameter, invaded the capsule, but did not extend extrathyroidally.    2. I saw the patient and her husband in consultation on 121/16/11. Clinically she showed no evidence of active thyroid cancer. I reviewed her surgical notes and pathology reports with the patient and her husband. I also discussed with them the latest clinical guidelines on the management of thyroid cancer from the American Thy[employer] Association. We discussed the options for therapy, including whether or not to give radioactive iodine therapy (RAI). Although microfocal cancers usually have a low risk of recurrence or death, her case had two factors that would potentially increase her risks. First, the fact that she had multiple foci in both lobes. Second, the fact that one microfocus did invade the thyroid capsule and might have  spread extrathyroidally, although the pathologist did not see evidence of extrathyroidal extension on the slides he reviewed.  I asked the patient to take oral contraceptives from that point until she either decided not to take RAI or until at least one year after receiving RAI. She agreed.  3.  On 06/14/10 I met with Ms. Nott and her husband again and we had another full and open discussion about the advantages and disadvantages of using RAI. Since the Hennises did not yet have children, and since neither of them wanted to leave a cancer untreated when a potentially curative treatment such as RAI was available, they wanted to proceed with RAI as soon as possible. Since Ms. Burdi had not been on thyroid hormone replacement since surgery, she chose to receive RAI while undergoing thyroid hormone withdrawal and while following a strict low iodine diet. On 06/27/10, Ms. Britain received 50 millicuries of Q-300. Her post-RAI scan showed uptake in the thyroid bed, but no spread outside the thyroid bed. As I explained to the Hennises, the I-131 uptake could have been due to the presence of some residual thyroid cells, or to some thyroid cancer cells, or both.   4. Clinical Course: The patient has done well clinically since her I-131 therapy. She has had no evidence of recurrence on serial physical exams. Her thyroglobulin (Tg) levels have progressively decreased. On 06/01/10 the post-operative Tg level was 20.8. On 06/14/10 the pre-I-131 Tg level was 2.9. She was hypothyroid for both of these Tg values. On 09/08/10 the post-I-131 Tg level was <0.2. At that point Ms. Ledet was taking Synthroid, 175 mcg/day. Her unstimulated thyroglobulin levels have remained < 0.2 ever since.   5.  Ms Dimperio' last clinic visit with me was on 07/31/18. I continued her Synthroid dose of 125 mcg/day for 6 days per week and 250 mcg/day for one day each week. After reviewing her TFTS from early March 2021, however, I changed her dosage to  125 mcg/day on 4 days each week and 250 mcg/day on three days per week. She began this new dosage on 09/08/19.   A. In the interim she has been healthy, but had been more fatigued prior to increasing her levothyroxine dosage.   B. Since increasing her Synthroid dosage she has been feeling pretty good. Her energy level is better.  C. Her allergies have been acting up, but have not been terrible. She did have vertigo once last week. She rarely needs to take meclizine. She has been diagnosed with Meniere's' disease in the past.   6. Pertinent Review of Systems: Constitutional. The patient feels "good overall". She has no significant complaints that pertain to today's visit.  Sleep: She sleeps well.    Body temperature: The patient's body temperature was cooler prior to increasing the levothyroxine dose.  Weight: Weight has decreased by 4 pounds to 185 pounds.   Eyes: The patient's vision is good. She is not aware of any eye problems. Neck: The patient is not aware of any lumps or masses in her anterior neck and thyroid bed.  There have been no significant other problems with  pressure, discomfort, or difficulty swallowing. Heart: The patient feels the expected increase in heart rate during exercise or other physical activities. There have been no significant problems with palpitations, irregular heart beats, chest pain, or chest pressure. Gastrointestinal: She was constipated and felt bloated 3-4 weeks ago, but not since then. There are no significant complaints of excessive hunger, upset stomach, stomach aches or pains, diarrhea, or constipation. Musculoskeletal: Muscles and extremities appear to be working normally. There are no significant problems with hand tremor, sweaty palms, palmar erythema, or lower leg swelling. Psychological: Mood is "pretty good", better recently.  Mental: The patient's abilities to think, to pay attention, to remember, and to make decisions are "fine".  GYN: Her LMP was  prior to her IUD insertion about November 2018.   PAST MEDICAL, FAMILY, AND SOCIAL HISTORY: 1. Work and family: She works full-time as a Writer at [employer]. 2. Activities: She is walking more often with her husband.    3. Primary care provider: Dr. Luetta Nutting at [facility] at [facility] (?) 4. OB-GYN: Dr. Aloha Gell, [facility], [phone]  3. REVIEW OF SYSTEMS: Ms. Teegarden has no significant problems related to any of her other body systems  PHYSICAL EXAM: There were no vitals taken for this visit.  Her weight at home was 184 pounds.   Constitutional: She looks healthy today. She has lost 5 pounds since her last visit.  Her affect and insight are normal.  Eyes: There is no arcus or proptosis.  Mouth: The oropharynx appears normal. The tongue appears normal. There is normal oral moisture. There is no obvious gingivitis. She has her usual mild grade I blond vellus mustache today.  Hands: There is no obvious tremor.    No flowsheet data found.   LABS:   Labs 08/12/19: TSH 8.87, free T4 1.7. free T3 3.2, thyroglobulin <0.1, thyroglobulin antibody <1  Labs 07/31/18: TSH 0.93, free T4 1.7, free  T3 3.7; thyroglobulin <0.1, thyroglobulin antibody <1  Labs 02/04/18: BMP normal; CBC normal except WBC 14.9 during an illness in which she passed  out with orthostasis due to dehydration.   Labs 04/04/17: TSH 0.62, free T4 1.3, free T3 2.7  Labs 01/07/18: TSH 0.81, free T4 1.6, free T3 3.0  Labs 09/26/17: TSH 5.02, free T4 1.2, free T3 2.6, thyroglobulin <0.1, thyroglobulin antibody <1  Labs 08/06/17: TSH 3.86, free T4 1.4, free T3 3.0  Labs 04/04/17: TSH 0.62, free T4 1.3, free T3 2.7  Labs 01/09/17: TSH 0.22, free T4 1.4, free T3 3.0, thyroglobulin <0.1, anti-thyroglobulin antibody <1  Labs 10/30/16: TSH 0.70, T4 7.6 (ref 4.5-12), T3 142 (ref 71-180); TSI <0.1 IU/L  Labs 06/27/16: TSH 5.43, free T4 1.3, free T3 3.2  Labs: 03/15/16: TSH 7.68, free T4 1.6, free T3  3.1  Labs 01/25/16: TSH 8.39, free T4 1.2, free T3 3.0  Labs 10/26/15: TSH 0.96, free T4 1.2, free T3 2.8, thyroglobulin <0.1, anti-thyroglobulin antibody <1   Labs 07/10/15: TSH 0.190, free T4 2.4, free T3 3.4  Labs 05/03/15: TSH 0.162, free T4 1.95, free T3 3.2, thyroglobulin (Tg) < 0.1, anti-Tg antibody < 1  Labs 01/22/15: TSH 0.529, free T4 1.29, free T3 2.8  Labs 12/16/14: TSH 0.811, free T4 1.00, free T3 2.4  Labs 11/05/14: TSH 0.207, free T4 1.17, free T3 2.7  Labs 08/08/13: TSH 2.325, free T4 1.08, free T3 2.9  Labs 05/30/14: Thyroglobulin < 0.1, anti-thyroglobulin antibody <1, TPO antibody 37, TSH 0.209, free T4 1.29, free T3 3.5  Labs 11/19/13: TSH 0.217, free T4 1.73, free T3 3.4, thyroglobulin <0.2, thyroglobulin antibody < 20  Labs 09/18/13: TSH 0.137, free T4 1.70, free T3 3.4  Labs 05/30/13: TSH 1.422, free T4 1.51, free T3 3.1, thyroglobulin <0.2, thyroglobulin antibody <20  Labs 02/19/13: TSH 1.734, free T4 1.35, free T3 3.2, TPO antibody 40.5, Tg antibody < 20, thyroglobulin < 0.2  Labs 10/01/12: TSH 0.312, free T4 1.66, free T3 3.0, TPO antibody 44.2, thyroglobulin < 0.2, anti-thyroglobulin antibody < 20.  Labs 10/23/11: TSH 0.214, free T4 1.54, free T3 3.2, thyroglobulin < 0.2, anti-thyroglobulin antibody < 20  Labs 08/21/11: TSH 17.234, free T4 1.73, free T3 3.3, Thyrogen-stimulated thyroglobulin (Tg) < 0.2 (undetectable). Anti-thyroglobulin antibody < 20 (normal).  Labs 04/20/11: Thyroglobulin (Tg) < 0.2 (undetectable), anti-thyroglobulin antibody (Tg Ab) < 20   Labs 04/04/11: TSH 0.154, free T4 1.57, free T3 3.1  Labs 08/19/10: Thyroglobulin <0.2. Thyroglobulin antibody <20.  ASSESSMENT: 1. Papillary thyroid cancer:   A. Her exam at her last visit was normal. There was still no clinical or lab evidence of recurrence.   B. As noted above, her Thyrogen-stimulated thyroglobulin (Tg) levels and unstimulated Tg levels have been unmeasurable since March 2012. She has not  had any chemical evidence of recurrence.   C. Today she still does not show any signs of clinical recurrence. Her Tg panel on 08/12/19 also showed no signs of recurrence.   D. At this point, 9 years after her I-131 therapy, it appears that Ms. Upham' PTC is in remission and is probably cured. She will need continuing follow-up of her PTC annually, but may need adjustment of her Synthroid dose every 6 months.   D. Although Ms. Silman still needs Synthroid therapy, we can allow her TSH to increase to the 0.50-2.0 range.  2. Hypothyroid/hyperthyroid:   A. Her TSH had been suppressed iatrogenically since 2012, but was elevated from August 2017 to January 2018.  Her TFTs have fluctuated several times and we have adjusted her Synthroid doses accordingly. Her last dose change was in April 2019. Her  TFTs in July 2019 were good.   B. She was hypothyroid in March 2021. She is not aware of any conflicting medication. We increased her Synthroid dosage.  3. Fatigue: Her fatigue has improved increasing her Synthroid dosage.  4. Obesity: She has lost weight recently.   5. Hirsutism: Her fat cell weight has caused some increase in insulin resistance and a compensatory increase in insulin production. The hyperinsulinemia, in turn, has caused more production of testosterone from her ovaries and androstenedione from her adrenal glands. Reducing her fat cell volume should result in reducing her androgen levels.      PLAN: 1. Diagnostic: I reviewed her recent TFTs and Tg panel today. We will repeat her TFTs in late May. We will then repeat the TFTs and Tg panel in one year, or sooner if indicated.  2. Therapeutic: Continue the Synthroid dosage of 125 mcg daily for 4 days each week and 250 mcg/day for three days each week. Adjust the Synthroid doses in the future as needed.  3. Patient education: We discussed the issue of maintaining her TSH in the lower half of the normal range as a preventive measure.  4. Follow-up:  Follow-up appointment in 12 months.   Level of Service: This visit lasted in excess of 50 minutes. More than 50% of the visit was devoted to counseling.  Sherrlyn Hock, MD, CDE Adult and Pediatric Endocrinology   This is a Pediatric Specialist E-Visit follow up consult provided via [place]. Josselyn Harkins Lautner and her husband consented to an E-Visit consult today.  Location of patient: Gennell and her husband are at their home.  Location of provider: Tillman Sers, MD is at his office. Patient was referred by Luetta Nutting, DO   The following participants were involved in this E-Visit: Stanton Kidney, her husband, and Dr. Tobe Sos.  Chief Complain/ Reason for E-Visit today: Thyroid cancer, surgical hypothyroidism, obesity Total time on call: 40 minutes Follow up: one year

## 2019-09-24 NOTE — Patient Instructions (Signed)
Follow up visit in one year. Please repeat lab tests in two months and 2 weeks prior to next visit.

## 2019-12-09 ENCOUNTER — Other Ambulatory Visit: Payer: Self-pay

## 2019-12-09 ENCOUNTER — Encounter: Payer: Self-pay | Admitting: Family Medicine

## 2019-12-09 ENCOUNTER — Ambulatory Visit (INDEPENDENT_AMBULATORY_CARE_PROVIDER_SITE_OTHER): Payer: 59 | Admitting: Family Medicine

## 2019-12-09 DIAGNOSIS — Z0000 Encounter for general adult medical examination without abnormal findings: Secondary | ICD-10-CM | POA: Diagnosis not present

## 2019-12-09 NOTE — Assessment & Plan Note (Signed)
Well adult She will continue to see Dr. Tobe Sos for thyroid management.  Immunizations: UTD Screening:  Reports pap is up to date, records requested.  Anticipatory Guidance/Risk factor reduction:  See AVS

## 2019-12-09 NOTE — Patient Instructions (Signed)

## 2019-12-09 NOTE — Progress Notes (Signed)
Betty Reyes - 44 y.o. female MRN [id number]  Date of birth: 1976-04-20  Subjective Chief Complaint  Patient presents with  . Annual Exam    HPI Betty Reyes is a 44 y.o. female with history of thyroid cancer now with hypothyroidism and Meniere's disease here today for annual exam.    She typically has FMLA paperwork completed annually for time missed due to Meniere's flares, this is due again in August.    She is a non-smoker and denies EtOH use  She does walk for exercise.  She feels that she follows a pretty healthy diet.   Review of Systems  Constitutional: Negative for chills, fever, malaise/fatigue and weight loss.  HENT: Negative for congestion, ear pain and sore throat.   Eyes: Negative for blurred vision, double vision and pain.  Respiratory: Negative for cough and shortness of breath.   Cardiovascular: Negative for chest pain and palpitations.  Gastrointestinal: Negative for abdominal pain, blood in stool, constipation, heartburn and nausea.  Genitourinary: Negative for dysuria and urgency.  Musculoskeletal: Negative for joint pain and myalgias.  Neurological: Negative for dizziness and headaches.  Endo/Heme/Allergies: Does not bruise/bleed easily.  Psychiatric/Behavioral: Negative for depression. The patient is not nervous/anxious and does not have insomnia.     No Known Allergies  Past Medical History:  Diagnosis Date  . Bradycardia   . Cancer ([place])   . Cervical polyp   . Fatigue   . History of radioactive iodine thyroid ablation   . Hypothyroidism, postop   . Obesity   . Postpartum care following vaginal delivery (9/6) 02/16/2015  . Thyroid cancer ([place])   . Thyroid disease    HYPOTHYROIDISM    Past Surgical History:  Procedure Laterality Date  . DILATION AND CURETTAGE OF UTERUS N/A 02/25/2017   Procedure: DILATATION AND CURETTAGE;  Surgeon: Charyl Bigger, MD;  Location: [place];  Service: Gynecology;  Laterality: N/A;  . TOTAL  THYROIDECTOMY      Social History   Socioeconomic History  . Marital status: Married    Spouse name: Not on file  . Number of children: Not on file  . Years of education: Not on file  . Highest education level: Not on file  Occupational History  . Not on file  Tobacco Use  . Smoking status: Never Smoker  . Smokeless tobacco: Never Used  Vaping Use  . Vaping Use: Never used  Substance and Sexual Activity  . Alcohol use: No  . Drug use: No  . Sexual activity: Yes    Partners: Male  Other Topics Concern  . 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   Social Determinants of Health   Financial Resource Strain:   . Difficulty of Paying Living Expenses:   Food Insecurity:   . Worried About Charity fundraiser in the Last Year:   . Arboriculturist in the Last Year:   Transportation Needs:   . Film/video editor (Medical):   [name] Lack of Transportation (Non-Medical):   Physical Activity:   . Days of Exercise per Week:   . Minutes of Exercise per Session:   Stress:   . Feeling of Stress :   Social Connections:   . Frequency of Communication with Friends and Family:   . Frequency of Social Gatherings with Friends and Family:   . Attends Religious Services:   . Active  Member of Clubs or Organizations:   . Attends Archivist Meetings:   [name] Marital Status:     Family History  Problem Relation Age of Onset  . Cancer Mother   . Hypertension Father   . Hyperlipidemia Father   . Heart disease Father        HEART ATTACK  . Heart attack Father   . Cancer Brother   . Diabetes Maternal Grandfather   . Hypothyroidism Paternal Grandmother   . Heart attack Paternal Grandmother   . Hypertension Paternal Grandmother   . Heart attack Paternal Uncle   . Hypertension Paternal Uncle   . Heart attack Paternal Grandfather   . Hypertension Paternal Grandfather     Health  Maintenance  Topic Date Due  . Hepatitis C Screening  Never done  . PAP SMEAR-Modifier  06/13/2019  . COVID-19 Vaccine (1) 12/24/2020 (Originally 08/16/1989)  . INFLUENZA VACCINE  01/11/2020  . TETANUS/TDAP  06/12/2026  . HIV Screening  Completed     ----------------------------------------------------------------------------------------------------------------------------------------------------------------------------------------------------------------- Physical Exam BP 121/71 (BP Location: Left Arm, Patient Position: Sitting, Cuff Size: Normal)   Pulse 65   Temp 97.8 F (36.6 C) (Temporal)   Ht 5' 7.72" (1.72 m)   Wt 187 lb 14.4 oz (85.2 kg)   SpO2 98%   BMI 28.81 kg/m   Physical Exam Constitutional:      General: She is not in acute distress. HENT:     Head: Normocephalic and atraumatic.     Nose: Nose normal.  Eyes:     General: No scleral icterus.    Conjunctiva/sclera: Conjunctivae normal.  Neck:     Thyroid: No thyromegaly.  Cardiovascular:     Rate and Rhythm: Normal rate and regular rhythm.     Heart sounds: Normal heart sounds.  Pulmonary:     Effort: Pulmonary effort is normal.     Breath sounds: Normal breath sounds.  Abdominal:     General: Bowel sounds are normal. There is no distension.     Palpations: Abdomen is soft.     Tenderness: There is no abdominal tenderness. There is no guarding.  Musculoskeletal:        General: Normal range of motion.     Cervical back: Normal range of motion and neck supple.  Lymphadenopathy:     Cervical: No cervical adenopathy.  Skin:    General: Skin is warm and dry.     Findings: No rash.  Neurological:     Mental Status: She is alert and oriented to person, place, and time.     Cranial Nerves: No cranial nerve deficit.     Coordination: Coordination normal.  Psychiatric:        Behavior: Behavior normal.      ------------------------------------------------------------------------------------------------------------------------------------------------------------------------------------------------------------------- Assessment and Plan  Well adult exam Well adult She will continue to see Dr. Tobe Sos for thyroid management.  Immunizations: UTD Screening:  Reports pap is up to date, records requested.  Anticipatory Guidance/Risk factor reduction:  See AVS    No orders of the defined types were placed in this encounter.   No follow-ups on file.    This visit occurred during the SARS-CoV-2 public health emergency.  Safety protocols were in place, including screening questions prior to the visit, additional usage of staff PPE, and extensive cleaning of exam room while observing appropriate contact time as indicated for disinfecting solutions.

## 2019-12-15 ENCOUNTER — Other Ambulatory Visit: Payer: Self-pay | Admitting: Family Medicine

## 2019-12-15 MED FILL — SYNTHROID 125 MCG TABLET: 125 | 90 days supply | Qty: 110 | Fill #2

## 2019-12-16 MED FILL — ONDANSETRON ODT 8 MG TABLET: 8 | 10 days supply | Qty: 30 | Fill #0

## 2020-01-06 DIAGNOSIS — E890 Postprocedural hypothyroidism: Secondary | ICD-10-CM | POA: Diagnosis not present

## 2020-01-06 DIAGNOSIS — L680 Hirsutism: Secondary | ICD-10-CM | POA: Diagnosis not present

## 2020-01-06 DIAGNOSIS — C73 Malignant neoplasm of thyroid gland: Secondary | ICD-10-CM | POA: Diagnosis not present

## 2020-01-08 ENCOUNTER — Encounter: Payer: 59 | Admitting: Family Medicine

## 2020-01-10 LAB — T3, FREE: T3, Free: 4 pg/mL (ref 2.3–4.2)

## 2020-01-10 LAB — TESTOS,TOTAL,FREE AND SHBG (FEMALE)
Free Testosterone: 2.2 pg/mL (ref 0.1–6.4)
Sex Hormone Binding: 60 nmol/L (ref 17–124)
Testosterone, Total, LC-MS-MS: 22 ng/dL (ref 2–45)

## 2020-01-10 LAB — DHEA-SULFATE: DHEA-SO4: 135 ug/dL (ref 19–231)

## 2020-01-10 LAB — TSH: TSH: 0.65 mIU/L

## 2020-01-10 LAB — ANDROSTENEDIONE: Androstenedione: 85 ng/dL

## 2020-01-10 LAB — T4, FREE: Free T4: 2 ng/dL — ABNORMAL HIGH (ref 0.8–1.8)

## 2020-01-14 ENCOUNTER — Encounter: Payer: Self-pay | Admitting: Family Medicine

## 2020-01-14 NOTE — Telephone Encounter (Signed)
Covid exemption information sent to patient.

## 2020-01-19 ENCOUNTER — Encounter: Payer: Self-pay | Admitting: Family Medicine

## 2020-02-03 ENCOUNTER — Encounter (INDEPENDENT_AMBULATORY_CARE_PROVIDER_SITE_OTHER): Payer: Self-pay | Admitting: *Deleted

## 2020-02-05 NOTE — Telephone Encounter (Signed)
Wrong pool before, see note below.

## 2020-02-05 NOTE — Telephone Encounter (Signed)
Spoke with husband, Leroy Sea, he paid the fee over the phone and papers are place in the mail bag ready to be mailed off. AM

## 2020-02-05 NOTE — Telephone Encounter (Signed)
Please call patient to collect payment for forms.

## 2020-03-29 MED FILL — SYNTHROID 125 MCG TABLET: 125 | 90 days supply | Qty: 110 | Fill #3

## 2020-03-31 ENCOUNTER — Other Ambulatory Visit (HOSPITAL_COMMUNITY): Payer: Self-pay | Admitting: Internal Medicine

## 2020-03-31 MED FILL — FLUARIX QUADRIVALENT 0.5 ML: 0.5 | 1 days supply | Qty: 1 | Fill #0

## 2020-06-30 ENCOUNTER — Telehealth: Payer: Self-pay | Admitting: Family Medicine

## 2020-06-30 ENCOUNTER — Encounter: Payer: Self-pay | Admitting: Family Medicine

## 2020-06-30 NOTE — Telephone Encounter (Signed)
Patient dropped off FMLA paperwork (06/30/20) to be filled out. Patient stated that she has been seen for this issue before. Patient was told PCP is out of the office for the rest of the week. Billing sheet attached to the paperwork and placed in provider box. AM

## 2020-07-09 NOTE — Telephone Encounter (Signed)
Form has been faxed to [employer] and given to [employer] for scanning to chart.

## 2020-07-09 NOTE — Telephone Encounter (Signed)
Form completed and placed in assistant's box

## 2020-07-12 NOTE — Telephone Encounter (Signed)
Patient notified paperwork is ready to be picked up and there is a 29$ fee. AM

## 2020-08-03 ENCOUNTER — Other Ambulatory Visit (INDEPENDENT_AMBULATORY_CARE_PROVIDER_SITE_OTHER): Payer: Self-pay | Admitting: "Endocrinology

## 2020-08-03 DIAGNOSIS — E890 Postprocedural hypothyroidism: Secondary | ICD-10-CM

## 2020-08-04 ENCOUNTER — Other Ambulatory Visit (INDEPENDENT_AMBULATORY_CARE_PROVIDER_SITE_OTHER): Payer: Self-pay

## 2020-08-04 ENCOUNTER — Other Ambulatory Visit (HOSPITAL_COMMUNITY): Payer: Self-pay | Admitting: "Endocrinology

## 2020-08-04 MED ORDER — LEVOTHYROXINE SODIUM 125 MCG PO CAPS: ORAL_CAPSULE | ORAL | 2 refills | Status: DC

## 2020-08-04 MED FILL — SYNTHROID 125 MCG TABLET: 125 | 28 days supply | Qty: 36 | Fill #0

## 2020-08-16 ENCOUNTER — Other Ambulatory Visit (INDEPENDENT_AMBULATORY_CARE_PROVIDER_SITE_OTHER): Payer: Self-pay

## 2020-08-16 ENCOUNTER — Other Ambulatory Visit (INDEPENDENT_AMBULATORY_CARE_PROVIDER_SITE_OTHER): Payer: Self-pay | Admitting: "Endocrinology

## 2020-08-16 ENCOUNTER — Telehealth (INDEPENDENT_AMBULATORY_CARE_PROVIDER_SITE_OTHER): Payer: Self-pay

## 2020-08-16 DIAGNOSIS — E890 Postprocedural hypothyroidism: Secondary | ICD-10-CM

## 2020-08-16 MED ORDER — LEVOTHYROXINE SODIUM 125 MCG PO TABS: ORAL_TABLET | ORAL | 5 refills | Status: DC

## 2020-08-16 NOTE — Telephone Encounter (Signed)
Spoke with pharmacy. Verified directions of Synthroid. 125 mcg 5 days a week 250 mcg 2 days a week

## 2020-09-08 ENCOUNTER — Other Ambulatory Visit (INDEPENDENT_AMBULATORY_CARE_PROVIDER_SITE_OTHER): Payer: Self-pay | Admitting: "Endocrinology

## 2020-09-08 DIAGNOSIS — E890 Postprocedural hypothyroidism: Secondary | ICD-10-CM

## 2020-09-08 DIAGNOSIS — C73 Malignant neoplasm of thyroid gland: Secondary | ICD-10-CM

## 2020-09-09 LAB — T3, FREE: T3, Free: 2.4 pg/mL (ref 2.3–4.2)

## 2020-09-09 LAB — TSH: TSH: 16.79 mIU/L — ABNORMAL HIGH

## 2020-09-09 LAB — THYROGLOBULIN LEVEL: Thyroglobulin: <0.1 ng/mL — ABNORMAL LOW

## 2020-09-09 LAB — T4, FREE: Free T4: 1.2 ng/dL (ref 0.8–1.8)

## 2020-09-09 LAB — THYROGLOBULIN ANTIBODY: Thyroglobulin Ab: <1 IU/mL (ref <=1)

## 2020-09-15 ENCOUNTER — Ambulatory Visit (INDEPENDENT_AMBULATORY_CARE_PROVIDER_SITE_OTHER): Payer: 59 | Admitting: "Endocrinology

## 2020-09-15 ENCOUNTER — Other Ambulatory Visit: Payer: Self-pay

## 2020-09-15 ENCOUNTER — Encounter (INDEPENDENT_AMBULATORY_CARE_PROVIDER_SITE_OTHER): Payer: Self-pay | Admitting: "Endocrinology

## 2020-09-15 VITALS — BP 128/70 | HR 64 | Wt 187.0 lb

## 2020-09-15 DIAGNOSIS — E661 Drug-induced obesity: Secondary | ICD-10-CM | POA: Diagnosis not present

## 2020-09-15 DIAGNOSIS — E890 Postprocedural hypothyroidism: Secondary | ICD-10-CM

## 2020-09-15 DIAGNOSIS — C73 Malignant neoplasm of thyroid gland: Secondary | ICD-10-CM

## 2020-09-15 DIAGNOSIS — R5383 Other fatigue: Secondary | ICD-10-CM | POA: Diagnosis not present

## 2020-09-15 LAB — POCT GLUCOSE (DEVICE FOR HOME USE): POC Glucose: 90 mg/dl (ref 70–99)

## 2020-09-15 LAB — POCT GLYCOSYLATED HEMOGLOBIN (HGB A1C): Hemoglobin A1C: 5.2 % (ref 4.0–5.6)

## 2020-09-15 MED FILL — Levothyroxine Sodium Tab 125 MCG: ORAL | 23 days supply | Qty: 30 | Fill #0 | Status: CN

## 2020-09-15 NOTE — Patient Instructions (Addendum)
Follow up visit in 2 months. Change Synthroid dosage to 125 mcg/day for 4 days each week and 250 mcg/day for three days each week, for example Monday-Wednesday-Friday. Please repeat lab tests in 2 months and again 2 weeks prior to next visit.

## 2020-09-15 NOTE — Progress Notes (Signed)
CC: FU papillary thyroid cancer, post-operative hypothyroidism, iatrogenic hyperthyroidism, fatigue, bradycardia, overweight/obesity, recent pregnancy.  A. HPI: [name] is a 45 y.o. Caucasian woman who is accompanied by her husband today.  1. [name] was diagnosed with hypothyroidism, presumably secondary to Hashimoto's Disease, at some time prior to 2010. She was treated with Synthroid, 50 mcg per day. In the Summer of 2010 a goiter was noted.  A thyroid US showed multiple nodules bilaterally, with the largest being 2.6 cm in diameter. [name] was referred to Dr. Stark Klein, a local general surgeon, for further evaluation and management. Dr. Barry Dienes performed a fine needle aspiration, which reportedly showed Hashimoto's Disease. A follow-up thyroid US in the Summer of 2011 showed that the previously aspirated nodule and two other nodules had enlarged during the year. Dr. Barry Dienes performed a total thyroidectomy on 05/19/10. 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 The largest microfocus was in the superior right lobe, measured 4 mm in diameter, invaded the capsule, but did not extend extrathyroidally.    2. I saw the patient and her husband in consultation on 121/16/11. Clinically she showed no evidence of active thyroid cancer. I reviewed her surgical notes and pathology reports with the patient and her husband. I also discussed with them the latest clinical guidelines on the management of thyroid cancer from the American Thy[employer] Association. We discussed the options for therapy, including whether or not to give radioactive iodine therapy (RAI). Although microfocal cancers usually have a low risk of recurrence or death, her case had two factors that would potentially increase her risks. First, the fact that she had multiple foci in both lobes. Second, the fact that one microfocus did invade the thyroid capsule and  might have spread extrathyroidally, although the pathologist did not see evidence of extrathyroidal extension on the slides he reviewed.  I asked the patient to take oral contraceptives from that point until she either decided not to take RAI or until at least one year after receiving RAI. She agreed.  3.  On 06/14/10 I met with [name] and her husband again and we had another full and open discussion about the advantages and disadvantages of using RAI. Since the Hennises did not yet have children, and since neither of them wanted to leave a cancer untreated when a potentially curative treatment such as RAI was available, they wanted to proceed with RAI as soon as possible. Since Ms. Yarberry had not been on thyroid hormone replacement since surgery, she chose to receive RAI while undergoing thyroid hormone withdrawal and while following a strict low iodine diet. On 06/27/10, [name] received 50 millicuries of D-408. Her post-RAI scan showed uptake in the thyroid bed, but no spread outside the thyroid bed. As I explained to the Hennises, the I-131 uptake could have been due to the presence of some residual thyroid cells, or to some thyroid cancer cells, or both.   4. Clinical Course: The patient has done well clinically since her I-131 therapy. She has had no evidence of recurrence on serial physical exams. Her thyroglobulin (Tg) levels have progressively decreased. On 06/01/10 the post-operative Tg level was 20.8. On 06/14/10 the pre-I-131 Tg level was 2.9. She was hypothyroid for both of these Tg values. On 09/08/10 the post-I-131 Tg level was <0.2. At that point [name] was taking Synthroid, 175 mcg/day. Her unstimulated thyroglobulin levels have remained < 0.2 ever since.  5. [name] last televisit with me was on 09/24/19. I continued her Synthroid dose of 125 mcg/day for 4 days per week and 250 mcg/day for 3 days each week. After reviewing her TFTs from early July 2021, however, I changed her  dosage to 125 mcg/day on 5 days each week and 250 mcg/day on 2 days per week. She began this new dosage on 02/02/20.   A. In the interim she has been healthy, but has been more fatigued, colder, and has less energy.   B. She has been taking the Synthroid as prescribed. She takes the Synthroid by itself. She has been taking the Synthroid very reliably.   C. Her allergies have been acting up in the past week.  but have not been terrible. She is having some mild vertigo this week. She rarely needs to take meclizine. She has been diagnosed with Meniere's' disease in the past.   6. Pertinent Review of Systems: Constitutional. The patient feels "good, but tired and colder". She has no other significant complaints that pertain to today's visit.  Sleep: She has had nocturia 2-3 times per night, nothing new. Body temperature: The patient's body temperature is colder.   Weight: Weight has decreased by 2 pounds.   Eyes: The patient's vision is good. She is not aware of any eye problems. Neck: The patient is not aware of any lumps or masses in her anterior neck and thyroid bed.  There have been no significant other problems with  pressure, discomfort, or difficulty swallowing. Heart: The patient feels the expected increase in heart rate during exercise or other physical activities. There have been no significant problems with palpitations, irregular heart beats, chest pain, or chest pressure. Gastrointestinal: There are no significant complaints of excessive hunger, upset stomach, stomach aches or pains, diarrhea, or constipation. Musculoskeletal: Muscles and extremities appear to be working normally. There are no significant problems with hand tremor, sweaty palms, palmar erythema, or lower leg swelling. Psychological: Mood is "pretty good", better recently.  Mental: The patient's abilities to think, to pay attention, to remember, and to make decisions are "fine".  GYN: Her LMP was prior to her IUD insertion  about November 2018.   PAST MEDICAL, FAMILY, AND SOCIAL HISTORY: 1. Work and family: She works full-time as a Writer at [employer]. 2. Activities: She is walking more often with her husband.    3. Primary care provider: Dr. Luetta Nutting at [facility] (?) 4. OB-GYN: Dr. Aloha Gell, [facility], [phone]  3. REVIEW OF SYSTEMS: [name] has no significant problems related to any of her other body systems  PHYSICAL EXAM:BP 128/70 (BP Location: Right Arm, Patient Position: Sitting, Cuff Size: Normal)   Pulse 64   Wt 187 lb (84.8 kg)   BMI 28.67 kg/m    Constitutional: She looks healthy today. She has lost two pounds in two years.  Her affect and insight are normal.  Eyes: There is no arcus or proptosis.  Mouth: The oropharynx appears normal. The tongue appears normal. There is normal oral moisture. There is no obvious gingivitis. She has her usual mild grade I blond vellus mustache today.  Neck: The neck appears to be visibly normal. No carotid bruits are noted. The thyroid gland is absent. I do not palpate any excess tissue in her thyroid bed, posterior cervical area, lateral neck, or supraclavicular areas.   Lungs: The lungs are clear to auscultation. Air movement is good. Heart: Heart rate and rhythm are regular.Heart  sounds S1 and S2 are normal. I did not appreciate any pathologic cardiac murmurs. Abdomen: The abdomen is enlarged. Bowel sounds are normal. There is no obvious hepatomegaly, splenomegaly, or other mass effect.  Arms: Muscle size and bulk are normal for age. Hands: There is no obvious tremor. Phalangeal and metacarpophalangeal joints are normal. Palmar muscles are normal for age. Palmar skin is normal. Palmar moisture is also normal. Legs: Muscles appear normal for age. No edema is present. Neurologic: Strength is normal for age in both the upper and lower extremities. Muscle tone is normal. Sensation to touch is normal in both legs.      No flowsheet data found.   LABS:  Labs 09/15/20: HbA1c 5.2%  Labs 09/08/20: TSH 16.79, free T4 1.2, free T3 2.4, thyroglobulin <0.1, thyroglobulin antibody <1   Labs 01/06/20: TSH 0.65 , free T4 2.0, free T3 4.0; DHEAS 135 (ref 19-231)  Labs 08/12/19: TSH 8.87, free T4 1.7. free T3 3.2, thyroglobulin <0.1, thyroglobulin antibody <1  Labs 07/31/18: TSH 0.93, free T4 1.7, free  T3 3.7; thyroglobulin <0.1, thyroglobulin antibody <1  Labs 02/04/18: BMP normal; CBC normal except WBC 14.9 during an illness in which she passed out with orthostasis due to dehydration.   Labs 04/04/17: TSH 0.62, free T4 1.3, free T3 2.7  Labs 01/07/18: TSH 0.81, free T4 1.6, free T3 3.0  Labs 09/26/17: TSH 5.02, free T4 1.2, free T3 2.6, thyroglobulin <0.1, thyroglobulin antibody <1  Labs 08/06/17: TSH 3.86, free T4 1.4, free T3 3.0  Labs 04/04/17: TSH 0.62, free T4 1.3, free T3 2.7  Labs 01/09/17: TSH 0.22, free T4 1.4, free T3 3.0, thyroglobulin <0.1, anti-thyroglobulin antibody <1  Labs 10/30/16: TSH 0.70, T4 7.6 (ref 4.5-12), T3 142 (ref 71-180); TSI <0.1 IU/L  Labs 06/27/16: TSH 5.43, free T4 1.3, free T3 3.2  Labs: 03/15/16: TSH 7.68, free T4 1.6, free T3 3.1  Labs 01/25/16: TSH 8.39, free T4 1.2, free T3 3.0  Labs 10/26/15: TSH 0.96, free T4 1.2, free T3 2.8, thyroglobulin <0.1, anti-thyroglobulin antibody <1   Labs 07/10/15: TSH 0.190, free T4 2.4, free T3 3.4  Labs 05/03/15: TSH 0.162, free T4 1.95, free T3 3.2, thyroglobulin (Tg) < 0.1, anti-Tg antibody < 1  Labs 01/22/15: TSH 0.529, free T4 1.29, free T3 2.8  Labs 12/16/14: TSH 0.811, free T4 1.00, free T3 2.4  Labs 11/05/14: TSH 0.207, free T4 1.17, free T3 2.7  Labs 08/08/13: TSH 2.325, free T4 1.08, free T3 2.9  Labs 05/30/14: Thyroglobulin < 0.1, anti-thyroglobulin antibody <1, TPO antibody 37, TSH 0.209, free T4 1.29, free T3 3.5  Labs 11/19/13: TSH 0.217, free T4 1.73, free T3 3.4, thyroglobulin <0.2, thyroglobulin antibody < 20  Labs  09/18/13: TSH 0.137, free T4 1.70, free T3 3.4  Labs 05/30/13: TSH 1.422, free T4 1.51, free T3 3.1, thyroglobulin <0.2, thyroglobulin antibody <20  Labs 02/19/13: TSH 1.734, free T4 1.35, free T3 3.2, TPO antibody 40.5, Tg antibody < 20, thyroglobulin < 0.2  Labs 10/01/12: TSH 0.312, free T4 1.66, free T3 3.0, TPO antibody 44.2, thyroglobulin < 0.2, anti-thyroglobulin antibody < 20.  Labs 10/23/11: TSH 0.214, free T4 1.54, free T3 3.2, thyroglobulin < 0.2, anti-thyroglobulin antibody < 20  Labs 08/21/11: TSH 17.234, free T4 1.73, free T3 3.3, Thyrogen-stimulated thyroglobulin (Tg) < 0.2 (undetectable). Anti-thyroglobulin antibody < 20 (normal).  Labs 04/20/11: Thyroglobulin (Tg) < 0.2 (undetectable), anti-thyroglobulin antibody (Tg Ab) < 20   Labs 04/04/11: TSH 0.154, free T4 1.57, free T3  3.1  Labs 08/19/10: Thyroglobulin <0.2. Thyroglobulin antibody <20.  ASSESSMENT: 1. Papillary thyroid cancer:   A. Her exam at her last visit and at her visit today in April 2022 was normal. There was still no clinical or lab evidence of recurrence.   B. As noted above, her Thyrogen-stimulated thyroglobulin (Tg) levels and unstimulated Tg levels have been unmeasurable since March 2012. She has not had any chemical evidence of recurrence.   C. Today she still does not show any signs of clinical recurrence. Her Tg panel on 08/12/19 and on 09/08/20 also showed no signs of recurrence.   D. At this point, 10 years after her I-131 therapy, it appears that [name]' PTC is in remission and is probably cured. She will need continuing follow-up of her PTC annually, but may need adjustment of her Synthroid dose every 6 months.   D. Although [name] still needs Synthroid therapy, we can allow her TSH to increase to the 0.50-2.0 range.  2. Hypothyroid/hyperthyroid:   A. Her TSH had been suppressed iatrogenically since 2012, but was elevated from August 2017 to January 2018.  Her TFTs have fluctuated several times and  we have adjusted her Synthroid doses accordingly.   B. She was hypothyroid in March 20212 She is not aware of any conflicting medication. We increased her Synthroid dosage.  3. Fatigue: Her fatigue has worsened due to her hypothyroidism.  4. Obesity: She has lost a small amount of weight since her last visits.   5. Hirsutism: Her fat cell weight has caused some increase in insulin resistance and a compensatory increase in insulin production. The hyperinsulinemia, in turn, has caused more production of testosterone from her ovaries and androstenedione from her adrenal glands. Reducing her fat cell volume should result in reducing her androgen levels.      PLAN: 1. Diagnostic: I reviewed her recent TFTs and Thyroglobulin panel today. We will repeat her TFTs in late June. We will then repeat the TFTs and Tg panel in one year, or sooner if indicated.  2. Therapeutic: Change the Synthroid dosage of 125 mcg daily for 4 days each week and 250 mcg/day for three days each week. Adjust the Synthroid doses in the future as needed.  3. Patient education: We discussed the issue of maintaining her TSH in the lower half of the normal range as a preventive measure.  4. Follow-up: Follow-up appointment in 12 months.   Level of Service: This visit lasted in excess of 50 minutes. More than 50% of the visit was devoted to counseling.  Sherrlyn Hock, MD, CDE Adult and Pediatric Endocrinology

## 2020-09-16 ENCOUNTER — Other Ambulatory Visit (HOSPITAL_COMMUNITY): Payer: Self-pay

## 2020-09-16 ENCOUNTER — Telehealth (INDEPENDENT_AMBULATORY_CARE_PROVIDER_SITE_OTHER): Payer: Self-pay | Admitting: "Endocrinology

## 2020-09-16 DIAGNOSIS — E890 Postprocedural hypothyroidism: Secondary | ICD-10-CM

## 2020-09-16 MED ORDER — SYNTHROID 125 MCG PO TABS
ORAL_TABLET | ORAL | 5 refills | Status: DC
  Filled 2020-09-16 (×2): qty 120, 90d supply, fill #0
  Filled 2021-01-04: qty 120, 90d supply, fill #1
  Filled 2021-05-01: qty 120, 84d supply, fill #2
  Filled 2021-08-21: qty 120, 84d supply, fill #3

## 2020-09-16 NOTE — Telephone Encounter (Signed)
  Who's calling (name and relationship to patient) : Novie (self)  Best contact number: [phone]  Provider they see: Dr. Tobe Sos  Reason for call: Patient states that her prescription did not get sent to the pharmacy yesterday. She is also requesting 90 day supply.    PRESCRIPTION REFILL ONLY  Name of prescription: levothyroxine (SYNTHROID) 125 MCG tablet Pharmacy: [facility]

## 2020-10-25 ENCOUNTER — Encounter: Payer: Self-pay | Admitting: Family Medicine

## 2020-10-25 ENCOUNTER — Ambulatory Visit (INDEPENDENT_AMBULATORY_CARE_PROVIDER_SITE_OTHER): Payer: 59 | Admitting: Family Medicine

## 2020-10-25 ENCOUNTER — Other Ambulatory Visit: Payer: Self-pay

## 2020-10-25 VITALS — BP 116/60 | HR 66 | Temp 97.8°F | Ht 67.13 in | Wt 193.2 lb

## 2020-10-25 DIAGNOSIS — Z0000 Encounter for general adult medical examination without abnormal findings: Secondary | ICD-10-CM

## 2020-10-25 DIAGNOSIS — Z13220 Encounter for screening for lipoid disorders: Secondary | ICD-10-CM | POA: Diagnosis not present

## 2020-10-25 DIAGNOSIS — H8103 Meniere's disease, bilateral: Secondary | ICD-10-CM

## 2020-10-25 NOTE — Assessment & Plan Note (Signed)
Well adult Orders Placed This Encounter  Procedures  . COMPLETE METABOLIC PANEL WITH GFR  . CBC with Differential  . Lipid Panel w/reflex Direct LDL  Screening: Lipid panel. Previous PAP report ordered.  Immunizations: UTD Anticipatory guidance/Risk factor reduction:  Recommendations per AVS.

## 2020-10-25 NOTE — Progress Notes (Signed)
Betty Reyes - 45 y.o. female MRN [id number]  Date of birth: 05-May-1976  Subjective Chief Complaint  Patient presents with  . Annual Exam    HPI Betty Reyes is a 45 y.o. female here today for annual exam.    She has history meniere's disease.  Flare ups a few times per year.  Has FMLA paperwork in place to cover time off.   She is followed by endocrinology for hypothyroidism.  Stable at this time.   Had pap in 2018.  Records requested.  She sees Administrator, sports.   She is a non-smoker and denies EtOH use.   She feels like diet is pretty healthy.   Review of Systems  Constitutional: Negative for chills, fever, malaise/fatigue and weight loss.  HENT: Negative for congestion, ear pain and sore throat.   Eyes: Negative for blurred vision, double vision and pain.  Respiratory: Negative for cough and shortness of breath.   Cardiovascular: Negative for chest pain and palpitations.  Gastrointestinal: Negative for abdominal pain, blood in stool, constipation, heartburn and nausea.  Genitourinary: Negative for dysuria and urgency.  Musculoskeletal: Negative for joint pain and myalgias.  Neurological: Negative for dizziness and headaches.  Endo/Heme/Allergies: Does not bruise/bleed easily.  Psychiatric/Behavioral: Negative for depression. The patient is not nervous/anxious and does not have insomnia.     No Known Allergies  Past Medical History:  Diagnosis Date  . Bradycardia   . Cancer ([place])   . Cervical polyp   . Fatigue   . History of radioactive iodine thyroid ablation   . Hypothyroidism, postop   . Obesity   . Postpartum care following vaginal delivery (9/6) 02/16/2015  . Thyroid cancer ([place])   . Thyroid disease    HYPOTHYROIDISM    Past Surgical History:  Procedure Laterality Date  . DILATION AND CURETTAGE OF UTERUS N/A 02/25/2017   Procedure: DILATATION AND CURETTAGE;  Surgeon: Charyl Bigger, MD;  Location: [place];  Service: Gynecology;   Laterality: N/A;  . TOTAL THYROIDECTOMY      Social History   Socioeconomic History  . Marital status: Married    Spouse name: Not on file  . Number of children: Not on file  . Years of education: Not on file  . Highest education level: Not on file  Occupational History  . Not on file  Tobacco Use  . Smoking status: Never Smoker  . Smokeless tobacco: Never Used  Vaping Use  . Vaping Use: Never used  Substance and Sexual Activity  . Alcohol use: No  . Drug use: No  . Sexual activity: Yes    Partners: Male  Other Topics Concern  . 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   Social Determinants of Health   Financial Resource Strain: Not on file  Food Insecurity: Not on file  Transportation Needs: Not on file  Physical Activity: Not on file  Stress: Not on file  Social Connections: Not on file    Family History  Problem Relation Age of Onset  . Cancer Mother   . Hypertension Father   . Hyperlipidemia Father   . Heart disease Father        HEART ATTACK  . Heart attack Father   . Cancer Brother   . Diabetes Maternal Grandfather   . Hypothyroidism Paternal Grandmother   . Heart attack Paternal Grandmother   . Hypertension Paternal Grandmother   .  Heart attack Paternal Uncle   . Hypertension Paternal Uncle   . Heart attack Paternal Grandfather   . Hypertension Paternal Grandfather     Health Maintenance  Topic Date Due  . Hepatitis C Screening  Never done  . PAP SMEAR-Modifier  06/13/2019  . COVID-19 Vaccine (1) 12/24/2020 (Originally 08/16/1989)  . INFLUENZA VACCINE  01/10/2021  . TETANUS/TDAP  06/12/2026  . HIV Screening  Completed  . HPV VACCINES  Aged Out      ----------------------------------------------------------------------------------------------------------------------------------------------------------------------------------------------------------------- Physical Exam BP 116/60 (BP Location: Left Arm, Patient Position: Sitting, Cuff Size: Large)   Pulse 66   Temp 97.8 F (36.6 C)   Ht 5' 7.13" (1.705 m)   Wt 193 lb 3.2 oz (87.6 kg)   SpO2 100%   BMI 30.15 kg/m   Physical Exam Constitutional:      General: She is not in acute distress. HENT:     Head: Normocephalic and atraumatic.     Nose: Nose normal.  Eyes:     General: No scleral icterus.    Conjunctiva/sclera: Conjunctivae normal.  Neck:     Thyroid: No thyromegaly.  Cardiovascular:     Rate and Rhythm: Normal rate and regular rhythm.     Heart sounds: Normal heart sounds.  Pulmonary:     Effort: Pulmonary effort is normal.     Breath sounds: Normal breath sounds.  Abdominal:     General: Bowel sounds are normal. There is no distension.     Palpations: Abdomen is soft.     Tenderness: There is no abdominal tenderness. There is no guarding.  Musculoskeletal:        General: Normal range of motion.     Cervical back: Normal range of motion and neck supple.  Lymphadenopathy:     Cervical: No cervical adenopathy.  Skin:    General: Skin is warm and dry.     Findings: No rash.  Neurological:     Mental Status: She is alert and oriented to person, place, and time.     Cranial Nerves: No cranial nerve deficit.     Coordination: Coordination normal.  Psychiatric:        Behavior: Behavior normal.     ------------------------------------------------------------------------------------------------------------------------------------------------------------------------------------------------------------------- Assessment and Plan  Well adult exam Well adult Orders Placed This Encounter  Procedures  . COMPLETE METABOLIC PANEL WITH GFR  . CBC with  Differential  . Lipid Panel w/reflex Direct LDL  Screening: Lipid panel. Previous PAP report ordered.  Immunizations: UTD Anticipatory guidance/Risk factor reduction:  Recommendations per AVS.     No orders of the defined types were placed in this encounter.   No follow-ups on file.    This visit occurred during the SARS-CoV-2 public health emergency.  Safety protocols were in place, including screening questions prior to the visit, additional usage of staff PPE, and extensive cleaning of exam room while observing appropriate contact time as indicated for disinfecting solutions.

## 2020-10-25 NOTE — Patient Instructions (Signed)
Preventive Care 84-43 Years Old, Female Preventive care refers to lifestyle choices and visits with your health care provider that can promote health and wellness. This includes:  A yearly physical exam. This is also called an annual wellness visit.  Regular dental and eye exams.  Immunizations.  Screening for certain conditions.  Healthy lifestyle choices, such as: ? Eating a healthy diet. ? Getting regular exercise. ? Not using drugs or products that contain nicotine and tobacco. ? Limiting alcohol use. What can I expect for my preventive care visit? Physical exam Your health care provider will check your:  Height and weight. These may be used to calculate your BMI (body mass index). BMI is a measurement that tells if you are at a healthy weight.  Heart rate and blood pressure.  Body temperature.  Skin for abnormal spots. Counseling Your health care provider may ask you questions about your:  Past medical problems.  Family's medical history.  Alcohol, tobacco, and drug use.  Emotional well-being.  Home life and relationship well-being.  Sexual activity.  Diet, exercise, and sleep habits.  Work and work Statistician.  Access to firearms.  Method of birth control.  Menstrual cycle.  Pregnancy history. What immunizations do I need? Vaccines are usually given at various ages, according to a schedule. Your health care provider will recommend vaccines for you based on your age, medical history, and lifestyle or other factors, such as travel or where you work.   What tests do I need? Blood tests  Lipid and cholesterol levels. These may be checked every 5 years, or more often if you are over 3 years old.  Hepatitis C test.  Hepatitis B test. Screening  Lung cancer screening. You may have this screening every year starting at age 73 if you have a 30-pack-year history of smoking and currently smoke or have quit within the past 15 years.  Colorectal cancer  screening. ? All adults should have this screening starting at age 52 and continuing until age 17. ? Your health care provider may recommend screening at age 49 if you are at increased risk. ? You will have tests every 1-10 years, depending on your results and the type of screening test.  Diabetes screening. ? This is done by checking your blood sugar (glucose) after you have not eaten for a while (fasting). ? You may have this done every 1-3 years.  Mammogram. ? This may be done every 1-2 years. ? Talk with your health care provider about when you should start having regular mammograms. This may depend on whether you have a family history of breast cancer.  BRCA-related cancer screening. This may be done if you have a family history of breast, ovarian, tubal, or peritoneal cancers.  Pelvic exam and Pap test. ? This may be done every 3 years starting at age 10. ? Starting at age 11, this may be done every 5 years if you have a Pap test in combination with an HPV test. Other tests  STD (sexually transmitted disease) testing, if you are at risk.  Bone density scan. This is done to screen for osteoporosis. You may have this scan if you are at high risk for osteoporosis. Talk with your health care provider about your test results, treatment options, and if necessary, the need for more tests. Follow these instructions at home: Eating and drinking  Eat a diet that includes fresh fruits and vegetables, whole grains, lean protein, and low-fat dairy products.  Take vitamin and mineral supplements  as recommended by your health care provider.  Do not drink alcohol if: ? Your health care provider tells you not to drink. ? You are pregnant, may be pregnant, or are planning to become pregnant.  If you drink alcohol: ? Limit how much you have to 0-1 drink a day. ? Be aware of how much alcohol is in your drink. In the [place] one drink equals one 12 oz bottle of beer (355 mL), one 5 oz glass of  wine (148 mL), or one 1 oz glass of hard liquor (44 mL).   Lifestyle  Take daily care of your teeth and gums. Brush your teeth every morning and night with fluoride toothpaste. Floss one time each day.  Stay active. Exercise for at least 30 minutes 5 or more days each week.  Do not use any products that contain nicotine or tobacco, such as cigarettes, e-cigarettes, and chewing tobacco. If you need help quitting, ask your health care provider.  Do not use drugs.  If you are sexually active, practice safe sex. Use a condom or other form of protection to prevent STIs (sexually transmitted infections).  If you do not wish to become pregnant, use a form of birth control. If you plan to become pregnant, see your health care provider for a prepregnancy visit.  If told by your health care provider, take low-dose aspirin daily starting at age 50.  Find healthy ways to cope with stress, such as: ? Meditation, yoga, or listening to music. ? Journaling. ? Talking to a trusted person. ? Spending time with friends and family. Safety  Always wear your seat belt while driving or riding in a vehicle.  Do not drive: ? If you have been drinking alcohol. Do not ride with someone who has been drinking. ? When you are tired or distracted. ? While texting.  Wear a helmet and other protective equipment during sports activities.  If you have firearms in your house, make sure you follow all gun safety procedures. What's next?  Visit your health care provider once a year for an annual wellness visit.  Ask your health care provider how often you should have your eyes and teeth checked.  Stay up to date on all vaccines. This information is not intended to replace advice given to you by your health care provider. Make sure you discuss any questions you have with your health care provider. Document Revised: 03/02/2020 Document Reviewed: 02/07/2018 Elsevier Patient Education  2021 Elsevier Inc.  

## 2020-11-16 ENCOUNTER — Encounter: Payer: Self-pay | Admitting: Family Medicine

## 2020-11-16 NOTE — Progress Notes (Signed)
Negative for intraepithelial lesion or malignancy.  

## 2020-12-09 DIAGNOSIS — E890 Postprocedural hypothyroidism: Secondary | ICD-10-CM | POA: Diagnosis not present

## 2020-12-09 DIAGNOSIS — Z0000 Encounter for general adult medical examination without abnormal findings: Secondary | ICD-10-CM | POA: Diagnosis not present

## 2020-12-09 DIAGNOSIS — Z13220 Encounter for screening for lipoid disorders: Secondary | ICD-10-CM | POA: Diagnosis not present

## 2020-12-09 LAB — LIPID PANEL W/REFLEX DIRECT LDL
Cholesterol: 150 mg/dL (ref <200)
HDL: 44 mg/dL — ABNORMAL LOW (ref >=50)
LDL Cholesterol (Calc): 93 mg/dL (calc)
Non-HDL Cholesterol (Calc): 106 mg/dL (calc) (ref <130)
Total CHOL/HDL Ratio: 3.4 (calc) (ref <5.0)
Triglycerides: 44 mg/dL (ref <150)

## 2020-12-09 LAB — CBC WITH DIFFERENTIAL/PLATELET
Absolute Monocytes: 456 cells/uL (ref 200–950)
Basophils Absolute: 42 cells/uL (ref 0–200)
Basophils Relative: 0.8 %
Eosinophils Absolute: 101 cells/uL (ref 15–500)
Eosinophils Relative: 1.9 %
HCT: 40.7 % (ref 35.0–45.0)
Hemoglobin: 13.4 g/dL (ref 11.7–15.5)
Lymphs Abs: 1373 cells/uL (ref 850–3900)
MCH: 30.2 pg (ref 27.0–33.0)
MCHC: 32.9 g/dL (ref 32.0–36.0)
MCV: 91.7 fL (ref 80.0–100.0)
MPV: 11.8 fL (ref 7.5–12.5)
Monocytes Relative: 8.6 %
Neutro Abs: 3328 cells/uL (ref 1500–7800)
Neutrophils Relative %: 62.8 %
Platelets: 207 10*3/uL (ref 140–400)
RBC: 4.44 10*6/uL (ref 3.80–5.10)
RDW: 12.4 % (ref 11.0–15.0)
Total Lymphocyte: 25.9 %
WBC: 5.3 10*3/uL (ref 3.8–10.8)

## 2020-12-09 LAB — COMPLETE METABOLIC PANEL WITH GFR
AG Ratio: 1.8 (calc) (ref 1.0–2.5)
ALT: 10 U/L (ref 6–29)
AST: 11 U/L (ref 10–30)
Albumin: 4.2 g/dL (ref 3.6–5.1)
Alkaline phosphatase (APISO): 53 U/L (ref 31–125)
BUN: 16 mg/dL (ref 7–25)
CO2: 28 mmol/L (ref 20–32)
Calcium: 9.1 mg/dL (ref 8.6–10.2)
Chloride: 103 mmol/L (ref 98–110)
Creat: 0.68 mg/dL (ref 0.50–1.10)
GFR, Est African American: 124 mL/min/{1.73_m2} (ref >=60)
GFR, Est Non African American: 107 mL/min/{1.73_m2} (ref >=60)
Globulin: 2.4 g/dL (calc) (ref 1.9–3.7)
Glucose, Bld: 84 mg/dL (ref 65–99)
Potassium: 4.4 mmol/L (ref 3.5–5.3)
Sodium: 138 mmol/L (ref 135–146)
Total Bilirubin: 0.6 mg/dL (ref 0.2–1.2)
Total Protein: 6.6 g/dL (ref 6.1–8.1)

## 2020-12-10 LAB — TSH: TSH: 0.44 mIU/L

## 2020-12-10 LAB — T4, FREE: Free T4: 2.1 ng/dL — ABNORMAL HIGH (ref 0.8–1.8)

## 2020-12-10 LAB — T3, FREE: T3, Free: 3.5 pg/mL (ref 2.3–4.2)

## 2020-12-28 ENCOUNTER — Encounter (INDEPENDENT_AMBULATORY_CARE_PROVIDER_SITE_OTHER): Payer: Self-pay | Admitting: *Deleted

## 2021-01-05 ENCOUNTER — Other Ambulatory Visit (HOSPITAL_COMMUNITY): Payer: Self-pay

## 2021-01-06 ENCOUNTER — Other Ambulatory Visit (HOSPITAL_COMMUNITY): Payer: Self-pay

## 2021-05-02 ENCOUNTER — Other Ambulatory Visit (HOSPITAL_COMMUNITY): Payer: Self-pay

## 2021-06-27 ENCOUNTER — Telehealth (INDEPENDENT_AMBULATORY_CARE_PROVIDER_SITE_OTHER): Payer: Self-pay | Admitting: "Endocrinology

## 2021-06-27 DIAGNOSIS — E890 Postprocedural hypothyroidism: Secondary | ICD-10-CM

## 2021-06-27 DIAGNOSIS — H8112 Benign paroxysmal vertigo, left ear: Secondary | ICD-10-CM

## 2021-06-27 NOTE — Telephone Encounter (Signed)
[facility] called. She has had a lot of headaches, been more tired, been cold and had more vertigo. She has vertigo for 2-6 hours, once or twice per day. The headaches occur after the vertigo begins. The headaches are like a band around her head. She develops nausea soon after there vertigo begins, then the headaches. These symptoms began on 06/07/21. Her son had scarlet fever at that time. She takes meclizine, 24 mg, as she needs to for symptoms, not even every day. She treats the dizziness by laying down. She takes Zofran for the nausea. She has ringing in her left ear and can't hear as well in that ear. She does not feel any pressure in that ear.  She has been taking Synthroid, 125 mcg/day for 4 days and 250 mcg/day for three days.  She has an IUD, so does not have regular menses.  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  She may also be hypothyroid.  6. Plan: A. Take meclizine 50 mg, three times daily for three days. B. Repeat TFTs, CMP, CBC  C. Call on Thursday evening Tillman Sers, MD

## 2021-06-28 DIAGNOSIS — E890 Postprocedural hypothyroidism: Secondary | ICD-10-CM | POA: Diagnosis not present

## 2021-06-28 DIAGNOSIS — H8112 Benign paroxysmal vertigo, left ear: Secondary | ICD-10-CM | POA: Diagnosis not present

## 2021-06-28 LAB — CBC WITH DIFFERENTIAL/PLATELET
Absolute Monocytes: 632 cells/uL (ref 200–950)
Basophils Absolute: 71 cells/uL (ref 0–200)
Basophils Relative: 0.9 %
Eosinophils Absolute: 71 cells/uL (ref 15–500)
Eosinophils Relative: 0.9 %
HCT: 42.4 % (ref 35.0–45.0)
Hemoglobin: 14.4 g/dL (ref 11.7–15.5)
Lymphs Abs: 1888 cells/uL (ref 850–3900)
MCH: 30.8 pg (ref 27.0–33.0)
MCHC: 34 g/dL (ref 32.0–36.0)
MCV: 90.8 fL (ref 80.0–100.0)
MPV: 11.8 fL (ref 7.5–12.5)
Monocytes Relative: 8 %
Neutro Abs: 5238 cells/uL (ref 1500–7800)
Neutrophils Relative %: 66.3 %
Platelets: 201 10*3/uL (ref 140–400)
RBC: 4.67 10*6/uL (ref 3.80–5.10)
RDW: 12.4 % (ref 11.0–15.0)
Total Lymphocyte: 23.9 %
WBC: 7.9 10*3/uL (ref 3.8–10.8)

## 2021-06-28 LAB — COMPREHENSIVE METABOLIC PANEL
AG Ratio: 1.6 (calc) (ref 1.0–2.5)
ALT: 11 U/L (ref 6–29)
AST: 15 U/L (ref 10–30)
Albumin: 4.6 g/dL (ref 3.6–5.1)
Alkaline phosphatase (APISO): 62 U/L (ref 31–125)
BUN: 11 mg/dL (ref 7–25)
CO2: 28 mmol/L (ref 20–32)
Calcium: 9.9 mg/dL (ref 8.6–10.2)
Chloride: 101 mmol/L (ref 98–110)
Creat: 0.87 mg/dL (ref 0.50–0.99)
Globulin: 2.8 g/dL (calc) (ref 1.9–3.7)
Glucose, Bld: 77 mg/dL (ref 65–139)
Potassium: 4.3 mmol/L (ref 3.5–5.3)
Sodium: 137 mmol/L (ref 135–146)
Total Bilirubin: 0.6 mg/dL (ref 0.2–1.2)
Total Protein: 7.4 g/dL (ref 6.1–8.1)

## 2021-06-28 LAB — T3, FREE: T3, Free: 3.7 pg/mL (ref 2.3–4.2)

## 2021-06-28 LAB — T4, FREE: Free T4: 1.8 ng/dL (ref 0.8–1.8)

## 2021-06-28 LAB — TSH: TSH: 0.35 mIU/L — ABNORMAL LOW

## 2021-07-08 ENCOUNTER — Telehealth: Payer: Self-pay

## 2021-07-08 NOTE — Telephone Encounter (Signed)
Spoke with patient. Gave results and medication change. She was already scheduled to see Dr Tobe Sos in April .

## 2021-07-08 NOTE — Telephone Encounter (Signed)
-----   Message from Sherrlyn Hock, MD sent at 07/08/2021 12:23 AM EST ----- Thyroid tests were more hyperthyroid. CMP was normal. CBC was normal. Please reduce the thyroid hormone dose to 125 mcg/day for 5 days each week and 250 mcg/day on two days each week.   Clinical staff: Please schedule Ms. Burda to see me in two months. Thanks. Dr. Tobe Sos

## 2021-08-22 ENCOUNTER — Other Ambulatory Visit (HOSPITAL_COMMUNITY): Payer: Self-pay

## 2021-09-13 DIAGNOSIS — E890 Postprocedural hypothyroidism: Secondary | ICD-10-CM | POA: Diagnosis not present

## 2021-09-13 DIAGNOSIS — C73 Malignant neoplasm of thyroid gland: Secondary | ICD-10-CM | POA: Diagnosis not present

## 2021-09-14 ENCOUNTER — Ambulatory Visit (INDEPENDENT_AMBULATORY_CARE_PROVIDER_SITE_OTHER): Payer: 59 | Admitting: "Endocrinology

## 2021-09-14 LAB — TSH: TSH: 1.25 mIU/L

## 2021-09-14 LAB — T3, FREE: T3, Free: 3.1 pg/mL (ref 2.3–4.2)

## 2021-09-14 LAB — THYROGLOBULIN LEVEL: Thyroglobulin: <0.1 ng/mL — ABNORMAL LOW

## 2021-09-14 LAB — T4, FREE: Free T4: 1.5 ng/dL (ref 0.8–1.8)

## 2021-09-14 LAB — THYROGLOBULIN ANTIBODY: Thyroglobulin Ab: <1 IU/mL (ref <=1)

## 2021-11-29 ENCOUNTER — Other Ambulatory Visit (INDEPENDENT_AMBULATORY_CARE_PROVIDER_SITE_OTHER): Payer: Self-pay | Admitting: "Endocrinology

## 2021-11-29 DIAGNOSIS — E890 Postprocedural hypothyroidism: Secondary | ICD-10-CM

## 2021-11-30 ENCOUNTER — Other Ambulatory Visit (HOSPITAL_COMMUNITY): Payer: Self-pay

## 2021-11-30 MED ORDER — SYNTHROID 125 MCG PO TABS
ORAL_TABLET | ORAL | 0 refills | Status: DC
  Filled 2021-11-30: qty 110, 77d supply, fill #0

## 2021-12-21 ENCOUNTER — Encounter: Payer: Self-pay | Admitting: Family Medicine

## 2021-12-21 ENCOUNTER — Ambulatory Visit (INDEPENDENT_AMBULATORY_CARE_PROVIDER_SITE_OTHER): Payer: 59 | Admitting: Family Medicine

## 2021-12-21 VITALS — BP 119/75 | HR 64 | Ht 67.13 in | Wt 200.0 lb

## 2021-12-21 DIAGNOSIS — Z0000 Encounter for general adult medical examination without abnormal findings: Secondary | ICD-10-CM | POA: Diagnosis not present

## 2021-12-21 DIAGNOSIS — Z13220 Encounter for screening for lipoid disorders: Secondary | ICD-10-CM

## 2021-12-21 DIAGNOSIS — E890 Postprocedural hypothyroidism: Secondary | ICD-10-CM

## 2021-12-21 NOTE — Progress Notes (Signed)
Betty Reyes - 46 y.o. female MRN [id number]  Date of birth: February 28, 1976  Subjective Chief Complaint  Patient presents with   Annual Exam    HPI Betty Reyes is a 46 year old female here today for annual exam.  She reports that she is doing well.  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  She continues meclizine as needed.  She does need time off from work from time to time due to this.   She does try to stay pretty active.  She feels that her diet is pretty good.   She is up to date on immunizations  She had pap completed at GYN previously.  Unsure if she is due at this point.   Continues to see endocrinology for her thryoid.   Review of Systems  Constitutional:  Negative for chills, fever, malaise/fatigue and weight loss.  HENT:  Negative for congestion, ear pain and sore throat.   Eyes:  Negative for blurred vision, double vision and pain.  Respiratory:  Negative for cough and shortness of breath.   Cardiovascular:  Negative for chest pain and palpitations.  Gastrointestinal:  Negative for abdominal pain, blood in stool, constipation, heartburn and nausea.  Genitourinary:  Negative for dysuria and urgency.  Musculoskeletal:  Negative for joint pain and myalgias.  Neurological:  Negative for dizziness and headaches.  Endo/Heme/Allergies:  Does not bruise/bleed easily.  Psychiatric/Behavioral:  Negative for depression. The patient is not nervous/anxious and does not have insomnia.     No Known Allergies  Past Medical History:  Diagnosis Date   Bradycardia    Cancer (HCC)    Cervical polyp    Fatigue    History of radioactive iodine thyroid ablation    Hypothyroidism, postop    Obesity    Postpartum care following vaginal delivery (9/6) 02/16/2015   Thyroid cancer ([place])    Thyroid disease    HYPOTHYROIDISM    Past Surgical History:  Procedure Laterality Date   DILATION AND CURETTAGE OF UTERUS N/A 02/25/2017   Procedure: DILATATION  AND CURETTAGE;  Surgeon: Charyl Bigger, MD;  Location: [facility];  Service: Gynecology;  Laterality: N/A;   TOTAL THYROIDECTOMY      Social History   Socioeconomic History   Marital status: Married    Spouse name: Not on file   Number of children: Not on file   Years of education: Not on file   Highest education level: Not on file  Occupational History   Not on file  Tobacco Use   Smoking status: Never   Smokeless tobacco: Never  Vaping Use   Vaping Use: Never used  Substance and Sexual Activity   Alcohol use: No   Drug use: No   Sexual activity: Yes    Partners: Male  Other Topics Concern   Not on file  Social History Narrative   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   Social Determinants of Health   Financial Resource Strain: Not on file  Food Insecurity: Not on file  Transportation Needs: Not on file  Physical Activity: Not on file  Stress: Not on file  Social Connections: Not on file    Family History  Problem Relation Age of Onset   Cancer Mother    Hypertension Father    Hyperlipidemia Father    Heart disease Father        HEART ATTACK   Heart attack Father  Cancer Brother    Diabetes Maternal Grandfather    Hypothyroidism Paternal Grandmother    Heart attack Paternal Grandmother    Hypertension Paternal Grandmother    Heart attack Paternal Uncle    Hypertension Paternal Uncle    Heart attack Paternal Grandfather    Hypertension Paternal Grandfather     Health Maintenance  Topic Date Due   COVID-19 Vaccine (1) Never done   Hepatitis C Screening  Never done   PAP SMEAR-Modifier  06/13/2019   INFLUENZA VACCINE  01/10/2022   TETANUS/TDAP  06/12/2026   HIV Screening  Completed   HPV VACCINES  Aged Out      ----------------------------------------------------------------------------------------------------------------------------------------------------------------------------------------------------------------- Physical Exam BP 119/75 (BP Location: Left Arm, Patient Position: Sitting, Cuff Size: Large)   Pulse 64   Ht 5' 7.13" (1.705 m)   Wt 200 lb (90.7 kg)   SpO2 99%   BMI 31.20 kg/m   Physical Exam Constitutional:      General: She is not in acute distress. HENT:     Head: Normocephalic and atraumatic.     Right Ear: Tympanic membrane and ear canal normal.     Left Ear: Tympanic membrane and ear canal normal.     Nose: Nose normal.  Eyes:     General: No scleral icterus.    Conjunctiva/sclera: Conjunctivae normal.  Neck:     Thyroid: No thyromegaly.  Cardiovascular:     Rate and Rhythm: Normal rate and regular rhythm.     Heart sounds: Normal heart sounds.  Pulmonary:     Effort: Pulmonary effort is normal.     Breath sounds: Normal breath sounds.  Abdominal:     General: Bowel sounds are normal. There is no distension.     Palpations: Abdomen is soft.     Tenderness: There is no abdominal tenderness. There is no guarding.  Musculoskeletal:        General: Normal range of motion.     Cervical back: Normal range of motion and neck supple.  Lymphadenopathy:     Cervical: No cervical adenopathy.  Skin:    General: Skin is warm and dry.     Findings: No rash.  Neurological:     General: No focal deficit present.     Mental Status: She is alert and oriented to person, place, and time.     Cranial Nerves: No cranial nerve deficit.     Coordination: Coordination normal.  Psychiatric:        Mood and Affect: Mood normal.        Behavior: Behavior normal.      ------------------------------------------------------------------------------------------------------------------------------------------------------------------------------------------------------------------- Assessment and Plan  Hypothyroidism, postop She is followed by endocrinology.  Stable at this time.    Well adult exam Well adult Orders Placed This Encounter  Procedures   COMPLETE METABOLIC PANEL WITH GFR   CBC with Differential   Lipid Panel w/reflex Direct LDL  Screenings: Per lab orders.  Records requested for previous PAP Immunizations:  UTD Anticipatory guidance/Risk factor reduction:  Recommendations per AVS.     No orders of the defined types were placed in this encounter.   No follow-ups on file.    This visit occurred during the SARS-CoV-2 public health emergency.  Safety protocols were in place, including screening questions prior to the visit, additional usage of staff PPE, and extensive cleaning of exam room while observing appropriate contact time as indicated for disinfecting solutions.

## 2021-12-21 NOTE — Assessment & Plan Note (Signed)
She is followed by endocrinology.  Stable at this time.

## 2021-12-21 NOTE — Patient Instructions (Signed)
Preventive Care 40-44 Years Old, Female Preventive care refers to lifestyle choices and visits with your health care provider that can promote health and wellness. Preventive care visits are also called wellness exams. What can I expect for my preventive care visit? Counseling Your health care provider may ask you questions about your: Medical history, including: Past medical problems. Family medical history. Pregnancy history. Current health, including: Menstrual cycle. Method of birth control. Emotional well-being. Home life and relationship well-being. Sexual activity and sexual health. Lifestyle, including: Alcohol, nicotine or tobacco, and drug use. Access to firearms. Diet, exercise, and sleep habits. Work and work environment. Sunscreen use. Safety issues such as seatbelt and bike helmet use. Physical exam Your health care provider will check your: Height and weight. These may be used to calculate your BMI (body mass index). BMI is a measurement that tells if you are at a healthy weight. Waist circumference. This measures the distance around your waistline. This measurement also tells if you are at a healthy weight and may help predict your risk of certain diseases, such as type 2 diabetes and high blood pressure. Heart rate and blood pressure. Body temperature. Skin for abnormal spots. What immunizations do I need?  Vaccines are usually given at various ages, according to a schedule. Your health care provider will recommend vaccines for you based on your age, medical history, and lifestyle or other factors, such as travel or where you work. What tests do I need? Screening Your health care provider may recommend screening tests for certain conditions. This may include: Lipid and cholesterol levels. Diabetes screening. This is done by checking your blood sugar (glucose) after you have not eaten for a while (fasting). Pelvic exam and Pap test. Hepatitis B test. Hepatitis C  test. HIV (human immunodeficiency virus) test. STI (sexually transmitted infection) testing, if you are at risk. Lung cancer screening. Colorectal cancer screening. Mammogram. Talk with your health care provider about when you should start having regular mammograms. This may depend on whether you have a family history of breast cancer. BRCA-related cancer screening. This may be done if you have a family history of breast, ovarian, tubal, or peritoneal cancers. Bone density scan. This is done to screen for osteoporosis. Talk with your health care provider about your test results, treatment options, and if necessary, the need for more tests. Follow these instructions at home: Eating and drinking  Eat a diet that includes fresh fruits and vegetables, whole grains, lean protein, and low-fat dairy products. Take vitamin and mineral supplements as recommended by your health care provider. Do not drink alcohol if: Your health care provider tells you not to drink. You are pregnant, may be pregnant, or are planning to become pregnant. If you drink alcohol: Limit how much you have to 0-1 drink a day. Know how much alcohol is in your drink. In the [place] one drink equals one 12 oz bottle of beer (355 mL), one 5 oz glass of wine (148 mL), or one 1 oz glass of hard liquor (44 mL). Lifestyle Brush your teeth every morning and night with fluoride toothpaste. Floss one time each day. Exercise for at least 30 minutes 5 or more days each week. Do not use any products that contain nicotine or tobacco. These products include cigarettes, chewing tobacco, and vaping devices, such as e-cigarettes. If you need help quitting, ask your health care provider. Do not use drugs. If you are sexually active, practice safe sex. Use a condom or other form of protection to   prevent STIs. If you do not wish to become pregnant, use a form of birth control. If you plan to become pregnant, see your health care provider for a  prepregnancy visit. Take aspirin only as told by your health care provider. Make sure that you understand how much to take and what form to take. Work with your health care provider to find out whether it is safe and beneficial for you to take aspirin daily. Find healthy ways to manage stress, such as: Meditation, yoga, or listening to music. Journaling. Talking to a trusted person. Spending time with friends and family. Minimize exposure to UV radiation to reduce your risk of skin cancer. Safety Always wear your seat belt while driving or riding in a vehicle. Do not drive: If you have been drinking alcohol. Do not ride with someone who has been drinking. When you are tired or distracted. While texting. If you have been using any mind-altering substances or drugs. Wear a helmet and other protective equipment during sports activities. If you have firearms in your house, make sure you follow all gun safety procedures. Seek help if you have been physically or sexually abused. What's next? Visit your health care provider once a year for an annual wellness visit. Ask your health care provider how often you should have your eyes and teeth checked. Stay up to date on all vaccines. This information is not intended to replace advice given to you by your health care provider. Make sure you discuss any questions you have with your health care provider. Document Revised: 11/24/2020 Document Reviewed: 11/24/2020 Elsevier Patient Education  [place].

## 2021-12-21 NOTE — Assessment & Plan Note (Signed)
Well adult Orders Placed This Encounter  Procedures  . COMPLETE METABOLIC PANEL WITH GFR  . CBC with Differential  . Lipid Panel w/reflex Direct LDL  Screenings: Per lab orders.  Records requested for previous PAP Immunizations:  UTD Anticipatory guidance/Risk factor reduction:  Recommendations per AVS.

## 2021-12-23 ENCOUNTER — Other Ambulatory Visit (HOSPITAL_COMMUNITY): Payer: Self-pay

## 2021-12-23 MED ORDER — POLYMYXIN B-TRIMETHOPRIM 10000-0.1 UNIT/ML-% OP SOLN
OPHTHALMIC | 0 refills | Status: DC
  Filled 2021-12-23: qty 10, 7d supply, fill #0

## 2021-12-28 ENCOUNTER — Encounter: Payer: Self-pay | Admitting: Family Medicine

## 2022-01-04 ENCOUNTER — Encounter (INDEPENDENT_AMBULATORY_CARE_PROVIDER_SITE_OTHER): Payer: Self-pay | Admitting: "Endocrinology

## 2022-01-04 ENCOUNTER — Ambulatory Visit (INDEPENDENT_AMBULATORY_CARE_PROVIDER_SITE_OTHER): Payer: 59 | Admitting: "Endocrinology

## 2022-01-04 VITALS — BP 116/80 | HR 71 | Wt 197.4 lb

## 2022-01-04 DIAGNOSIS — C73 Malignant neoplasm of thyroid gland: Secondary | ICD-10-CM | POA: Diagnosis not present

## 2022-01-04 DIAGNOSIS — E661 Drug-induced obesity: Secondary | ICD-10-CM

## 2022-01-04 DIAGNOSIS — I10 Essential (primary) hypertension: Secondary | ICD-10-CM

## 2022-01-04 DIAGNOSIS — E890 Postprocedural hypothyroidism: Secondary | ICD-10-CM

## 2022-01-04 DIAGNOSIS — R5383 Other fatigue: Secondary | ICD-10-CM

## 2022-01-04 NOTE — Progress Notes (Signed)
CC: FU papillary thyroid cancer, post-operative hypothyroidism, iatrogenic hyperthyroidism, fatigue, bradycardia, overweight/obesity, recent pregnancy.  A. HPI: [name] is a 46 y.o. Caucasian woman who is accompanied by her husband today.  1. [name] was diagnosed with hypothyroidism, presumably secondary to Hashimoto's Disease, at some time prior to 2010. She was treated with Synthroid, 50 mcg per day. In the Summer of 2010 a goiter was noted.  A thyroid US showed multiple nodules bilaterally, with the largest being 2.6 cm in diameter. [name] was referred to Dr. Stark Klein, a local general surgeon, for further evaluation and management. Dr. Barry Dienes performed a fine needle aspiration, which reportedly showed Hashimoto's Disease. A follow-up thyroid US in the Summer of 2011 showed that the previously aspirated nodule and two other nodules had enlarged during the year. Dr. Barry Dienes performed a total thyroidectomy on 05/19/10. 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 The largest microfocus was in the superior right lobe, measured 4 mm in diameter, invaded the capsule, but did not extend extrathyroidally.    2. I saw the patient and her husband in consultation on 121/16/11. Clinically she showed no evidence of active thyroid cancer. I reviewed her surgical notes and pathology reports with the patient and her husband. I also discussed with them the latest clinical guidelines on the management of thyroid cancer from the American Thyroid Association. We discussed the options for therapy, including whether or not to give radioactive iodine therapy (RAI). Although microfocal cancers usually have a low risk of recurrence or death, her case had two factors that would potentially increase her risks. First, the fact that she had multiple foci in both lobes. Second, the fact that one microfocus did invade the thyroid capsule and  might have spread extrathyroidally, although the pathologist did not see evidence of extrathyroidal extension on the slides he reviewed.  I asked the patient to take oral contraceptives from that point until she either decided not to take RAI or until at least one year after receiving RAI. She agreed.  3.  On 06/14/10 I met with [name] and her husband again and we had another full and open discussion about the advantages and disadvantages of using RAI. Since the Hennises did not yet have children, and since neither of them wanted to leave a cancer untreated when a potentially curative treatment such as RAI was available, they wanted to proceed with RAI as soon as possible. Since Ms. Busler had not been on thyroid hormone replacement since surgery, she chose to receive RAI while undergoing thyroid hormone withdrawal and while following a strict low iodine diet. On 06/27/10, [name] received 50 millicuries of F-751. Her post-RAI scan showed uptake in the thyroid bed, but no spread outside the thyroid bed. As I explained to the Hennises, the I-131 uptake could have been due to the presence of some residual thyroid cells, or to some thyroid cancer cells, or both.   4. Clinical Course: The patient has done well clinically since her I-131 therapy. She has had no evidence of recurrence on serial physical exams. Her thyroglobulin (Tg) levels have progressively decreased. On 06/01/10 the post-operative Tg level was 20.8. On 06/14/10 the pre-I-131 Tg level was 2.9. She was hypothyroid for both of these Tg values. On 09/08/10 the post-I-131 Tg level was <0.2. At that point [name] was taking Synthroid, 175 mcg/day. Her unstimulated thyroglobulin levels have remained < 0.2 ever since.  5. [name]' last visit with me was on 09/15/20. I continued her Synthroid dose of 125 mcg/day for 4 days per week and 250 mcg/day for 3 days each week.  I later reduced her dosage to 250 mcg for two days each week and 125/day  for 5 days each week.   A. In the interim she has been healthy, but had more vertigo in January and February 2023. She is doing better by taking meclizine, 75 mg in the mornings. She no longer feels fatigued, colder, and less energetic.   B. She has been taking the Synthroid as prescribed. She takes the Synthroid by itself. She has been taking the Synthroid very reliably.   C. Her allergies have not bee been acting up much.   6. Pertinent Review of Systems: Constitutional. The patient feels "good overall". She has no other significant complaints that pertain to today's visit.  Sleep: She has not had much nocturia recently.  Body temperature: The patient's body temperature is normal.   Weight: Weight has increased by 10 pounds.   Eyes: The patient's vision is good. She is not aware of any eye problems. Neck: The patient is not aware of any lumps or masses in her anterior neck and thyroid bed.  There have been no significant other problems with  pressure, discomfort, or difficulty swallowing. Heart: The patient feels the expected increase in heart rate during exercise or other physical activities. There have been no significant problems with palpitations, irregular heart beats, chest pain, or chest pressure. Gastrointestinal: There are no significant complaints of excessive hunger, upset stomach, stomach aches or pains, diarrhea, or constipation. Musculoskeletal: Muscles and extremities appear to be working normally. There are no significant problems with hand tremor, sweaty palms, palmar erythema, or lower leg swelling. Psychological: Mood is "good overall". Mental: The patient's abilities to think, to pay attention, to remember, and to make decisions are "good".  GYN: Her LMP was prior to her IUD insertion about November 2018.   PAST MEDICAL, FAMILY, AND SOCIAL HISTORY: 1. Work and family: She works full-time as a Writer at [employer]. 2. Activities: She is walking at times with her husband.     3. Primary care provider: Dr. Luetta Nutting at [facility] (?) 4. OB-GYN: Dr. Aloha Gell, [facility], [phone]  3. REVIEW OF SYSTEMS: [name] has no significant problems related to any of her other body systems  PHYSICAL EXAM:  BP 116/80   Pulse 71   Wt 197 lb 6.4 oz (89.5 kg)   LMP 04/02/2017   BMI 30.80 kg/m    Constitutional: She looks healthy today. She has gained 10 pounds.  Her affect and insight are normal.  Eyes: There is no arcus or proptosis.  Mouth: The oropharynx appears normal. The tongue appears normal. There is normal oral moisture. There is no obvious gingivitis. She has her usual mild grade I blond vellus mustache today.  Neck: The neck appears to be visibly normal. No carotid bruits are noted. The thyroid gland is absent. I do not palpate any excess tissue in her thyroid bed, posterior cervical area, lateral neck, or supraclavicular areas.   Lungs: The lungs are clear to auscultation. Air movement is good. Heart: Heart rate and rhythm are regular.Heart sounds S1 and S2 are normal. I did not appreciate any pathologic cardiac murmurs. Abdomen: The abdomen is enlarged. Bowel sounds are normal. There is no obvious hepatomegaly, splenomegaly, or other mass effect.  Arms: Muscle size and bulk  are normal for age. Hands: There is no obvious tremor. Phalangeal and metacarpophalangeal joints are normal. Palmar muscles are normal for age. Palmar skin is normal. Palmar moisture is also normal. Legs: Muscles appear normal for age. No edema is present. Neurologic: Strength is normal for age in both the upper and lower extremities. Muscle tone is normal. Sensation to touch is normal in both legs.          No data to display           LABS:  Labs 09/13/21: TSH 1.25, free T4 1.5, free T3 3.1; thyroglobulin antibody <1, thyroglobulin <0.1   Labs 06/28/21: TSH 0.35, free T4 1.2, free T3 3.7; CMP ; CBC  Labs 09/15/20: HbA1c 5.2%  Labs  09/08/20: TSH 16.79, free T4 1.2, free T3 2.4, thyroglobulin <0.1, thyroglobulin antibody <1   Labs 01/06/20: TSH 0.65 , free T4 2.0, free T3 4.0; DHEAS 135 (ref 19-231)  Labs 08/12/19: TSH 8.87, free T4 1.7. free T3 3.2, thyroglobulin <0.1, thyroglobulin antibody <1  Labs 07/31/18: TSH 0.93, free T4 1.7, free  T3 3.7; thyroglobulin <0.1, thyroglobulin antibody <1  Labs 02/04/18: BMP normal; CBC normal except WBC 14.9 during an illness in which she passed out with orthostasis due to dehydration.   Labs 04/04/17: TSH 0.62, free T4 1.3, free T3 2.7  Labs 01/07/18: TSH 0.81, free T4 1.6, free T3 3.0  Labs 09/26/17: TSH 5.02, free T4 1.2, free T3 2.6, thyroglobulin <0.1, thyroglobulin antibody <1  Labs 08/06/17: TSH 3.86, free T4 1.4, free T3 3.0  Labs 04/04/17: TSH 0.62, free T4 1.3, free T3 2.7  Labs 01/09/17: TSH 0.22, free T4 1.4, free T3 3.0, thyroglobulin <0.1, anti-thyroglobulin antibody <1  Labs 10/30/16: TSH 0.70, T4 7.6 (ref 4.5-12), T3 142 (ref 71-180); TSI <0.1 IU/L  Labs 06/27/16: TSH 5.43, free T4 1.3, free T3 3.2  Labs: 03/15/16: TSH 7.68, free T4 1.6, free T3 3.1  Labs 01/25/16: TSH 8.39, free T4 1.2, free T3 3.0  Labs 10/26/15: TSH 0.96, free T4 1.2, free T3 2.8, thyroglobulin <0.1, anti-thyroglobulin antibody <1   Labs 07/10/15: TSH 0.190, free T4 2.4, free T3 3.4  Labs 05/03/15: TSH 0.162, free T4 1.95, free T3 3.2, thyroglobulin (Tg) < 0.1, anti-Tg antibody < 1  Labs 01/22/15: TSH 0.529, free T4 1.29, free T3 2.8  Labs 12/16/14: TSH 0.811, free T4 1.00, free T3 2.4  Labs 11/05/14: TSH 0.207, free T4 1.17, free T3 2.7  Labs 08/08/13: TSH 2.325, free T4 1.08, free T3 2.9  Labs 05/30/14: Thyroglobulin < 0.1, anti-thyroglobulin antibody <1, TPO antibody 37, TSH 0.209, free T4 1.29, free T3 3.5  Labs 11/19/13: TSH 0.217, free T4 1.73, free T3 3.4, thyroglobulin <0.2, thyroglobulin antibody < 20  Labs 09/18/13: TSH 0.137, free T4 1.70, free T3 3.4  Labs 05/30/13: TSH 1.422,  free T4 1.51, free T3 3.1, thyroglobulin <0.2, thyroglobulin antibody <20  Labs 02/19/13: TSH 1.734, free T4 1.35, free T3 3.2, TPO antibody 40.5, Tg antibody < 20, thyroglobulin < 0.2  Labs 10/01/12: TSH 0.312, free T4 1.66, free T3 3.0, TPO antibody 44.2, thyroglobulin < 0.2, anti-thyroglobulin antibody < 20.  Labs 10/23/11: TSH 0.214, free T4 1.54, free T3 3.2, thyroglobulin < 0.2, anti-thyroglobulin antibody < 20  Labs 08/21/11: TSH 17.234, free T4 1.73, free T3 3.3, Thyrogen-stimulated thyroglobulin (Tg) < 0.2 (undetectable). Anti-thyroglobulin antibody < 20 (normal).  Labs 04/20/11: Thyroglobulin (Tg) < 0.2 (undetectable), anti-thyroglobulin antibody (Tg Ab) < 20   Labs 04/04/11: TSH 0.154,  free T4 1.57, free T3 3.1  Labs 08/19/10: Thyroglobulin <0.2. Thyroglobulin antibody <20.  ASSESSMENT: 1. Papillary thyroid cancer:   A. Her exam at her visit in April 2022 was normal. There was still no clinical or lab evidence of recurrence.   B. As noted above, her Thyrogen-stimulated thyroglobulin (Tg) levels and unstimulated Tg levels have been unmeasurable since March 2012. She has not had any chemical evidence of recurrence. Her Tg panel on 08/12/19 and on 09/08/20 showed no signs of recurrence. Her Tg and Tg antibody in Aril 2023 were again unmeasurable.   C. Today in July 2023 her clinical exam is normal. She still does not show any signs of clinical recurrence.   D. At this point, 11 years after her thyroidectomy and I-131 therapy, it appears that [name]' PTC is in remission and is probably cured. She will need continuing follow-up of her PTC annually, but may need adjustment of her Synthroid dose every 6-12 months.   D. Although [name] still needs Synthroid therapy, we can allow her TSH to increase to the 0.50-2.0 range. Her goal range for TSH is 1.0-2.0. 2. Hypothyroid/hyperthyroid:   A. Her TSH had been suppressed iatrogenically since 2012, but was elevated from August 2017 to January  2018.  Her TFTs have fluctuated several times and we have adjusted her Synthroid doses accordingly.   B. She was hypothyroid in March 2022 .We increased her Synthroid dosage. Her TFTs in April 2023 were good. Her TSH of 1.25 was in the goal range of 1.0-2.0. 3. Fatigue: Her fatigue has improved.   4. Obesity: She has gained 10 pounds since her last visits.   5. Hirsutism: Her fat cell weight has caused some increase in insulin resistance and a compensatory increase in insulin production. The hyperinsulinemia, in turn, has caused more production of testosterone from her ovaries and androstenedione from her adrenal glands. Reducing her fat cell volume should result in reducing her androgen levels.     6. Hypertension: Her DBP is elevated today. I offered her the choice of starting anti-hypertensive therapy or walking more. She chose to walk more.   PLAN: 1. Diagnostic: I reviewed her recent TFTs and Thyroglobulin panel today. She will need repeat TFTs and Tg panel in the Spring of 2024. 2. Therapeutic: Continue the Synthroid dosage of 125 mcg daily for 5 days each week and 250 mcg/day for 2 days each week. Adjust the Synthroid doses in the future as needed.  3. Patient education: We discussed the issue of maintaining her TSH in the range of 0.5-2.5 as a preventive measure.  4. Follow-up: Follow-up appointment in 9 months with her PCP, Dr. Zigmund Daniel. Dr. Zigmund Daniel can then assess whether or not she will need a referral to [facility].  Level of Service: This visit lasted in excess of 50 minutes. More than 50% of the visit was devoted to counseling.  Sherrlyn Hock, MD, [facility]

## 2022-01-12 ENCOUNTER — Encounter (INDEPENDENT_AMBULATORY_CARE_PROVIDER_SITE_OTHER): Payer: Self-pay

## 2022-01-26 ENCOUNTER — Telehealth: Payer: Self-pay | Admitting: Family Medicine

## 2022-01-26 NOTE — Telephone Encounter (Signed)
Patient brought documents in requesting FMLA paperwork to be completed by PCP. Patient was informed of potential fees and a 3-5 day turn around. Paperwork was placed in provider's basket. Patient also stated endocrinologist is retiring and looking to replace for her and her spouse. Please reach out to patient per her request. Betty Reyes

## 2022-02-09 ENCOUNTER — Other Ambulatory Visit (INDEPENDENT_AMBULATORY_CARE_PROVIDER_SITE_OTHER): Payer: Self-pay | Admitting: "Endocrinology

## 2022-02-09 ENCOUNTER — Other Ambulatory Visit (HOSPITAL_COMMUNITY): Payer: Self-pay

## 2022-02-09 DIAGNOSIS — E890 Postprocedural hypothyroidism: Secondary | ICD-10-CM

## 2022-02-09 MED ORDER — SYNTHROID 125 MCG PO TABS
ORAL_TABLET | ORAL | 1 refills | Status: DC
  Filled 2022-02-09 (×2): qty 108, 84d supply, fill #0
  Filled 2022-05-21: qty 108, 84d supply, fill #1

## 2022-03-02 DIAGNOSIS — Z0000 Encounter for general adult medical examination without abnormal findings: Secondary | ICD-10-CM | POA: Diagnosis not present

## 2022-03-02 DIAGNOSIS — Z13220 Encounter for screening for lipoid disorders: Secondary | ICD-10-CM | POA: Diagnosis not present

## 2022-03-03 LAB — COMPLETE METABOLIC PANEL WITH GFR
AG Ratio: 1.7 (calc) (ref 1.0–2.5)
ALT: 13 U/L (ref 6–29)
AST: 14 U/L (ref 10–30)
Albumin: 4.5 g/dL (ref 3.6–5.1)
Alkaline phosphatase (APISO): 57 U/L (ref 31–125)
BUN: 11 mg/dL (ref 7–25)
CO2: 25 mmol/L (ref 20–32)
Calcium: 9.4 mg/dL (ref 8.6–10.2)
Chloride: 105 mmol/L (ref 98–110)
Creat: 0.78 mg/dL (ref 0.50–0.99)
Globulin: 2.6 g/dL (calc) (ref 1.9–3.7)
Glucose, Bld: 78 mg/dL (ref 65–99)
Potassium: 4.3 mmol/L (ref 3.5–5.3)
Sodium: 138 mmol/L (ref 135–146)
Total Bilirubin: 0.8 mg/dL (ref 0.2–1.2)
Total Protein: 7.1 g/dL (ref 6.1–8.1)
eGFR: 96 mL/min/{1.73_m2} (ref >=60)

## 2022-03-03 LAB — CBC WITH DIFFERENTIAL/PLATELET
Absolute Monocytes: 599 cells/uL (ref 200–950)
Basophils Absolute: 51 cells/uL (ref 0–200)
Basophils Relative: 0.7 %
Eosinophils Absolute: 88 cells/uL (ref 15–500)
Eosinophils Relative: 1.2 %
HCT: 40.7 % (ref 35.0–45.0)
Hemoglobin: 14 g/dL (ref 11.7–15.5)
Lymphs Abs: 1862 cells/uL (ref 850–3900)
MCH: 31 pg (ref 27.0–33.0)
MCHC: 34.4 g/dL (ref 32.0–36.0)
MCV: 90.2 fL (ref 80.0–100.0)
MPV: 11.7 fL (ref 7.5–12.5)
Monocytes Relative: 8.2 %
Neutro Abs: 4701 cells/uL (ref 1500–7800)
Neutrophils Relative %: 64.4 %
Platelets: 183 10*3/uL (ref 140–400)
RBC: 4.51 10*6/uL (ref 3.80–5.10)
RDW: 12.6 % (ref 11.0–15.0)
Total Lymphocyte: 25.5 %
WBC: 7.3 10*3/uL (ref 3.8–10.8)

## 2022-03-03 LAB — LIPID PANEL W/REFLEX DIRECT LDL
Cholesterol: 178 mg/dL (ref <200)
HDL: 45 mg/dL — ABNORMAL LOW (ref >=50)
LDL Cholesterol (Calc): 116 mg/dL (calc) — ABNORMAL HIGH
Non-HDL Cholesterol (Calc): 133 mg/dL (calc) — ABNORMAL HIGH (ref <130)
Total CHOL/HDL Ratio: 4 (calc) (ref <5.0)
Triglycerides: 78 mg/dL (ref <150)

## 2022-03-06 ENCOUNTER — Encounter: Payer: Self-pay | Admitting: Family Medicine

## 2022-05-22 ENCOUNTER — Other Ambulatory Visit (HOSPITAL_COMMUNITY): Payer: Self-pay

## 2022-05-25 ENCOUNTER — Other Ambulatory Visit (HOSPITAL_COMMUNITY): Payer: Self-pay

## 2022-09-11 ENCOUNTER — Telehealth: Payer: Commercial Managed Care - PPO | Admitting: Family Medicine

## 2022-09-11 DIAGNOSIS — J029 Acute pharyngitis, unspecified: Secondary | ICD-10-CM | POA: Diagnosis not present

## 2022-09-11 MED ORDER — AMOXICILLIN 500 MG PO TABS: 500.0000 mg | ORAL_TABLET | Freq: Two times a day (BID) | ORAL | 0 refills | Status: DC

## 2022-09-11 MED ORDER — AMOXICILLIN 500 MG PO TABS
500.0000 mg | ORAL_TABLET | Freq: Two times a day (BID) | ORAL | 0 refills | Status: AC
Start: 2022-09-11 — End: 2022-09-21

## 2022-09-11 MED ORDER — LIDOCAINE VISCOUS HCL 2 % MT SOLN
15.0000 mL | OROMUCOSAL | 0 refills | Status: DC | PRN
Start: 2022-09-11 — End: 2022-12-27

## 2022-09-11 MED ORDER — LIDOCAINE VISCOUS HCL 2 % MT SOLN
15.0000 mL | OROMUCOSAL | 0 refills | Status: DC | PRN
Start: 2022-09-11 — End: 2022-09-11

## 2022-09-11 NOTE — Addendum Note (Signed)
Addended by: Mar Daring on: 09/11/2022 07:12 PM   Modules accepted: Orders

## 2022-09-11 NOTE — Progress Notes (Signed)
E-Visit for Sore Throat - Strep Symptoms  We are sorry that you are not feeling well.  Here is how we plan to help!  Based on what you have shared with me it is likely that you have strep pharyngitis.  Strep pharyngitis is inflammation and infection in the back of the throat.  This is an infection cause by bacteria and is treated with antibiotics.  I have prescribed Amoxicillin 500 mg twice a day for 10 days and 2% Viscous Lidocaine 5 ml gargle and swallow every 4 hours as needed for throat pain. For throat pain, we recommend over the counter oral pain relief medications such as acetaminophen or aspirin, or anti-inflammatory medications such as ibuprofen or naproxen sodium. Topical treatments such as oral throat lozenges or sprays may be used as needed. Strep infections are not as easily transmitted as other respiratory infections, however we still recommend that you avoid close contact with loved ones, especially the very young and elderly.  Remember to wash your hands thoroughly throughout the day as this is the number one way to prevent the spread of infection and wipe down door knobs and counters with disinfectant.   Home Care: Only take medications as instructed by your medical team. Complete the entire course of an antibiotic. Do not take these medications with alcohol. A steam or ultrasonic humidifier can help congestion.  You can place a towel over your head and breathe in the steam from hot water coming from a faucet. Avoid close contacts especially the very young and the elderly. Cover your mouth when you cough or sneeze. Always remember to wash your hands.  Get Help Right Away If: You develop worsening fever or sinus pain. You develop a severe head ache or visual changes. Your symptoms persist after you have completed your treatment plan.  Make sure you Understand these instructions. Will watch your condition. Will get help right away if you are not doing well or get  worse.   Thank you for choosing an e-visit.  Your e-visit answers were reviewed by a board certified advanced clinical practitioner to complete your personal care plan. Depending upon the condition, your plan could have included both over the counter or prescription medications.  Please review your pharmacy choice. Make sure the pharmacy is open so you can pick up prescription now. If there is a problem, you may contact your provider through MyChart messaging and have the prescription routed to another pharmacy.  Your safety is important to us. If you have drug allergies check your prescription carefully.   For the next 24 hours you can use MyChart to ask questions about today's visit, request a non-urgent call back, or ask for a work or school excuse. You will get an email in the next two days asking about your experience. I hope that your e-visit has been valuable and will speed your recovery.  I provided 5 minutes of non face-to-face time during this encounter for chart review, medication and order placement, as well as and documentation.   

## 2022-10-10 ENCOUNTER — Other Ambulatory Visit (INDEPENDENT_AMBULATORY_CARE_PROVIDER_SITE_OTHER): Payer: Self-pay

## 2022-10-13 ENCOUNTER — Other Ambulatory Visit (INDEPENDENT_AMBULATORY_CARE_PROVIDER_SITE_OTHER): Payer: Self-pay | Admitting: Family Medicine

## 2022-10-13 ENCOUNTER — Other Ambulatory Visit (HOSPITAL_BASED_OUTPATIENT_CLINIC_OR_DEPARTMENT_OTHER): Payer: Self-pay

## 2022-10-13 ENCOUNTER — Other Ambulatory Visit (HOSPITAL_COMMUNITY): Payer: Self-pay

## 2022-10-13 DIAGNOSIS — E890 Postprocedural hypothyroidism: Secondary | ICD-10-CM

## 2022-10-17 ENCOUNTER — Telehealth: Payer: Self-pay | Admitting: Family Medicine

## 2022-10-17 ENCOUNTER — Other Ambulatory Visit (HOSPITAL_BASED_OUTPATIENT_CLINIC_OR_DEPARTMENT_OTHER): Payer: Self-pay

## 2022-10-17 DIAGNOSIS — E890 Postprocedural hypothyroidism: Secondary | ICD-10-CM

## 2022-10-17 MED ORDER — SYNTHROID 125 MCG PO TABS
ORAL_TABLET | ORAL | 0 refills | Status: DC
Start: 2022-10-17 — End: 2022-11-01
  Filled 2022-10-17: qty 108, 90d supply, fill #0
  Filled 2022-10-19: qty 6, 6d supply, fill #0
  Filled 2022-10-24: qty 102, 78d supply, fill #1

## 2022-10-17 NOTE — Telephone Encounter (Signed)
Sent 3 month refill to [facility].

## 2022-10-17 NOTE — Telephone Encounter (Signed)
Patient called in regards to refills on Synthroid tab please submit to [facility] she is scheduling an appointment also ok to try generatic brand patient only has 3 pills

## 2022-10-19 ENCOUNTER — Telehealth: Payer: Self-pay | Admitting: Family Medicine

## 2022-10-19 ENCOUNTER — Other Ambulatory Visit: Payer: Self-pay

## 2022-10-19 ENCOUNTER — Encounter: Payer: Self-pay | Admitting: Family Medicine

## 2022-10-19 ENCOUNTER — Other Ambulatory Visit (HOSPITAL_BASED_OUTPATIENT_CLINIC_OR_DEPARTMENT_OTHER): Payer: Self-pay

## 2022-10-19 DIAGNOSIS — E890 Postprocedural hypothyroidism: Secondary | ICD-10-CM

## 2022-10-19 NOTE — Telephone Encounter (Signed)
Patient called office stating Synthroid 125 mcg requires PA to be completed through insurance.  Patient states she is completely out of medication and wants to know if Rx can be written for one month of the generic (Levothyroxine )while she is waiting on PA to be completed. Patient would like Rx sent to [facility] pharmacy. Please advise.  [phone]

## 2022-10-24 ENCOUNTER — Other Ambulatory Visit (HOSPITAL_COMMUNITY)
Admission: RE | Admit: 2022-10-24 | Discharge: 2022-10-24 | Disposition: A | Discharge status: Home / Self Care | Payer: Commercial Managed Care - PPO | Source: Ambulatory Visit | Attending: Medical-Surgical | Admitting: Medical-Surgical

## 2022-10-24 ENCOUNTER — Ambulatory Visit: Payer: Commercial Managed Care - PPO | Admitting: Medical-Surgical

## 2022-10-24 ENCOUNTER — Encounter: Payer: Self-pay | Admitting: Medical-Surgical

## 2022-10-24 ENCOUNTER — Telehealth: Payer: Self-pay

## 2022-10-24 ENCOUNTER — Other Ambulatory Visit (HOSPITAL_BASED_OUTPATIENT_CLINIC_OR_DEPARTMENT_OTHER): Payer: Self-pay

## 2022-10-24 VITALS — BP 123/79 | HR 70 | Temp 98.8°F | Ht 67.13 in | Wt 208.0 lb

## 2022-10-24 DIAGNOSIS — Z1231 Encounter for screening mammogram for malignant neoplasm of breast: Secondary | ICD-10-CM

## 2022-10-24 DIAGNOSIS — Z124 Encounter for screening for malignant neoplasm of cervix: Secondary | ICD-10-CM

## 2022-10-24 DIAGNOSIS — Z975 Presence of (intrauterine) contraceptive device: Secondary | ICD-10-CM

## 2022-10-24 NOTE — Telephone Encounter (Signed)
[facility] ([facility]) - [phone] Synthroid tablets Status: PA RequestCreated: May 14th, 2024 [id number] Sent: May 14th, 2024

## 2022-10-24 NOTE — Progress Notes (Signed)
        Established patient visit  History, exam, impression, and plan:  1. IUD (intrauterine device) in place Reports that she had a IUD replaced after her last child was born but is unsure which IUD that was placed and how long it is good for her.  Notes that if it is time to come out, then she is perfectly fine with that it would not want one replaced.  Plans to use condoms for birth control after it is removed.  On vaginal exam, IUD strings noted at cervical os indicating no displacement.  We do not have record of what IUD was placed as this was done at an outside OB/GYN office.  Requesting records in order to determine next steps.  2. Cervical cancer screening Last Pap smear approximately 5-6 years ago after the birth of her child.  No history of abnormal Pap smears.  No concerns for STIs, declines testing.  No abnormal vaginal discharge, itching, odor, or bleeding.  Has an IUD in place and not having regular menstrual cycles.  Patient placed in lithotomy position.  Speculum inserted into the vagina without difficulty.  Cervix visualized, slightly friable.  IUD strings visualized as above.  Pap smear sample collected.  Manual exam with no CMT.  No uterine or ovarian abnormalities palpated.  Abdomen soft, nontender.  Pap smear completed today with HPV cotesting.  If normal, will be due again in 5 years. - Cytology - PAP  3. Encounter for screening mammogram for malignant neoplasm of breast Due to a recent health issue with her husband, she is worried regarding potential breast cancer and the need for screening.  She does self breast exams and has not noticed any changes in her breast tissue.  Manual breast exam completed today with no lumps or areas of tenderness noted.  No nipple discharge or axillary lymphadenopathy.  Has never had a mammogram due to issues with insurance coverage so ordering today. - MM 3D SCREENING MAMMOGRAM BILATERAL BREAST; Future  Procedures performed this  visit: None.  Return if symptoms worsen or fail to improve.  Follow-up with PCP for annual physical in July or sooner if needed.  __________________________________ Thayer Ohm, DNP, APRN, FNP-BC Primary Care and Sports Medicine [facility] [place]

## 2022-10-24 NOTE — Telephone Encounter (Signed)
Patient was in office for visit today and stated that her pharmacy informed her of her SYNTHROID Prior Berkley Harvey has not been completed yet, please advise, thanks.

## 2022-10-25 ENCOUNTER — Encounter: Payer: Self-pay | Admitting: Medical-Surgical

## 2022-10-25 ENCOUNTER — Ambulatory Visit (INDEPENDENT_AMBULATORY_CARE_PROVIDER_SITE_OTHER): Payer: Commercial Managed Care - PPO

## 2022-10-25 DIAGNOSIS — Z1231 Encounter for screening mammogram for malignant neoplasm of breast: Secondary | ICD-10-CM | POA: Diagnosis not present

## 2022-10-25 LAB — CYTOLOGY - PAP
Comment: NEGATIVE
Diagnosis: NEGATIVE
High risk HPV: NEGATIVE

## 2022-10-26 ENCOUNTER — Other Ambulatory Visit (HOSPITAL_BASED_OUTPATIENT_CLINIC_OR_DEPARTMENT_OTHER): Payer: Self-pay

## 2022-10-26 NOTE — Telephone Encounter (Signed)
Received letter that synthroid has been approved.

## 2022-11-01 ENCOUNTER — Other Ambulatory Visit (HOSPITAL_BASED_OUTPATIENT_CLINIC_OR_DEPARTMENT_OTHER): Payer: Self-pay

## 2022-11-01 ENCOUNTER — Other Ambulatory Visit: Payer: Self-pay

## 2022-11-01 DIAGNOSIS — E890 Postprocedural hypothyroidism: Secondary | ICD-10-CM

## 2022-11-01 MED ORDER — SYNTHROID 125 MCG PO TABS
ORAL_TABLET | ORAL | 0 refills | Status: DC
Start: 2022-11-01 — End: 2023-01-12

## 2022-12-27 ENCOUNTER — Ambulatory Visit (INDEPENDENT_AMBULATORY_CARE_PROVIDER_SITE_OTHER): Payer: Commercial Managed Care - PPO | Admitting: Family Medicine

## 2022-12-27 ENCOUNTER — Encounter: Payer: Self-pay | Admitting: Family Medicine

## 2022-12-27 VITALS — BP 111/72 | HR 61 | Ht 67.13 in | Wt 209.0 lb

## 2022-12-27 DIAGNOSIS — Z13220 Encounter for screening for lipoid disorders: Secondary | ICD-10-CM

## 2022-12-27 DIAGNOSIS — Z0000 Encounter for general adult medical examination without abnormal findings: Secondary | ICD-10-CM | POA: Diagnosis not present

## 2022-12-27 DIAGNOSIS — C73 Malignant neoplasm of thyroid gland: Secondary | ICD-10-CM

## 2022-12-27 DIAGNOSIS — Z1211 Encounter for screening for malignant neoplasm of colon: Secondary | ICD-10-CM | POA: Diagnosis not present

## 2022-12-27 DIAGNOSIS — E890 Postprocedural hypothyroidism: Secondary | ICD-10-CM | POA: Diagnosis not present

## 2022-12-27 NOTE — Assessment & Plan Note (Addendum)
Well adult Orders Placed This Encounter  Procedures   COMPLETE METABOLIC PANEL WITH GFR   CBC with Differential   Lipid Panel w/reflex Direct LDL   TSH   Thyroglobulin Level   Thyroglobulin antibody   T4, free   T3, free   Ambulatory referral to Gastroenterology    Referral Priority:   Routine    Referral Type:   Consultation    Referral Reason:   Specialty Services Required    Number of Visits Requested:   1  Screenings:  Due for colon cancer screening-Referral entered to GI.  Additional screening per lab orders Immunizations:  UTD Anticipatory guidance/Risk factor reduction:  Recommendations per AVS.

## 2022-12-27 NOTE — Progress Notes (Signed)
Betty Reyes - 47 y.o. female MRN [id number]  Date of birth: 25-Feb-1976  Subjective Chief Complaint  Patient presents with   Annual Exam    HPI Betty Reyes is a 47 y.o. female here today for annual exam.   She reports that she doing pretty well  Unfortunately, her husband has been diagnosed with cancer since her last visit with me.  He is receiving chemotherapy.  This has been stressful. She is managing with good family and friend support as well as prayer.    Her activity level is decreased.  She feels that diet is OK, could be improved.  More processed/fast foods due to schedule.   She is a non-smoker.  Denies EtOH use.    Review of Systems  Constitutional:  Negative for chills, fever, malaise/fatigue and weight loss.  HENT:  Negative for congestion, ear pain and sore throat.   Eyes:  Negative for blurred vision, double vision and pain.  Respiratory:  Negative for cough and shortness of breath.   Cardiovascular:  Negative for chest pain and palpitations.  Gastrointestinal:  Negative for abdominal pain, blood in stool, constipation, heartburn and nausea.  Genitourinary:  Negative for dysuria and urgency.  Musculoskeletal:  Negative for joint pain and myalgias.  Neurological:  Negative for dizziness and headaches.  Endo/Heme/Allergies:  Does not bruise/bleed easily.  Psychiatric/Behavioral:  Negative for depression. The patient is not nervous/anxious and does not have insomnia.     No Known Allergies  Past Medical History:  Diagnosis Date   Bradycardia    Cancer (HCC)    Cervical polyp    Fatigue    History of radioactive iodine thyroid ablation    Hypothyroidism, postop    Obesity    Postpartum care following vaginal delivery (9/6) 02/16/2015   Thyroid cancer (HCC)    Thyroid disease    HYPOTHYROIDISM    Past Surgical History:  Procedure Laterality Date   DILATION AND CURETTAGE OF UTERUS N/A 02/25/2017   Procedure: DILATATION AND CURETTAGE;  Surgeon:  Vick Frees, MD;  Location: [facility];  Service: Gynecology;  Laterality: N/A;   INTRAUTERINE DEVICE (IUD) INSERTION  04/09/2017   TOTAL THYROIDECTOMY      Social History   Socioeconomic History   Marital status: Married    Spouse name: Not on file   Number of children: Not on file   Years of education: Not on file   Highest education level: Not on file  Occupational History   Not on file  Tobacco Use   Smoking status: Never   Smokeless tobacco: Never  Vaping Use   Vaping status: Never Used  Substance and Sexual Activity   Alcohol use: No   Drug use: No   Sexual activity: Yes    Partners: Male  Other Topics Concern   Not on file  Social History Narrative   Patient is a Designer, 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   Social Determinants of Health   Financial Resource Strain: Not on file  Food Insecurity: Not on file  Transportation Needs: Not on file  Physical Activity: Not on file  Stress: Not on file  Social Connections: Not on file    Family History  Problem Relation Age of Onset   Cancer Mother    Hypertension Father    Hyperlipidemia Father    Heart disease Father        HEART ATTACK   Heart attack Father  Cancer Brother    Diabetes Maternal Grandfather    Hypothyroidism Paternal Grandmother    Heart attack Paternal Grandmother    Hypertension Paternal Grandmother    Heart attack Paternal Uncle    Hypertension Paternal Uncle    Heart attack Paternal Grandfather    Hypertension Paternal Grandfather     Health Maintenance  Topic Date Due   COVID-19 Vaccine (3 - Pfizer risk series) 04/27/2023 (Originally 04/09/2020)   Colonoscopy  12/27/2023 (Originally 08/17/2022)   Hepatitis C Screening  12/27/2023 (Originally 08/17/1995)   INFLUENZA VACCINE  01/11/2023   MAMMOGRAM  10/25/2023   PAP SMEAR-Modifier  10/23/2025   DTaP/Tdap/Td (2 - Td or Tdap) 06/12/2026   HIV Screening  Completed   HPV VACCINES   Aged Out     ----------------------------------------------------------------------------------------------------------------------------------------------------------------------------------------------------------------- Physical Exam BP 111/72 (BP Location: Left Arm, Patient Position: Sitting, Cuff Size: Large)   Pulse 61   Ht 5' 7.13" (1.705 m)   Wt 209 lb (94.8 kg)   SpO2 97%   BMI 32.61 kg/m   Physical Exam Constitutional:      General: She is not in acute distress. HENT:     Head: Normocephalic and atraumatic.     Right Ear: Tympanic membrane and ear canal normal.     Left Ear: Tympanic membrane and ear canal normal.     Nose: Nose normal.  Eyes:     General: No scleral icterus.    Conjunctiva/sclera: Conjunctivae normal.  Neck:     Thyroid: No thyromegaly.  Cardiovascular:     Rate and Rhythm: Normal rate and regular rhythm.     Heart sounds: Normal heart sounds.  Pulmonary:     Effort: Pulmonary effort is normal.     Breath sounds: Normal breath sounds.  Abdominal:     General: Bowel sounds are normal. There is no distension.     Palpations: Abdomen is soft.     Tenderness: There is no abdominal tenderness. There is no guarding.  Musculoskeletal:        General: Normal range of motion.     Cervical back: Normal range of motion and neck supple.  Lymphadenopathy:     Cervical: No cervical adenopathy.  Skin:    General: Skin is warm and dry.     Findings: No rash.  Neurological:     General: No focal deficit present.     Mental Status: She is alert and oriented to person, place, and time.     Cranial Nerves: No cranial nerve deficit.     Coordination: Coordination normal.  Psychiatric:        Mood and Affect: Mood normal.        Behavior: Behavior normal.      ------------------------------------------------------------------------------------------------------------------------------------------------------------------------------------------------------------------- Assessment and Plan  Well adult exam Well adult Orders Placed This Encounter  Procedures   COMPLETE METABOLIC PANEL WITH GFR   CBC with Differential   Lipid Panel w/reflex Direct LDL   TSH   Thyroglobulin Level   Thyroglobulin antibody   T4, free   T3, free   Ambulatory referral to Gastroenterology    Referral Priority:   Routine    Referral Type:   Consultation    Referral Reason:   Specialty Services Required    Number of Visits Requested:   1  Screenings:  Due for colon cancer screening-Referral entered to GI.  Additional screening per lab orders Immunizations:  UTD Anticipatory guidance/Risk factor reduction:  Recommendations per AVS.    No orders of the defined types  were placed in this encounter.   No follow-ups on file.    This visit occurred during the SARS-CoV-2 public health emergency.  Safety protocols were in place, including screening questions prior to the visit, additional usage of staff PPE, and extensive cleaning of exam room while observing appropriate contact time as indicated for disinfecting solutions.

## 2022-12-27 NOTE — Patient Instructions (Signed)
Preventive Care 40-45 Years Old, Female Preventive care refers to lifestyle choices and visits with your health care provider that can promote health and wellness. Preventive care visits are also called wellness exams. What can I expect for my preventive care visit? Counseling Your health care provider may ask you questions about your: Medical history, including: Past medical problems. Family medical history. Pregnancy history. Current health, including: Menstrual cycle. Method of birth control. Emotional well-being. Home life and relationship well-being. Sexual activity and sexual health. Lifestyle, including: Alcohol, nicotine or tobacco, and drug use. Access to firearms. Diet, exercise, and sleep habits. Work and work environment. Sunscreen use. Safety issues such as seatbelt and bike helmet use. Physical exam Your health care provider will check your: Height and weight. These may be used to calculate your BMI (body mass index). BMI is a measurement that tells if you are at a healthy weight. Waist circumference. This measures the distance around your waistline. This measurement also tells if you are at a healthy weight and may help predict your risk of certain diseases, such as type 2 diabetes and high blood pressure. Heart rate and blood pressure. Body temperature. Skin for abnormal spots. What immunizations do I need?  Vaccines are usually given at various ages, according to a schedule. Your health care provider will recommend vaccines for you based on your age, medical history, and lifestyle or other factors, such as travel or where you work. What tests do I need? Screening Your health care provider may recommend screening tests for certain conditions. This may include: Lipid and cholesterol levels. Diabetes screening. This is done by checking your blood sugar (glucose) after you have not eaten for a while (fasting). Pelvic exam and Pap test. Hepatitis B test. Hepatitis C  test. HIV (human immunodeficiency virus) test. STI (sexually transmitted infection) testing, if you are at risk. Lung cancer screening. Colorectal cancer screening. Mammogram. Talk with your health care provider about when you should start having regular mammograms. This may depend on whether you have a family history of breast cancer. BRCA-related cancer screening. This may be done if you have a family history of breast, ovarian, tubal, or peritoneal cancers. Bone density scan. This is done to screen for osteoporosis. Talk with your health care provider about your test results, treatment options, and if necessary, the need for more tests. Follow these instructions at home: Eating and drinking  Eat a diet that includes fresh fruits and vegetables, whole grains, lean protein, and low-fat dairy products. Take vitamin and mineral supplements as recommended by your health care provider. Do not drink alcohol if: Your health care provider tells you not to drink. You are pregnant, may be pregnant, or are planning to become pregnant. If you drink alcohol: Limit how much you have to 0-1 drink a day. Know how much alcohol is in your drink. In the [place] one drink equals one 12 oz bottle of beer (355 mL), one 5 oz glass of wine (148 mL), or one 1 oz glass of hard liquor (44 mL). Lifestyle Brush your teeth every morning and night with fluoride toothpaste. Floss one time each day. Exercise for at least 30 minutes 5 or more days each week. Do not use any products that contain nicotine or tobacco. These products include cigarettes, chewing tobacco, and vaping devices, such as e-cigarettes. If you need help quitting, ask your health care provider. Do not use drugs. If you are sexually active, practice safe sex. Use a condom or other form of protection to   prevent STIs. If you do not wish to become pregnant, use a form of birth control. If you plan to become pregnant, see your health care provider for a  prepregnancy visit. Take aspirin only as told by your health care provider. Make sure that you understand how much to take and what form to take. Work with your health care provider to find out whether it is safe and beneficial for you to take aspirin daily. Find healthy ways to manage stress, such as: Meditation, yoga, or listening to music. Journaling. Talking to a trusted person. Spending time with friends and family. Minimize exposure to UV radiation to reduce your risk of skin cancer. Safety Always wear your seat belt while driving or riding in a vehicle. Do not drive: If you have been drinking alcohol. Do not ride with someone who has been drinking. When you are tired or distracted. While texting. If you have been using any mind-altering substances or drugs. Wear a helmet and other protective equipment during sports activities. If you have firearms in your house, make sure you follow all gun safety procedures. Seek help if you have been physically or sexually abused. What's next? Visit your health care provider once a year for an annual wellness visit. Ask your health care provider how often you should have your eyes and teeth checked. Stay up to date on all vaccines. This information is not intended to replace advice given to you by your health care provider. Make sure you discuss any questions you have with your health care provider. Document Revised: 11/24/2020 Document Reviewed: 11/24/2020 Elsevier Patient Education  2024 Elsevier [employer].  

## 2022-12-29 LAB — CBC WITH DIFFERENTIAL/PLATELET
Absolute Monocytes: 544 cells/uL (ref 200–950)
Basophils Absolute: 70 cells/uL (ref 0–200)
Basophils Relative: 1.1 %
Eosinophils Absolute: 141 cells/uL (ref 15–500)
Eosinophils Relative: 2.2 %
HCT: 42.3 % (ref 35.0–45.0)
Hemoglobin: 14 g/dL (ref 11.7–15.5)
Lymphs Abs: 1645 cells/uL (ref 850–3900)
MCH: 30 pg (ref 27.0–33.0)
MCHC: 33.1 g/dL (ref 32.0–36.0)
MCV: 90.6 fL (ref 80.0–100.0)
MPV: 12.7 fL — ABNORMAL HIGH (ref 7.5–12.5)
Monocytes Relative: 8.5 %
Neutro Abs: 4000 cells/uL (ref 1500–7800)
Neutrophils Relative %: 62.5 %
Platelets: 173 10*3/uL (ref 140–400)
RBC: 4.67 10*6/uL (ref 3.80–5.10)
RDW: 13 % (ref 11.0–15.0)
Total Lymphocyte: 25.7 %
WBC: 6.4 10*3/uL (ref 3.8–10.8)

## 2022-12-29 LAB — LIPID PANEL W/REFLEX DIRECT LDL
Cholesterol: 177 mg/dL (ref <200)
HDL: 51 mg/dL (ref >=50)
LDL Cholesterol (Calc): 110 mg/dL (calc) — ABNORMAL HIGH
Non-HDL Cholesterol (Calc): 126 mg/dL (calc) (ref <130)
Total CHOL/HDL Ratio: 3.5 (calc) (ref <5.0)
Triglycerides: 70 mg/dL (ref <150)

## 2022-12-29 LAB — COMPLETE METABOLIC PANEL WITH GFR
AG Ratio: 1.7 (calc) (ref 1.0–2.5)
ALT: 14 U/L (ref 6–29)
AST: 16 U/L (ref 10–35)
Albumin: 4.3 g/dL (ref 3.6–5.1)
Alkaline phosphatase (APISO): 65 U/L (ref 31–125)
BUN: 13 mg/dL (ref 7–25)
CO2: 27 mmol/L (ref 20–32)
Calcium: 9.3 mg/dL (ref 8.6–10.2)
Chloride: 104 mmol/L (ref 98–110)
Creat: 0.81 mg/dL (ref 0.50–0.99)
Globulin: 2.6 g/dL (calc) (ref 1.9–3.7)
Glucose, Bld: 86 mg/dL (ref 65–99)
Potassium: 4.5 mmol/L (ref 3.5–5.3)
Sodium: 139 mmol/L (ref 135–146)
Total Bilirubin: 0.5 mg/dL (ref 0.2–1.2)
Total Protein: 6.9 g/dL (ref 6.1–8.1)
eGFR: 91 mL/min/{1.73_m2} (ref >=60)

## 2022-12-29 LAB — T3, FREE: T3, Free: 3.2 pg/mL (ref 2.3–4.2)

## 2022-12-29 LAB — T4, FREE: Free T4: 1.7 ng/dL (ref 0.8–1.8)

## 2022-12-29 LAB — THYROGLOBULIN LEVEL: Thyroglobulin: <0.1 ng/mL — ABNORMAL LOW

## 2022-12-29 LAB — THYROGLOBULIN ANTIBODY: Thyroglobulin Ab: <1 IU/mL (ref <=1)

## 2022-12-29 LAB — TSH: TSH: 3.55 mIU/L

## 2023-01-12 ENCOUNTER — Other Ambulatory Visit: Payer: Self-pay | Admitting: Family Medicine

## 2023-01-12 DIAGNOSIS — E890 Postprocedural hypothyroidism: Secondary | ICD-10-CM

## 2023-03-16 ENCOUNTER — Encounter: Payer: Self-pay | Admitting: Family Medicine

## 2023-04-02 ENCOUNTER — Encounter: Payer: Self-pay | Admitting: Family Medicine

## 2023-04-05 ENCOUNTER — Telehealth: Payer: Self-pay | Admitting: Family Medicine

## 2023-04-05 NOTE — Telephone Encounter (Signed)
Patient dropped off document  ADA Accommodations , to be filled out by provider. Patient requested to send it back via Call Patient to pick up within 5-days. Document is located in providers tray at front office.Please advise at [facility] [phone]

## 2023-04-10 NOTE — Telephone Encounter (Signed)
Patient came by and picked up a copy.

## 2023-04-12 NOTE — Telephone Encounter (Signed)
What is the update on this request? Thanks in advance.

## 2023-04-25 ENCOUNTER — Encounter: Payer: Self-pay | Admitting: Internal Medicine

## 2023-04-25 ENCOUNTER — Ambulatory Visit (AMBULATORY_SURGERY_CENTER): Payer: Commercial Managed Care - PPO

## 2023-04-25 VITALS — Ht 67.0 in | Wt 200.0 lb

## 2023-04-25 DIAGNOSIS — Z800 Family history of malignant neoplasm of digestive organs: Secondary | ICD-10-CM

## 2023-04-25 MED ORDER — NA SULFATE-K SULFATE-MG SULF 17.5-3.13-1.6 GM/177ML PO SOLN: 1.0000 | Freq: Once | ORAL | 0 refills | Status: AC

## 2023-04-25 NOTE — Progress Notes (Signed)
 No egg or soy allergy known to patient  No issues known to pt with past sedation with any surgeries or procedures Patient denies ever being told they had issues or difficulty with intubation  No FH of Malignant Hyperthermia Pt is not on diet pills Pt is not on  home 02  Pt is not on blood thinners  Pt denies issues with constipation  No A fib or A flutter Have any cardiac testing pending--no  LOA: independent  Prep: suprep   Patient's chart reviewed by Cathlyn Parsons CNRA prior to previsit and patient appropriate for the [facility].  Previsit completed and red dot placed by patient's name on their procedure day (on provider's schedule).     PV competed with patient. Prep instructions sent via mychart

## 2023-05-02 ENCOUNTER — Encounter: Payer: Self-pay | Admitting: Internal Medicine

## 2023-05-02 ENCOUNTER — Ambulatory Visit: Payer: Commercial Managed Care - PPO | Admitting: Internal Medicine

## 2023-05-02 VITALS — BP 104/66 | HR 60 | Temp 97.8°F | Resp 13 | Ht 67.0 in | Wt 200.0 lb

## 2023-05-02 DIAGNOSIS — Z1211 Encounter for screening for malignant neoplasm of colon: Secondary | ICD-10-CM

## 2023-05-02 DIAGNOSIS — E039 Hypothyroidism, unspecified: Secondary | ICD-10-CM | POA: Diagnosis not present

## 2023-05-02 DIAGNOSIS — K635 Polyp of colon: Secondary | ICD-10-CM | POA: Diagnosis not present

## 2023-05-02 DIAGNOSIS — Z800 Family history of malignant neoplasm of digestive organs: Secondary | ICD-10-CM

## 2023-05-02 DIAGNOSIS — D123 Benign neoplasm of transverse colon: Secondary | ICD-10-CM | POA: Diagnosis not present

## 2023-05-02 DIAGNOSIS — D120 Benign neoplasm of cecum: Secondary | ICD-10-CM

## 2023-05-02 DIAGNOSIS — E669 Obesity, unspecified: Secondary | ICD-10-CM | POA: Diagnosis not present

## 2023-05-02 MED ORDER — SODIUM CHLORIDE 0.9 % IV SOLN: 500.0000 mL | Freq: Once | INTRAVENOUS | Status: DC

## 2023-05-02 NOTE — Progress Notes (Signed)
Report to PACU, RN, vss, BBS= Clear.  

## 2023-05-02 NOTE — Patient Instructions (Signed)
Resume previous diet and medications. Awaiting pathology results. Repeat Colonoscopy date to be determined based on pathology results. Handouts provided on colon polyps  YOU HAD AN ENDOSCOPIC PROCEDURE TODAY AT THE  ENDOSCOPY CENTER:   Refer to the procedure report that was given to you for any specific questions about what was found during the examination.  If the procedure report does not answer your questions, please call your gastroenterologist to clarify.  If you requested that your care partner not be given the details of your procedure findings, then the procedure report has been included in a sealed envelope for you to review at your convenience later.  YOU SHOULD EXPECT: Some feelings of bloating in the abdomen. Passage of more gas than usual.  Walking can help get rid of the air that was put into your GI tract during the procedure and reduce the bloating. If you had a lower endoscopy (such as a colonoscopy or flexible sigmoidoscopy) you may notice spotting of blood in your stool or on the toilet paper. If you underwent a bowel prep for your procedure, you may not have a normal bowel movement for a few days.  Please Note:  You might notice some irritation and congestion in your nose or some drainage.  This is from the oxygen used during your procedure.  There is no need for concern and it should clear up in a day or so.  SYMPTOMS TO REPORT IMMEDIATELY:  Following lower endoscopy (colonoscopy or flexible sigmoidoscopy):  Excessive amounts of blood in the stool  Significant tenderness or worsening of abdominal pains  Swelling of the abdomen that is new, acute  Fever of 100F or higher  For urgent or emergent issues, a gastroenterologist can be reached at any hour by calling [phone]. Do not use MyChart messaging for urgent concerns.    DIET:  We do recommend a small meal at first, but then you may proceed to your regular diet.  Drink plenty of fluids but you should avoid  alcoholic beverages for 24 hours.  ACTIVITY:  You should plan to take it easy for the rest of today and you should NOT DRIVE or use heavy machinery until tomorrow (because of the sedation medicines used during the test).    FOLLOW UP: Our staff will call the number listed on your records the next business day following your procedure.  We will call around 7:15- 8:00 am to check on you and address any questions or concerns that you may have regarding the information given to you following your procedure. If we do not reach you, we will leave a message.     If any biopsies were taken you will be contacted by phone or by letter within the next 1-3 weeks.  Please call us at [phone] if you have not heard about the biopsies in 3 weeks.    SIGNATURES/CONFIDENTIALITY: You and/or your care partner have signed paperwork which will be entered into your electronic medical record.  These signatures attest to the fact that that the information above on your After Visit Summary has been reviewed and is understood.  Full responsibility of the confidentiality of this discharge information lies with you and/or your care-partner.

## 2023-05-02 NOTE — Progress Notes (Signed)
GASTROENTEROLOGY PROCEDURE H&P NOTE   Primary Care Physician: Everrett Coombe, DO    Reason for Procedure:  Colon cancer screen in setting of family history of colon cancer in patient's brother before age 45  Plan:    Colonoscopy  Patient is appropriate for endoscopic procedure(s) in the ambulatory ([facility]) setting.  The nature of the procedure, as well as the risks, benefits, and alternatives were carefully and thoroughly reviewed with the patient. Ample time for discussion and questions allowed. The patient understood, was satisfied, and agreed to proceed.     HPI: Betty Reyes is a 47 y.o. female who presents for screening colonoscopy.  Medical history as below.  Tolerated the prep.  No recent chest pain or shortness of breath.  No abdominal pain today.  Past Medical History:  Diagnosis Date   Bradycardia    Cancer (HCC)    Cervical polyp    Fatigue    History of radioactive iodine thyroid ablation    Hypothyroidism, postop    Obesity    Postpartum care following vaginal delivery (9/6) 02/16/2015   Thyroid cancer (HCC)    Thyroid disease    HYPOTHYROIDISM    Past Surgical History:  Procedure Laterality Date   DILATION AND CURETTAGE OF UTERUS N/A 02/25/2017   Procedure: DILATATION AND CURETTAGE;  Surgeon: Vick Frees, MD;  Location: [facility];  Service: Gynecology;  Laterality: N/A;   INTRAUTERINE DEVICE (IUD) INSERTION  04/09/2017   TOTAL THYROIDECTOMY      Prior to Admission medications   Medication Sig Start Date End Date Taking? Authorizing Provider  Ascorbic Acid (VITAMIN C) 500 MG CAPS Take 1 capsule by mouth daily at 6 (six) AM. Plus Zinc 14 mg 03/13/23  Yes [provider]  Multiple Vitamins-Minerals (MULTI ADULT GUMMIES PO) Take 1 tablet by mouth daily at 6 (six) AM. 06/12/22  Yes [provider]  SYNTHROID 125 MCG tablet TAKE 1 TABLET FOR 5 DAYS EACH WEEK AND 2 TABLETS 2 DAYS EACH WEEK FOR HYPOTHYROIDISM 01/12/23  Yes  Everrett Coombe, DO    Current Outpatient Medications  Medication Sig Dispense Refill   Ascorbic Acid (VITAMIN C) 500 MG CAPS Take 1 capsule by mouth daily at 6 (six) AM. Plus Zinc 14 mg     Multiple Vitamins-Minerals (MULTI ADULT GUMMIES PO) Take 1 tablet by mouth daily at 6 (six) AM.     SYNTHROID 125 MCG tablet TAKE 1 TABLET FOR 5 DAYS EACH WEEK AND 2 TABLETS 2 DAYS EACH WEEK FOR HYPOTHYROIDISM 108 tablet 0   Current Facility-Administered Medications  Medication Dose Route Frequency Provider Last Rate Last Admin   0.9 %  sodium chloride infusion  500 mL Intravenous Once Latwan Luchsinger, Carie Caddy, MD        Allergies as of 05/02/2023   (No Known Allergies)    Family History  Problem Relation Age of Onset   Cancer Mother    Hypertension Father    Hyperlipidemia Father    Heart disease Father        HEART ATTACK   Heart attack Father    Colon cancer Brother 43   Cancer Brother    Stomach cancer Maternal Uncle    Heart attack Paternal Uncle    Hypertension Paternal Uncle    Diabetes Maternal Grandfather    Hypothyroidism Paternal Grandmother    Heart attack Paternal Grandmother    Hypertension Paternal Grandmother    Heart attack Paternal Grandfather    Hypertension Paternal Actor  Rectal cancer Neg Hx     Social History   Socioeconomic History   Marital status: Married    Spouse name: Not on file   Number of children: Not on file   Years of education: Not on file   Highest education level: Not on file  Occupational History   Not on file  Tobacco Use   Smoking status: Never   Smokeless tobacco: Never  Vaping Use   Vaping status: Never Used  Substance and Sexual Activity   Alcohol use: No   Drug use: No   Sexual activity: Yes    Partners: Male  Other Topics Concern   Not on file  Social History Narrative   Patient is a Designer, 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   Social Determinants of Health   Financial Resource  Strain: Not on file  Food Insecurity: Not on file  Transportation Needs: Not on file  Physical Activity: Not on file  Stress: Not on file  Social Connections: Not on file  Intimate Partner Violence: Not on file    Physical Exam: Vital signs in last 24 hours: @BP  126/71   Pulse 67   Temp 97.8 F (36.6 C) (Temporal)   Resp 13   Ht 5\' 7"  (1.702 m)   Wt 200 lb (90.7 kg)   SpO2 98%   BMI 31.32 kg/m  GEN: NAD EYE: Sclerae anicteric ENT: MMM CV: Non-tachycardic Pulm: CTA b/l GI: Soft, NT/ND NEURO:  Alert & Oriented x 3   Erick Blinks, MD [facility]  05/02/2023 10:22 AM

## 2023-05-02 NOTE — Progress Notes (Signed)
Called to room to assist during endoscopic procedure.  Patient ID and intended procedure confirmed with present staff. Received instructions for my participation in the procedure from the performing physician.  

## 2023-05-02 NOTE — Op Note (Signed)
[facility] Patient Name: Betty Reyes Procedure Date: 05/02/2023 10:19 AM MRN: [id number] Endoscopist: Beverley Fiedler , MD, [id number] Age: 47 Referring MD:  Date of Birth: May 29, 1976 Gender: Female Account #: [id number] Procedure:                Colonoscopy Indications:              Screening in patient at increased risk: Family                            history of 1st-degree relative with colorectal                            cancer before age 49 years (brother age 35) Medicines:                Monitored Anesthesia Care Procedure:                Pre-Anesthesia Assessment:                           - Prior to the procedure, a History and Physical                            was performed, and patient medications and                            allergies were reviewed. The patient's tolerance of                            previous anesthesia was also reviewed. The risks                            and benefits of the procedure and the sedation                            options and risks were discussed with the patient.                            All questions were answered, and informed consent                            was obtained. Prior Anticoagulants: The patient has                            taken no anticoagulant or antiplatelet agents. ASA                            Grade Assessment: II - A patient with mild systemic                            disease. After reviewing the risks and benefits,                            the patient was deemed in satisfactory condition to  undergo the procedure.                           After obtaining informed consent, the colonoscope                            was passed under direct vision. Throughout the                            procedure, the patient's blood pressure, pulse, and                            oxygen saturations were monitored continuously. The                            Olympus CF-HQ190L  ([id number]) Colonoscope was                            introduced through the anus and advanced to the                            cecum. The colonoscopy was performed without                            difficulty. The patient tolerated the procedure                            well. The quality of the bowel preparation was                            good. The ileocecal valve, appendiceal orifice, and                            rectum were photographed. Scope In: 10:33:26 AM Scope Out: 10:54:59 AM Scope Withdrawal Time: 0 hours 18 minutes 44 seconds  Total Procedure Duration: 0 hours 21 minutes 33 seconds  Findings:                 The digital rectal exam was normal.                           An 8 mm polyp was found in the cecum. The polyp was                            sessile. The polyp was removed with a cold snare.                            Resection and retrieval were complete.                           A 5 mm polyp was found in the transverse colon. The                            polyp was sessile. The polyp was removed with a  cold snare. Resection and retrieval were complete.                           The exam was otherwise without abnormality on                            direct and retroflexion views. Complications:            No immediate complications. Estimated Blood Loss:     Estimated blood loss: none. Impression:               - One 8 mm polyp in the cecum, removed with a cold                            snare. Resected and retrieved.                           - One 5 mm polyp in the transverse colon, removed                            with a cold snare. Resected and retrieved.                           - The examination was otherwise normal on direct                            and retroflexion views. Recommendation:           - Patient has a contact number available for                            emergencies. The signs and symptoms of potential                             delayed complications were discussed with the                            patient. Return to normal activities tomorrow.                            Written discharge instructions were provided to the                            patient.                           - Resume previous diet.                           - Continue present medications.                           - Await pathology results.                           - Repeat colonoscopy in 5 years for surveillance. Beverley Fiedler, MD 05/02/2023  10:57:58 AM This report has been signed electronically.

## 2023-05-02 NOTE — Progress Notes (Signed)
Pt's states no medical or surgical changes since previsit or office visit. 

## 2023-05-03 ENCOUNTER — Telehealth: Payer: Self-pay | Admitting: *Deleted

## 2023-05-03 NOTE — Telephone Encounter (Signed)
  Follow up Call-     05/02/2023   10:01 AM  Call back number  Post procedure Call Back phone  # [phone]  Permission to leave phone message Yes     Patient questions:  Do you have a fever, pain , or abdominal swelling? No. Pain Score  0 *  Have you tolerated food without any problems? Yes.    Have you been able to return to your normal activities? Yes.    Do you have any questions about your discharge instructions: Diet   No. Medications  No. Follow up visit  No.  Do you have questions or concerns about your Care? No.  Actions: * If pain score is 4 or above: No action needed, pain <4.

## 2023-05-04 ENCOUNTER — Encounter: Payer: Self-pay | Admitting: Internal Medicine

## 2023-05-04 LAB — SURGICAL PATHOLOGY

## 2023-05-31 ENCOUNTER — Other Ambulatory Visit (HOSPITAL_COMMUNITY): Payer: Self-pay

## 2023-06-02 ENCOUNTER — Other Ambulatory Visit: Payer: Self-pay | Admitting: Family Medicine

## 2023-06-02 DIAGNOSIS — E890 Postprocedural hypothyroidism: Secondary | ICD-10-CM

## 2023-07-19 ENCOUNTER — Ambulatory Visit: Payer: Self-pay | Admitting: Family Medicine

## 2023-07-19 DIAGNOSIS — B349 Viral infection, unspecified: Secondary | ICD-10-CM | POA: Diagnosis not present

## 2023-07-19 NOTE — Telephone Encounter (Signed)
 Betty Reyes states she is trying to do a virtual urgent care at the moment. If she doesn't get an appointment she will call back.

## 2023-07-19 NOTE — Telephone Encounter (Signed)
 This RN attempted to contact caller at [phone] for triage, call cannot be completed as dialed. Forwarding to clinic for review.  Copied from CRM [id number]. Topic: Clinical - Medical Advice >> Jul 19, 2023  9:36 AM Betty Reyes ORN wrote: Reason for CRM: patient home very sick have flu headache bodyache , fever, patient is requesting the medication Tamiflu and want to know if will prescribe medication for her daughter who is not a patient at the office  her Son just got over the flu   Please call patient  [phone] Patient using  [facility] in [place] -[address] , [place] [address] Phone  [phone] Reason for Disposition  Unable to complete triage due to phone connection issues  Answer Assessment - Initial Assessment Questions N/A Unable to reach caller, number on chart not accepting calls.  Protocols used: No Contact or Duplicate Contact Call-A-AH

## 2023-08-09 ENCOUNTER — Ambulatory Visit: Payer: Self-pay | Admitting: Family Medicine

## 2023-08-09 ENCOUNTER — Ambulatory Visit: Payer: Medicaid Other | Admitting: Family Medicine

## 2023-08-09 ENCOUNTER — Encounter: Payer: Self-pay | Admitting: Family Medicine

## 2023-08-09 VITALS — BP 120/87 | HR 81 | Ht 67.13 in | Wt 198.5 lb

## 2023-08-09 DIAGNOSIS — H8103 Meniere's disease, bilateral: Secondary | ICD-10-CM | POA: Diagnosis not present

## 2023-08-09 DIAGNOSIS — R42 Dizziness and giddiness: Secondary | ICD-10-CM

## 2023-08-09 MED ORDER — PROMETHAZINE HCL 25 MG RE SUPP: 25.0000 mg | Freq: Four times a day (QID) | RECTAL | 0 refills | Status: AC | PRN

## 2023-08-09 MED ORDER — ONDANSETRON 8 MG PO TBDP: 8.0000 mg | ORAL_TABLET | Freq: Three times a day (TID) | ORAL | 3 refills | Status: AC | PRN

## 2023-08-09 MED ORDER — PROMETHAZINE HCL 25 MG/ML IJ SOLN
25.0000 mg | Freq: Once | INTRAMUSCULAR | Status: AC
Start: 2023-08-09 — End: 2023-08-09
  Administered 2023-08-09: 25 mg via INTRAMUSCULAR

## 2023-08-09 MED ORDER — DIAZEPAM 5 MG PO TABS
5.0000 mg | ORAL_TABLET | Freq: Two times a day (BID) | ORAL | 1 refills | Status: AC | PRN
Start: 2023-08-09 — End: ?

## 2023-08-09 NOTE — Progress Notes (Signed)
 Betty Reyes - 48 y.o. female MRN [id number]  Date of birth: Oct 17, 1975  Subjective Chief Complaint  Patient presents with   Dizziness    X1day     HPI Betty Reyes is a 48 y.o. female here today with complaint of vertigo.  She has history of Meniere's disease but has has not had a flare of this in quite some time.  Her current symptoms started initially a few days ago.  Treated with meclizine and Zofran with improvement of symptoms however they returned again today.  Unable to really keep anything down so decided to come in for visit.  She has had to go to the ER in the past for this and was given cocktail of diazepam, Zofran and promethazine.  She was on hydrochlorothiazide in the past when she was having recurrent episodes.  This was discontinued as her symptoms had improved.    ROS:  A comprehensive ROS was completed and negative except as noted per HPI   No Known Allergies  Past Medical History:  Diagnosis Date   Bradycardia    Cancer (HCC)    Cervical polyp    Fatigue    History of radioactive iodine thyroid ablation    Hypothyroidism, postop    Obesity    Postpartum care following vaginal delivery (9/6) 02/16/2015   Thyroid cancer (HCC)    Thyroid disease    HYPOTHYROIDISM    Past Surgical History:  Procedure Laterality Date   DILATION AND CURETTAGE OF UTERUS N/A 02/25/2017   Procedure: DILATATION AND CURETTAGE;  Surgeon: Vick Frees, MD;  Location: [facility];  Service: Gynecology;  Laterality: N/A;   INTRAUTERINE DEVICE (IUD) INSERTION  04/09/2017   TOTAL THYROIDECTOMY      Social History   Socioeconomic History   Marital status: Married    Spouse name: Not on file   Number of children: Not on file   Years of education: Not on file   Highest education level: Not on file  Occupational History   Not on file  Tobacco Use   Smoking status: Never   Smokeless tobacco: Never  Vaping Use   Vaping status: Never Used  Substance and Sexual  Activity   Alcohol use: No   Drug use: No   Sexual activity: Yes    Partners: Male  Other Topics Concern   Not on file  Social History Narrative   Patient is a Designer, 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   Social Drivers of Corporate investment banker Strain: Not on file  Food Insecurity: Not on file  Transportation Needs: Not on file  Physical Activity: Not on file  Stress: Not on file  Social Connections: Not on file    Family History  Problem Relation Age of Onset   Cancer Mother    Hypertension Father    Hyperlipidemia Father    Heart disease Father        HEART ATTACK   Heart attack Father    Colon cancer Brother 68   Cancer Brother    Stomach cancer Maternal Uncle    Heart attack Paternal Uncle    Hypertension Paternal Uncle    Diabetes Maternal Grandfather    Hypothyroidism Paternal Grandmother    Heart attack Paternal Grandmother    Hypertension Paternal Grandmother    Heart attack Paternal Grandfather    Hypertension Paternal Grandfather    Rectal cancer Neg Hx     Health Maintenance  Topic Date Due   COVID-19 Vaccine (3 - Pfizer risk series) 04/09/2020   INFLUENZA VACCINE  01/11/2023   Hepatitis C Screening  12/27/2023 (Originally 08/17/1995)   MAMMOGRAM  10/25/2023   DTaP/Tdap/Td (2 - Td or Tdap) 06/12/2026   Cervical Cancer Screening (HPV/Pap Cotest)  10/24/2027   Colonoscopy  05/01/2028   HIV Screening  Completed   HPV VACCINES  Aged Out     ----------------------------------------------------------------------------------------------------------------------------------------------------------------------------------------------------------------- Physical Exam BP 120/87 (BP Location: Left Arm, Patient Position: Sitting, Cuff Size: Large)   Pulse 81   Ht 5' 7.13" (1.705 m)   Wt 198 lb 8 oz (90 kg)   SpO2 98%   BMI 30.97 kg/m   Physical Exam Constitutional:      Appearance: Normal appearance.  HENT:      Head: Normocephalic and atraumatic.  Eyes:     General: No scleral icterus. Cardiovascular:     Rate and Rhythm: Normal rate and regular rhythm.  Pulmonary:     Effort: Pulmonary effort is normal.     Breath sounds: Normal breath sounds.  Neurological:     General: No focal deficit present.     Mental Status: She is alert.     Comments: 2-3 beats of horizontal nystagmus with some rotational nystagmus as well.  No vertical nystagmus noted.  Psychiatric:        Mood and Affect: Mood normal.        Behavior: Behavior normal.     ------------------------------------------------------------------------------------------------------------------------------------------------------------------------------------------------------------------- Assessment and Plan  Meniere's disease of both ears She has had a couple of episodes over the past few days.  Given injection of promethazine in clinic today.  Prescriptions for diazepam, Zofran and promethazine suppositories sent in to use for abortive measures if she experiences continued episodes at home.  We may need to consider adding HCTZ back on and/or refer back to ENT.   Meds ordered this encounter  Medications   diazepam (VALIUM) 5 MG tablet    Sig: Take 1 tablet (5 mg total) by mouth every 12 (twelve) hours as needed (vertigo).    Dispense:  30 tablet    Refill:  1   promethazine (PHENERGAN) 25 MG suppository    Sig: Place 1 suppository (25 mg total) rectally every 6 (six) hours as needed for nausea or vomiting.    Dispense:  12 each    Refill:  0   ondansetron (ZOFRAN-ODT) 8 MG disintegrating tablet    Sig: Take 1 tablet (8 mg total) by mouth every 8 (eight) hours as needed for nausea or vomiting.    Dispense:  30 tablet    Refill:  3   promethazine (PHENERGAN) injection 25 mg    No follow-ups on file.    This visit occurred during the SARS-CoV-2 public health emergency.  Safety protocols were in place, including screening  questions prior to the visit, additional usage of staff PPE, and extensive cleaning of exam room while observing appropriate contact time as indicated for disinfecting solutions.

## 2023-08-09 NOTE — Telephone Encounter (Signed)
 Chief Complaint: Vomiting Symptoms: Nausea, dizzines Frequency: Intermittent Pertinent Negatives: Patient denies abdomen pain, headache Disposition: [] ED /[] Urgent Care (no appt availability in office) / [x] Appointment(In office/virtual)/ []  [facility]/ [] Home Care/ [] Refused Recommended Disposition /[] [facility]/ []  Follow-up with PCP Additional Notes: Spoke with pt's aunt, Meriam Sprague. Pt has Meniere's disease per aunt. Pt aunt states pt has not had symptoms for about 3 years. On Sat night pt was vomiting. Pt aunt states she found some meclizine in her cabinet that she gave her. Pt had expired Zofran which aunt gave her. Pt aunt states pt threw up the medications. Pt aunt then gave pt some nausea medication from her deceased husband. Pt aunt states this helped with the nausea and vomiting. Pt aunt called triage on Saturday night and on call doctor recommended ED. Pt refused. Pt was given an antihistamine by aunt to help her sleep Sat night. On Sun pt seemed to be doing better. Every day this week pt was doing well until today. Pt has not thrown up yet but is laying down. Pt aunt states it pt sits up she will get nauseous and vomit. Pt aunt states pt had the flu at beginning of Feb so not sure if this is causing it. Pt scheduled for an appointment today. This RN educated pt aunt on home care, new-worsening symptoms, when to call back/seek emergent care. Pt aunt verbalized understanding and agrees to plan.    Copied from CRM [id number]. Topic: Clinical - Red Word Triage >> Aug 09, 2023 12:13 PM Nila Nephew wrote: Red Word that prompted transfer to Nurse Triage: Patient is non-stop vomiting. Patient is unable to keep down anti-nausea medication. Patient had a bout of this over the weekend, where aunt states that on clal provider refused to provide care for her. Patient re-lapsed again today and is unable to keep anything down. Reason for Disposition  [1] MILD or MODERATE vomiting AND [2]  present > 48 hours (2 days) (Exception: Mild vomiting with associated diarrhea.)  Answer Assessment - Initial Assessment Questions 1. VOMITING SEVERITY: "How many times have you vomited in the past 24 hours?"     - MILD:  1 - 2 times/day    - MODERATE: 3 - 5 times/day, decreased oral intake without significant weight loss or symptoms of dehydration    - SEVERE: 6 or more times/day, vomits everything or nearly everything, with significant weight loss, symptoms of dehydration      0 2. ONSET: "When did the vomiting begin?"      Saturday and was fine by Sunday 3. ABDOMEN PAIN: "Are your having any abdomen pain?" If Yes : "How bad is it and what does it feel like?" (e.g., crampy, dull, intermittent, constant)      Denies  Protocols used: Vomiting-A-AH

## 2023-08-09 NOTE — Assessment & Plan Note (Signed)
 She has had a couple of episodes over the past few days.  Given injection of promethazine in clinic today.  Prescriptions for diazepam, Zofran and promethazine suppositories sent in to use for abortive measures if she experiences continued episodes at home.  We may need to consider adding HCTZ back on and/or refer back to ENT.

## 2023-08-09 NOTE — Patient Instructions (Signed)
Mnire's Disease  Mnire's disease is a condition that affects your inner ear. It often affects just one ear, but it can affect both. It's a lifelong condition that may get worse over time. But there are treatments that can help you manage your symptoms. What are the causes? In your inner ear, there's a fluid called endolymph. If that fluid builds up, it can affect the nerves that help you balance and hear. This can cause Mnire's disease. The reason for the fluid buildup isn't known. But it may be caused by: Allergies. A problem with your body's defense, or immune, system. An infection of your inner ear. A head injury. What increases the risk? You may be more likely to get Mnire's disease if: You're 88-70 years old. Someone else in your family has the condition. You have an autoimmune disease. This kind of disease attacks your immune system. You have a head injury. What are the signs or symptoms? Symptoms may include: A feeling of fullness or pressure in your ear. Tinnitus. This is a ringing or buzzing in your ear. Vertigo. This is when things feel like they're spinning when they're not. Dizziness or loss of balance. Hearing loss. Nausea and vomiting. This is rare. Symptoms may come and go. They may last for 20 minutes or for many hours at a time. They may get worse over time. How is this diagnosed? Mnire's disease may be diagnosed based on an exam and tests. Tests may include: A hearing test called an audiogram. An electronystagmogram (ENG). This tests the nerve that helps you balance. Imaging studies, such as MRI. Other balance tests. How is this treated? There's no cure. But treatment can help you manage your symptoms. You may need to: Take in less salt. Salt is also called sodium. A low-salt diet can reduce fluid in your body. This can help relieve symptoms. Take medicines. These may include medicines to help with: Vertigo. Nausea. How much fluid your body is holding  on to. Use an air pressure pulse generator. This is a machine that sends small pressure pulses into your ear. Get shots through your eardrum. These can help with vertigo. Wear hearing aids. Have inner ear surgery. This is rare. Have therapy to help you manage the stress of living with this condition. Follow these instructions at home: Eating and drinking Limit how much salt you take in as told by your health care provider. You may be told to limit your intake to 1,500-2,000 mg per day. Check foods and drinks to see how much salt is in them. Take in less caffeine. Try to avoid it if you can. Do not drink alcohol. Drink enough fluid to keep your pee (urine) pale yellow. General instructions Take over-the-counter and prescription medicines only as told by your provider. Find ways to reduce stress. These may include meditation, yoga, or deep breathing. Do not drive if you're dizzy or have vertigo. Do not use any products that contain nicotine or tobacco. These products include cigarettes, chewing tobacco, and vaping devices, such as e-cigarettes. If you need help quitting, ask your provider. Where to find more information American Academy of Otolaryngology-Head and Neck Surgery Foundation: enthealth.org American Training and development officer (AHRF): american-hearing.org Contact a health care provider if: Your symptoms last longer than 4 hours. You have new or worse symptoms. You've been vomiting for 24 hours. You can't keep fluids down. You can't manage the stress of living with Mnire's disease. Get help right away if: You have chest pain. You have trouble breathing.  These symptoms may be an emergency. Get help right away. Call 911. Do not wait to see if the symptoms will go away. Do not drive yourself to the hospital. This information is not intended to replace advice given to you by your health care provider. Make sure you discuss any questions you have with your health care  provider. Document Revised: 09/04/2022 Document Reviewed: 09/04/2022 Elsevier Patient Education  2024 [employer].

## 2023-08-09 NOTE — Telephone Encounter (Signed)
 Patient seen in office today with Betty Reyes

## 2023-09-12 ENCOUNTER — Other Ambulatory Visit: Payer: Self-pay | Admitting: Medical-Surgical

## 2023-09-12 DIAGNOSIS — Z1231 Encounter for screening mammogram for malignant neoplasm of breast: Secondary | ICD-10-CM

## 2023-10-25 ENCOUNTER — Other Ambulatory Visit: Payer: Self-pay | Admitting: Family Medicine

## 2023-10-25 ENCOUNTER — Other Ambulatory Visit: Payer: Self-pay

## 2023-10-25 DIAGNOSIS — E890 Postprocedural hypothyroidism: Secondary | ICD-10-CM

## 2023-10-25 MED ORDER — SYNTHROID 125 MCG PO TABS: ORAL_TABLET | ORAL | 0 refills | Status: DC

## 2023-10-31 ENCOUNTER — Ambulatory Visit (INDEPENDENT_AMBULATORY_CARE_PROVIDER_SITE_OTHER)

## 2023-10-31 DIAGNOSIS — Z1231 Encounter for screening mammogram for malignant neoplasm of breast: Secondary | ICD-10-CM | POA: Diagnosis not present

## 2023-11-02 ENCOUNTER — Encounter: Payer: Self-pay | Admitting: Family Medicine

## 2023-11-02 DIAGNOSIS — E890 Postprocedural hypothyroidism: Secondary | ICD-10-CM

## 2023-11-02 MED ORDER — SYNTHROID 125 MCG PO TABS: ORAL_TABLET | ORAL | 0 refills | Status: DC

## 2023-11-06 ENCOUNTER — Ambulatory Visit: Payer: Self-pay | Admitting: Medical-Surgical

## 2023-12-18 ENCOUNTER — Encounter: Payer: Self-pay | Admitting: Family Medicine

## 2024-01-15 ENCOUNTER — Other Ambulatory Visit: Payer: Self-pay | Admitting: Family Medicine

## 2024-01-15 DIAGNOSIS — E890 Postprocedural hypothyroidism: Secondary | ICD-10-CM

## 2024-01-28 ENCOUNTER — Encounter: Payer: Self-pay | Admitting: Medical-Surgical

## 2024-01-28 ENCOUNTER — Ambulatory Visit (INDEPENDENT_AMBULATORY_CARE_PROVIDER_SITE_OTHER): Admitting: Medical-Surgical

## 2024-01-28 VITALS — BP 134/81 | HR 63 | Resp 20 | Ht 67.13 in | Wt 204.1 lb

## 2024-01-28 DIAGNOSIS — Z0000 Encounter for general adult medical examination without abnormal findings: Secondary | ICD-10-CM | POA: Diagnosis not present

## 2024-01-28 DIAGNOSIS — E7800 Pure hypercholesterolemia, unspecified: Secondary | ICD-10-CM

## 2024-01-28 DIAGNOSIS — Z30432 Encounter for removal of intrauterine contraceptive device: Secondary | ICD-10-CM

## 2024-01-28 DIAGNOSIS — E890 Postprocedural hypothyroidism: Secondary | ICD-10-CM

## 2024-01-28 MED ORDER — SYNTHROID 125 MCG PO TABS: ORAL_TABLET | ORAL | 0 refills | Status: DC

## 2024-01-28 NOTE — Patient Instructions (Signed)
 Preventive Care 46-46 Years Old, Female  Preventive care refers to lifestyle choices and visits with your health care provider that can promote health and wellness. Preventive care visits are also called wellness exams.  What can I expect for my preventive care visit?  Counseling  Your health care provider may ask you questions about your:  Medical history, including:  Past medical problems.  Family medical history.  Pregnancy history.  Current health, including:  Menstrual cycle.  Method of birth control.  Emotional well-being.  Home life and relationship well-being.  Sexual activity and sexual health.  Lifestyle, including:  Alcohol, nicotine or tobacco, and drug use.  Access to firearms.  Diet, exercise, and sleep habits.  Work and work Astronomer.  Sunscreen use.  Safety issues such as seatbelt and bike helmet use.  Physical exam  Your health care provider will check your:  Height and weight. These may be used to calculate your BMI (body mass index). BMI is a measurement that tells if you are at a healthy weight.  Waist circumference. This measures the distance around your waistline. This measurement also tells if you are at a healthy weight and may help predict your risk of certain diseases, such as type 2 diabetes and high blood pressure.  Heart rate and blood pressure.  Body temperature.  Skin for abnormal spots.  What immunizations do I need?    Vaccines are usually given at various ages, according to a schedule. Your health care provider will recommend vaccines for you based on your age, medical history, and lifestyle or other factors, such as travel or where you work.  What tests do I need?  Screening  Your health care provider may recommend screening tests for certain conditions. This may include:  Lipid and cholesterol levels.  Diabetes screening. This is done by checking your blood sugar (glucose) after you have not eaten for a while (fasting).  Pelvic exam and Pap test.  Hepatitis B test.  Hepatitis C  test.  HIV (human immunodeficiency virus) test.  STI (sexually transmitted infection) testing, if you are at risk.  Lung cancer screening.  Colorectal cancer screening.  Mammogram. Talk with your health care provider about when you should start having regular mammograms. This may depend on whether you have a family history of breast cancer.  BRCA-related cancer screening. This may be done if you have a family history of breast, ovarian, tubal, or peritoneal cancers.  Bone density scan. This is done to screen for osteoporosis.  Talk with your health care provider about your test results, treatment options, and if necessary, the need for more tests.  Follow these instructions at home:  Eating and drinking    Eat a diet that includes fresh fruits and vegetables, whole grains, lean protein, and low-fat dairy products.  Take vitamin and mineral supplements as recommended by your health care provider.  Do not drink alcohol if:  Your health care provider tells you not to drink.  You are pregnant, may be pregnant, or are planning to become pregnant.  If you drink alcohol:  Limit how much you have to 0-1 drink a day.  Know how much alcohol is in your drink. In the [place] one drink equals one 12 oz bottle of beer (355 mL), one 5 oz glass of wine (148 mL), or one 1 oz glass of hard liquor (44 mL).  Lifestyle  Brush your teeth every morning and night with fluoride toothpaste. Floss one time each day.  Exercise for at least  30 minutes 5 or more days each week.  Do not use any products that contain nicotine or tobacco. These products include cigarettes, chewing tobacco, and vaping devices, such as e-cigarettes. If you need help quitting, ask your health care provider.  Do not use drugs.  If you are sexually active, practice safe sex. Use a condom or other form of protection to prevent STIs.  If you do not wish to become pregnant, use a form of birth control. If you plan to become pregnant, see your health care provider for a  prepregnancy visit.  Take aspirin only as told by your health care provider. Make sure that you understand how much to take and what form to take. Work with your health care provider to find out whether it is safe and beneficial for you to take aspirin daily.  Find healthy ways to manage stress, such as:  Meditation, yoga, or listening to music.  Journaling.  Talking to a trusted person.  Spending time with friends and family.  Minimize exposure to UV radiation to reduce your risk of skin cancer.  Safety  Always wear your seat belt while driving or riding in a vehicle.  Do not drive:  If you have been drinking alcohol. Do not ride with someone who has been drinking.  When you are tired or distracted.  While texting.  If you have been using any mind-altering substances or drugs.  Wear a helmet and other protective equipment during sports activities.  If you have firearms in your house, make sure you follow all gun safety procedures.  Seek help if you have been physically or sexually abused.  What's next?  Visit your health care provider once a year for an annual wellness visit.  Ask your health care provider how often you should have your eyes and teeth checked.  Stay up to date on all vaccines.  This information is not intended to replace advice given to you by your health care provider. Make sure you discuss any questions you have with your health care provider.  Document Revised: 11/24/2020 Document Reviewed: 11/24/2020  Elsevier Patient Education  2024 [employer].

## 2024-01-28 NOTE — Progress Notes (Signed)
 Complete physical exam  Patient: Betty Reyes   DOB: 05/16/1976   48 y.o. Female  MRN: [id number]  Subjective:    Chief Complaint  Patient presents with   Annual Exam   IUD REMOVAL    Betty Reyes is a 48 y.o. female who presents today for a complete physical exam. She reports consuming a general diet. Swimming and mowing 4 acres is the basis for exercise. She generally feels well. She reports sleeping poorly. She does not have additional problems to discuss today.    Most recent fall risk assessment:    10/24/2022    9:42 AM  Fall Risk   Falls in the past year? 0  Number falls in past yr: 0  Injury with Fall? 0  Risk for fall due to : No Fall Risks  Follow up Falls evaluation completed     Most recent depression screenings:    01/28/2024    9:55 AM 10/24/2022    9:42 AM  PHQ 2/9 Scores  PHQ - 2 Score 0 0    Vision:Within last year and Dental: No current dental problems and Receives regular dental care    Patient Care Team: Alvia Bring, DO as PCP - General (Family Medicine) Drury Ned, MD as Consulting Physician (Gynecology)   Outpatient Medications Prior to Visit  Medication Sig   Ascorbic Acid (VITAMIN C) 500 MG CAPS Take 1 capsule by mouth daily at 6 (six) AM. Plus Zinc 14 mg   diazepam  (VALIUM ) 5 MG tablet Take 1 tablet (5 mg total) by mouth every 12 (twelve) hours as needed (vertigo).   meclizine (ANTIVERT) 25 MG tablet Take 25 mg by mouth 2 (two) times daily.   Multiple Vitamins-Minerals (MULTI ADULT GUMMIES PO) Take 1 tablet by mouth daily at 6 (six) AM.   ondansetron  (ZOFRAN -ODT) 8 MG disintegrating tablet Take 1 tablet (8 mg total) by mouth every 8 (eight) hours as needed for nausea or vomiting.   promethazine  (PHENERGAN ) 25 MG suppository Place 1 suppository (25 mg total) rectally every 6 (six) hours as needed for nausea or vomiting.   [DISCONTINUED] SYNTHROID  125 MCG tablet TAKE 1 TABLET FOR 5 DAYS EACH WEEK AND 2 TABLETS 2 DAYS EACH WEEK  FOR HYPOTHYROIDISM   [DISCONTINUED] fluticasone (FLONASE) 50 MCG/ACT nasal spray Place 1 spray into the nose.   No facility-administered medications prior to visit.    Review of Systems  Constitutional:  Negative for chills, fever, malaise/fatigue and weight loss.  HENT:  Negative for congestion, ear pain, hearing loss, sinus pain and sore throat.   Eyes:  Negative for blurred vision, photophobia and pain.  Respiratory:  Negative for cough, shortness of breath and wheezing.   Cardiovascular:  Negative for chest pain, palpitations and leg swelling.  Gastrointestinal:  Negative for abdominal pain, constipation, diarrhea, heartburn, nausea and vomiting.  Genitourinary:  Negative for dysuria, frequency and urgency.  Musculoskeletal:  Negative for falls and neck pain.  Skin:  Negative for itching and rash.  Neurological:  Negative for dizziness, weakness and headaches.  Endo/Heme/Allergies:  Negative for polydipsia. Does not bruise/bleed easily.  Psychiatric/Behavioral:  Positive for depression. Negative for substance abuse and suicidal ideas. The patient has insomnia. The patient is not nervous/anxious.      Objective:    BP 134/81 (BP Location: Left Arm, Cuff Size: Normal)   Pulse 63   Resp 20   Ht 5' 7.13 (1.705 m)   Wt 204 lb 1.3 oz (92.6 kg)   SpO2 98%  BMI 31.84 kg/m    Physical Exam Vitals reviewed. Exam conducted with a chaperone present.  Constitutional:      General: She is not in acute distress.    Appearance: Normal appearance. She is not ill-appearing.  HENT:     Head: Normocephalic and atraumatic.     Right Ear: Tympanic membrane, ear canal and external ear normal. There is no impacted cerumen.     Left Ear: Tympanic membrane, ear canal and external ear normal. There is no impacted cerumen.     Nose: Nose normal. No congestion or rhinorrhea.     Mouth/Throat:     Mouth: Mucous membranes are moist.     Pharynx: No oropharyngeal exudate or posterior oropharyngeal  erythema.  Eyes:     General: No scleral icterus.       Right eye: No discharge.        Left eye: No discharge.     Extraocular Movements: Extraocular movements intact.     Conjunctiva/sclera: Conjunctivae normal.     Pupils: Pupils are equal, round, and reactive to light.  Neck:     Thyroid : No thyromegaly.     Vascular: No carotid bruit or JVD.     Trachea: Trachea normal.  Cardiovascular:     Rate and Rhythm: Normal rate and regular rhythm.     Pulses: Normal pulses.     Heart sounds: Normal heart sounds. No murmur heard.    No friction rub. No gallop.  Pulmonary:     Effort: Pulmonary effort is normal. No respiratory distress.     Breath sounds: Normal breath sounds. No wheezing.  Abdominal:     General: Bowel sounds are normal. There is no distension.     Palpations: Abdomen is soft.     Tenderness: There is no abdominal tenderness. There is no guarding.  Genitourinary:    Exam position: Lithotomy position.     Labia:        Right: No rash, tenderness, lesion or injury.        Left: No rash, tenderness, lesion or injury.      Vagina: Normal.     Cervix: Discharge (thick, white) and cervical bleeding (scant) present.  Musculoskeletal:        General: Normal range of motion.     Cervical back: Normal range of motion and neck supple.  Lymphadenopathy:     Cervical: No cervical adenopathy.  Skin:    General: Skin is warm and dry.  Neurological:     Mental Status: She is alert and oriented to person, place, and time.     Cranial Nerves: No cranial nerve deficit.  Psychiatric:        Attention and Perception: Attention normal.        Mood and Affect: Mood is depressed. Affect is tearful.        Speech: Speech normal.        Behavior: Behavior normal. Behavior is cooperative.        Thought Content: Thought content normal.        Cognition and Memory: Cognition normal.        Judgment: Judgment normal.      No results found for any visits on 01/28/24.      Assessment & Plan:    Routine Health Maintenance and Physical Exam  Immunization History  Administered Date(s) Administered   Influenza,inj,Quad PF,6+ Mos 04/01/2019, 03/31/2020   Influenza-Unspecified 03/31/2020   PFIZER(Purple Top)SARS-COV-2 Vaccination 02/05/2020, 03/12/2020   Tdap 06/12/2016  Health Maintenance  Topic Date Due   Hepatitis B Vaccines 19-59 Average Risk (1 of 3 - 19+ 3-dose series) Never done   COVID-19 Vaccine (3 - Pfizer risk series) 02/13/2024 (Originally 04/09/2020)   INFLUENZA VACCINE  09/09/2024 (Originally 01/11/2024)   Hepatitis C Screening  01/27/2025 (Originally 08/17/1995)   MAMMOGRAM  10/30/2024   DTaP/Tdap/Td (2 - Td or Tdap) 06/12/2026   Cervical Cancer Screening (HPV/Pap Cotest)  10/24/2027   Colonoscopy  05/01/2028   HIV Screening  Completed   Pneumococcal Vaccine  Aged Out   HPV VACCINES  Aged Out   Meningococcal B Vaccine  Aged Out    Discussed health benefits of physical activity, and encouraged her to engage in regular exercise appropriate for her age and condition.  1. Annual physical exam (Primary) Checking labs as below. UTD on preventative care. Wellness information provided with AVS. - CBC - CMP14+EGFR  2. Encounter for IUD removal IUD removal: Procedure note Patient gave verbal consent. Appropriate time out was taken. IUD strings easily visualized from external os.  Strings grasped with ring forceps and with patient giving increased abd pressure, strings pulled and IUD easily removed intact.  Patient tolerated procedure well.  Discussed return precautions.   3. Hypothyroidism, postop Checking labs today. Continue Synthroid , refill sent to [facility].  - TSH+T4F+T3Free+ThyAbs+TPO+VD25 - SYNTHROID  125 MCG tablet; TAKE 1 TABLET FOR 5 DAYS EACH WEEK AND 2 TABLETS 2 DAYS EACH WEEK FOR HYPOTHYROIDISM  Dispense: 108 tablet; Refill: 0  4. Elevated LDL cholesterol level Deferring lipid check today.   Return in about 1 year (around  01/27/2025) for annual physical exam or sooner if needed.   Renalda Locklin, NP

## 2024-01-29 ENCOUNTER — Ambulatory Visit: Payer: Self-pay | Admitting: Medical-Surgical

## 2024-01-29 DIAGNOSIS — E890 Postprocedural hypothyroidism: Secondary | ICD-10-CM

## 2024-01-29 LAB — CMP14+EGFR
ALT: 14 IU/L (ref 0–32)
AST: 18 IU/L (ref 0–40)
Albumin: 4.2 g/dL (ref 3.9–4.9)
Alkaline Phosphatase: 72 IU/L (ref 44–121)
BUN/Creatinine Ratio: 14 (ref 9–23)
BUN: 10 mg/dL (ref 6–24)
Bilirubin Total: 0.6 mg/dL (ref 0.0–1.2)
CO2: 21 mmol/L (ref 20–29)
Calcium: 9.3 mg/dL (ref 8.7–10.2)
Chloride: 100 mmol/L (ref 96–106)
Creatinine, Ser: 0.73 mg/dL (ref 0.57–1.00)
Globulin, Total: 2.4 g/dL (ref 1.5–4.5)
Glucose: 77 mg/dL (ref 70–99)
Potassium: 4.2 mmol/L (ref 3.5–5.2)
Sodium: 136 mmol/L (ref 134–144)
Total Protein: 6.6 g/dL (ref 6.0–8.5)
eGFR: 103 mL/min/1.73 (ref >59)

## 2024-01-29 LAB — TSH+T4F+T3FREE+THYABS+TPO+VD25
Free T4: 2.17 ng/dL — ABNORMAL HIGH (ref 0.82–1.77)
T3, Free: 3.4 pg/mL (ref 2.0–4.4)
TSH: 0.064 u[IU]/mL — ABNORMAL LOW (ref 0.450–4.500)
Thyroglobulin Antibody: <1 [IU]/mL (ref 0.0–0.9)
Thyroperoxidase Ab SerPl-aCnc: 16 [IU]/mL (ref 0–34)
Vit D, 25-Hydroxy: 43.1 ng/mL (ref 30.0–100.0)

## 2024-01-29 LAB — CBC
Hematocrit: 40.1 % (ref 34.0–46.6)
Hemoglobin: 13 g/dL (ref 11.1–15.9)
MCH: 29.3 pg (ref 26.6–33.0)
MCHC: 32.4 g/dL (ref 31.5–35.7)
MCV: 91 fL (ref 79–97)
Platelets: 217 x10E3/uL (ref 150–450)
RBC: 4.43 x10E6/uL (ref 3.77–5.28)
RDW: 12.6 % (ref 11.7–15.4)
WBC: 6.8 x10E3/uL (ref 3.4–10.8)

## 2024-04-06 ENCOUNTER — Other Ambulatory Visit: Payer: Self-pay | Admitting: Medical-Surgical

## 2024-04-06 DIAGNOSIS — E890 Postprocedural hypothyroidism: Secondary | ICD-10-CM

## 2024-04-14 DIAGNOSIS — E890 Postprocedural hypothyroidism: Secondary | ICD-10-CM | POA: Diagnosis not present

## 2024-04-15 LAB — TSH+T4F+T3FREE
Free T4: 1.48 ng/dL (ref 0.82–1.77)
T3, Free: 2.6 pg/mL (ref 2.0–4.4)
TSH: 1.67 u[IU]/mL (ref 0.450–4.500)

## 2024-04-21 ENCOUNTER — Other Ambulatory Visit (HOSPITAL_COMMUNITY): Payer: Self-pay

## 2025-01-28 ENCOUNTER — Encounter: Admitting: Family Medicine
